# Patient Record
Sex: Male | Born: 1935 | Race: White | Hispanic: No | Marital: Married | Smoking: Former smoker
Health system: Southern US, Community
[De-identification: ages and names within clinical notes are randomized; demographics above are authoritative.]

## PROBLEM LIST (undated history)

## (undated) DIAGNOSIS — I10 Essential (primary) hypertension: Secondary | ICD-10-CM

## (undated) DIAGNOSIS — J449 Chronic obstructive pulmonary disease, unspecified: Secondary | ICD-10-CM

## (undated) DIAGNOSIS — F0390 Unspecified dementia without behavioral disturbance: Secondary | ICD-10-CM

## (undated) DIAGNOSIS — C801 Malignant (primary) neoplasm, unspecified: Secondary | ICD-10-CM

## (undated) DIAGNOSIS — C3490 Malignant neoplasm of unspecified part of unspecified bronchus or lung: Secondary | ICD-10-CM

## (undated) DIAGNOSIS — E119 Type 2 diabetes mellitus without complications: Secondary | ICD-10-CM

## (undated) DIAGNOSIS — R1310 Dysphagia, unspecified: Secondary | ICD-10-CM

## (undated) DIAGNOSIS — I4892 Unspecified atrial flutter: Secondary | ICD-10-CM

## (undated) DIAGNOSIS — R4182 Altered mental status, unspecified: Secondary | ICD-10-CM

---

## 2004-01-15 ENCOUNTER — Ambulatory Visit: Payer: Self-pay | Admitting: Oncology

## 2004-03-03 ENCOUNTER — Ambulatory Visit: Payer: Self-pay | Admitting: Oncology

## 2004-09-01 ENCOUNTER — Ambulatory Visit: Payer: Self-pay | Admitting: Oncology

## 2004-09-13 ENCOUNTER — Ambulatory Visit: Payer: Self-pay | Admitting: Oncology

## 2005-03-06 ENCOUNTER — Ambulatory Visit: Payer: Self-pay | Admitting: Oncology

## 2005-03-10 ENCOUNTER — Ambulatory Visit: Payer: Self-pay | Admitting: Oncology

## 2005-09-27 ENCOUNTER — Ambulatory Visit: Payer: Self-pay | Admitting: Oncology

## 2005-12-28 ENCOUNTER — Ambulatory Visit: Payer: Self-pay | Admitting: Oncology

## 2006-01-01 ENCOUNTER — Ambulatory Visit: Payer: Self-pay | Admitting: Oncology

## 2006-05-25 ENCOUNTER — Ambulatory Visit: Payer: Self-pay | Admitting: Gastroenterology

## 2006-06-14 ENCOUNTER — Ambulatory Visit: Payer: Self-pay | Admitting: Oncology

## 2006-07-05 ENCOUNTER — Ambulatory Visit: Payer: Self-pay | Admitting: Oncology

## 2006-07-15 ENCOUNTER — Ambulatory Visit: Payer: Self-pay | Admitting: Oncology

## 2011-12-15 ENCOUNTER — Ambulatory Visit: Payer: Self-pay | Admitting: Hematology and Oncology

## 2011-12-15 LAB — CBC
HCT: 41.6 % (ref 40.0–52.0)
HGB: 13.7 g/dL (ref 13.0–18.0)
MCH: 29.9 pg (ref 26.0–34.0)
MCV: 91 fL (ref 80–100)
Platelet: 243 10*3/uL (ref 150–440)
RBC: 4.57 10*6/uL (ref 4.40–5.90)
RDW: 14.3 % (ref 11.5–14.5)
WBC: 8.7 10*3/uL (ref 3.8–10.6)

## 2011-12-15 LAB — ETHANOL
Ethanol %: <0.003 % (ref 0.000–0.080)
Ethanol: <3 mg/dL

## 2011-12-15 LAB — URINALYSIS, COMPLETE
Blood: NEGATIVE
Ketone: NEGATIVE
Ph: 6 (ref 4.5–8.0)
Protein: NEGATIVE
RBC,UR: 2 /HPF (ref 0–5)
Specific Gravity: 1.014 (ref 1.003–1.030)
Squamous Epithelial: NONE SEEN

## 2011-12-15 LAB — TSH: Thyroid Stimulating Horm: 1.93 u[IU]/mL

## 2011-12-15 LAB — COMPREHENSIVE METABOLIC PANEL
Albumin: 4 g/dL (ref 3.4–5.0)
Anion Gap: 9 (ref 7–16)
BUN: 14 mg/dL (ref 7–18)
Calcium, Total: 9.1 mg/dL (ref 8.5–10.1)
Chloride: 106 mmol/L (ref 98–107)
EGFR (African American): >60
Osmolality: 283 (ref 275–301)
Potassium: 3.7 mmol/L (ref 3.5–5.1)
SGOT(AST): 24 U/L (ref 15–37)

## 2011-12-15 LAB — DRUG SCREEN, URINE
Barbiturates, Ur Screen: NEGATIVE (ref ?–200)
Benzodiazepine, Ur Scrn: NEGATIVE (ref ?–200)
Cocaine Metabolite,Ur ~~LOC~~: NEGATIVE (ref ?–300)
Methadone, Ur Screen: NEGATIVE (ref ?–300)
Tricyclic, Ur Screen: NEGATIVE (ref ?–1000)

## 2011-12-16 ENCOUNTER — Inpatient Hospital Stay: Payer: Self-pay | Admitting: Internal Medicine

## 2012-01-14 ENCOUNTER — Ambulatory Visit: Payer: Self-pay | Admitting: Hematology and Oncology

## 2012-09-20 ENCOUNTER — Inpatient Hospital Stay (HOSPITAL_COMMUNITY)
Admission: EM | Admit: 2012-09-20 | Discharge: 2012-09-27 | DRG: 177 | Disposition: A | Discharge status: Skilled Nursing Facility | Payer: Medicare Other | Attending: Internal Medicine | Admitting: Internal Medicine

## 2012-09-20 ENCOUNTER — Encounter (HOSPITAL_COMMUNITY): Payer: Self-pay | Admitting: Emergency Medicine

## 2012-09-20 ENCOUNTER — Emergency Department (HOSPITAL_COMMUNITY)
Admission: EM | Admit: 2012-09-20 | Discharge: 2012-09-20 | Disposition: A | Discharge status: Home / Self Care | Payer: Medicare Other | Attending: Emergency Medicine | Admitting: Emergency Medicine

## 2012-09-20 ENCOUNTER — Encounter (HOSPITAL_COMMUNITY): Payer: Self-pay

## 2012-09-20 ENCOUNTER — Emergency Department (HOSPITAL_COMMUNITY): Payer: Medicare Other

## 2012-09-20 DIAGNOSIS — Y9210 Unspecified residential institution as the place of occurrence of the external cause: Secondary | ICD-10-CM | POA: Insufficient documentation

## 2012-09-20 DIAGNOSIS — S1093XA Contusion of unspecified part of neck, initial encounter: Secondary | ICD-10-CM | POA: Insufficient documentation

## 2012-09-20 DIAGNOSIS — R627 Adult failure to thrive: Secondary | ICD-10-CM | POA: Diagnosis present

## 2012-09-20 DIAGNOSIS — J690 Pneumonitis due to inhalation of food and vomit: Secondary | ICD-10-CM

## 2012-09-20 DIAGNOSIS — R918 Other nonspecific abnormal finding of lung field: Secondary | ICD-10-CM | POA: Diagnosis present

## 2012-09-20 DIAGNOSIS — W010XXA Fall on same level from slipping, tripping and stumbling without subsequent striking against object, initial encounter: Secondary | ICD-10-CM | POA: Diagnosis present

## 2012-09-20 DIAGNOSIS — Y92129 Unspecified place in nursing home as the place of occurrence of the external cause: Secondary | ICD-10-CM

## 2012-09-20 DIAGNOSIS — J189 Pneumonia, unspecified organism: Secondary | ICD-10-CM | POA: Diagnosis present

## 2012-09-20 DIAGNOSIS — I10 Essential (primary) hypertension: Secondary | ICD-10-CM | POA: Insufficient documentation

## 2012-09-20 DIAGNOSIS — D649 Anemia, unspecified: Secondary | ICD-10-CM | POA: Diagnosis present

## 2012-09-20 DIAGNOSIS — F03918 Unspecified dementia, unspecified severity, with other behavioral disturbance: Secondary | ICD-10-CM | POA: Diagnosis present

## 2012-09-20 DIAGNOSIS — E119 Type 2 diabetes mellitus without complications: Secondary | ICD-10-CM | POA: Diagnosis present

## 2012-09-20 DIAGNOSIS — S0003XA Contusion of scalp, initial encounter: Secondary | ICD-10-CM | POA: Insufficient documentation

## 2012-09-20 DIAGNOSIS — Z8659 Personal history of other mental and behavioral disorders: Secondary | ICD-10-CM | POA: Insufficient documentation

## 2012-09-20 DIAGNOSIS — R0603 Acute respiratory distress: Secondary | ICD-10-CM | POA: Diagnosis present

## 2012-09-20 DIAGNOSIS — J9600 Acute respiratory failure, unspecified whether with hypoxia or hypercapnia: Secondary | ICD-10-CM | POA: Diagnosis present

## 2012-09-20 DIAGNOSIS — E1140 Type 2 diabetes mellitus with diabetic neuropathy, unspecified: Secondary | ICD-10-CM | POA: Diagnosis present

## 2012-09-20 DIAGNOSIS — J4489 Other specified chronic obstructive pulmonary disease: Secondary | ICD-10-CM | POA: Insufficient documentation

## 2012-09-20 DIAGNOSIS — Z87891 Personal history of nicotine dependence: Secondary | ICD-10-CM

## 2012-09-20 DIAGNOSIS — J9601 Acute respiratory failure with hypoxia: Secondary | ICD-10-CM | POA: Diagnosis present

## 2012-09-20 DIAGNOSIS — T380X5A Adverse effect of glucocorticoids and synthetic analogues, initial encounter: Secondary | ICD-10-CM | POA: Diagnosis present

## 2012-09-20 DIAGNOSIS — Y9389 Activity, other specified: Secondary | ICD-10-CM | POA: Insufficient documentation

## 2012-09-20 DIAGNOSIS — S0083XA Contusion of other part of head, initial encounter: Secondary | ICD-10-CM

## 2012-09-20 DIAGNOSIS — D638 Anemia in other chronic diseases classified elsewhere: Secondary | ICD-10-CM | POA: Diagnosis present

## 2012-09-20 DIAGNOSIS — E1149 Type 2 diabetes mellitus with other diabetic neurological complication: Secondary | ICD-10-CM | POA: Diagnosis present

## 2012-09-20 DIAGNOSIS — E876 Hypokalemia: Secondary | ICD-10-CM | POA: Diagnosis present

## 2012-09-20 DIAGNOSIS — R0902 Hypoxemia: Secondary | ICD-10-CM | POA: Insufficient documentation

## 2012-09-20 DIAGNOSIS — J449 Chronic obstructive pulmonary disease, unspecified: Secondary | ICD-10-CM | POA: Insufficient documentation

## 2012-09-20 DIAGNOSIS — Z85118 Personal history of other malignant neoplasm of bronchus and lung: Secondary | ICD-10-CM | POA: Insufficient documentation

## 2012-09-20 DIAGNOSIS — D72829 Elevated white blood cell count, unspecified: Secondary | ICD-10-CM | POA: Diagnosis present

## 2012-09-20 DIAGNOSIS — F0391 Unspecified dementia with behavioral disturbance: Secondary | ICD-10-CM | POA: Diagnosis present

## 2012-09-20 DIAGNOSIS — F0390 Unspecified dementia without behavioral disturbance: Secondary | ICD-10-CM | POA: Insufficient documentation

## 2012-09-20 DIAGNOSIS — W07XXXA Fall from chair, initial encounter: Secondary | ICD-10-CM | POA: Insufficient documentation

## 2012-09-20 DIAGNOSIS — E43 Unspecified severe protein-calorie malnutrition: Secondary | ICD-10-CM | POA: Diagnosis present

## 2012-09-20 DIAGNOSIS — C3410 Malignant neoplasm of upper lobe, unspecified bronchus or lung: Secondary | ICD-10-CM | POA: Diagnosis present

## 2012-09-20 DIAGNOSIS — E1142 Type 2 diabetes mellitus with diabetic polyneuropathy: Secondary | ICD-10-CM | POA: Diagnosis present

## 2012-09-20 DIAGNOSIS — Z66 Do not resuscitate: Secondary | ICD-10-CM | POA: Diagnosis present

## 2012-09-20 HISTORY — DX: Essential (primary) hypertension: I10

## 2012-09-20 HISTORY — DX: Malignant neoplasm of unspecified part of unspecified bronchus or lung: C34.90

## 2012-09-20 HISTORY — DX: Type 2 diabetes mellitus without complications: E11.9

## 2012-09-20 HISTORY — DX: Malignant (primary) neoplasm, unspecified: C80.1

## 2012-09-20 HISTORY — DX: Chronic obstructive pulmonary disease, unspecified: J44.9

## 2012-09-20 HISTORY — DX: Altered mental status, unspecified: R41.82

## 2012-09-20 LAB — CBC WITH DIFFERENTIAL/PLATELET
Basophils Absolute: 0 10*3/uL (ref 0.0–0.1)
Basophils Absolute: 0 10*3/uL (ref 0.0–0.1)
Basophils Relative: 0 % (ref 0–1)
Basophils Relative: 0 % (ref 0–1)
Basophils Relative: 0 % (ref 0–1)
Eosinophils Absolute: 0 10*3/uL (ref 0.0–0.7)
HCT: 33.7 % — ABNORMAL LOW (ref 39.0–52.0)
Hemoglobin: 11.7 g/dL — ABNORMAL LOW (ref 13.0–17.0)
Hemoglobin: 11.4 g/dL — ABNORMAL LOW (ref 13.0–17.0)
Hemoglobin: 11.5 g/dL — ABNORMAL LOW (ref 13.0–17.0)
Lymphocytes Relative: 4 % — ABNORMAL LOW (ref 12–46)
Lymphs Abs: 1.3 10*3/uL (ref 0.7–4.0)
MCH: 30.7 pg (ref 26.0–34.0)
MCHC: 34.7 g/dL (ref 30.0–36.0)
MCHC: 34.3 g/dL (ref 30.0–36.0)
MCHC: 34.1 g/dL (ref 30.0–36.0)
Monocytes Absolute: 2.3 10*3/uL — ABNORMAL HIGH (ref 0.1–1.0)
Monocytes Relative: 12 % (ref 3–12)
Monocytes Relative: 11 % (ref 3–12)
Neutro Abs: 12.2 10*3/uL — ABNORMAL HIGH (ref 1.7–7.7)
Neutro Abs: 15.8 10*3/uL — ABNORMAL HIGH (ref 1.7–7.7)
Neutrophils Relative %: 84 % — ABNORMAL HIGH (ref 43–77)
Neutrophils Relative %: 84 % — ABNORMAL HIGH (ref 43–77)
RBC: 3.74 MIL/uL — ABNORMAL LOW (ref 4.22–5.81)
RDW: 13.8 % (ref 11.5–15.5)
WBC: 18.8 10*3/uL — ABNORMAL HIGH (ref 4.0–10.5)

## 2012-09-20 LAB — URINE MICROSCOPIC-ADD ON

## 2012-09-20 LAB — POCT I-STAT TROPONIN I: Troponin i, poc: 0.08 ng/mL (ref 0.00–0.08)

## 2012-09-20 LAB — COMPREHENSIVE METABOLIC PANEL
ALT: 19 U/L (ref 0–53)
ALT: 18 U/L (ref 0–53)
AST: 19 U/L (ref 0–37)
AST: 18 U/L (ref 0–37)
Albumin: 2.4 g/dL — ABNORMAL LOW (ref 3.5–5.2)
Albumin: 2.4 g/dL — ABNORMAL LOW (ref 3.5–5.2)
Alkaline Phosphatase: 91 U/L (ref 39–117)
Alkaline Phosphatase: 92 U/L (ref 39–117)
BUN: 15 mg/dL (ref 6–23)
CO2: 33 mEq/L — ABNORMAL HIGH (ref 19–32)
Calcium: 8.8 mg/dL (ref 8.4–10.5)
Calcium: 8.9 mg/dL (ref 8.4–10.5)
Chloride: 96 mEq/L (ref 96–112)
Creatinine, Ser: 0.72 mg/dL (ref 0.50–1.35)
GFR calc Af Amer: >90 mL/min (ref >90)
GFR calc non Af Amer: 86 mL/min — ABNORMAL LOW (ref >90)
Glucose, Bld: 207 mg/dL — ABNORMAL HIGH (ref 70–99)
Glucose, Bld: 158 mg/dL — ABNORMAL HIGH (ref 70–99)
Potassium: 3.9 mEq/L (ref 3.5–5.1)
Potassium: 3.6 mEq/L (ref 3.5–5.1)
Sodium: 137 mEq/L (ref 135–145)
Sodium: 138 mEq/L (ref 135–145)
Total Bilirubin: 0.3 mg/dL (ref 0.3–1.2)
Total Protein: 6.5 g/dL (ref 6.0–8.3)
Total Protein: 6.6 g/dL (ref 6.0–8.3)

## 2012-09-20 LAB — PHOSPHORUS: Phosphorus: 2.9 mg/dL (ref 2.3–4.6)

## 2012-09-20 LAB — URINALYSIS, ROUTINE W REFLEX MICROSCOPIC
Glucose, UA: NEGATIVE mg/dL
Ketones, ur: NEGATIVE mg/dL
Leukocytes, UA: NEGATIVE
Nitrite: NEGATIVE
Protein, ur: NEGATIVE mg/dL
Specific Gravity, Urine: 1.024 (ref 1.005–1.030)
pH: 5 (ref 5.0–8.0)

## 2012-09-20 LAB — PROTIME-INR
INR: 1.28 (ref 0.00–1.49)
Prothrombin Time: 15.7 seconds — ABNORMAL HIGH (ref 11.6–15.2)

## 2012-09-20 LAB — BASIC METABOLIC PANEL
Chloride: 95 mEq/L — ABNORMAL LOW (ref 96–112)
GFR calc Af Amer: >90 mL/min (ref >90)
Potassium: 3.3 mEq/L — ABNORMAL LOW (ref 3.5–5.1)

## 2012-09-20 MED ORDER — DEXTROSE 5 % IV SOLN
1.0000 g | Freq: Three times a day (TID) | INTRAVENOUS | Status: DC
  Administered 2012-09-20: 1 g via INTRAVENOUS
  Filled 2012-09-20: qty 1

## 2012-09-20 MED ORDER — FELODIPINE ER 5 MG PO TB24
5.0000 mg | ORAL_TABLET | Freq: Every day | ORAL | Status: DC
  Administered 2012-09-22 – 2012-09-27 (×6): 5 mg via ORAL
  Filled 2012-09-20 (×7): qty 1

## 2012-09-20 MED ORDER — RISAQUAD PO CAPS
1.0000 | ORAL_CAPSULE | Freq: Every day | ORAL | Status: DC
  Administered 2012-09-21 – 2012-09-27 (×6): 1 via ORAL
  Filled 2012-09-20 (×8): qty 1

## 2012-09-20 MED ORDER — MAGNESIUM HYDROXIDE 400 MG/5ML PO SUSP: 30.0000 mL | Freq: Every day | ORAL | Status: DC | PRN

## 2012-09-20 MED ORDER — DEXTROSE 5 % IV SOLN
1.0000 g | Freq: Three times a day (TID) | INTRAVENOUS | Status: DC
  Administered 2012-09-21 – 2012-09-27 (×20): 1 g via INTRAVENOUS
  Filled 2012-09-20 (×23): qty 1

## 2012-09-20 MED ORDER — DONEPEZIL HCL 10 MG PO TABS
10.0000 mg | ORAL_TABLET | Freq: Every day | ORAL | Status: DC
  Administered 2012-09-20 – 2012-09-26 (×7): 10 mg via ORAL
  Filled 2012-09-20 (×8): qty 1

## 2012-09-20 MED ORDER — GUAIFENESIN 100 MG/5ML PO SOLN: 200.0000 mg | Freq: Three times a day (TID) | ORAL | Status: DC | PRN

## 2012-09-20 MED ORDER — HYDROCHLOROTHIAZIDE 25 MG PO TABS
25.0000 mg | ORAL_TABLET | Freq: Every day | ORAL | Status: DC
  Administered 2012-09-21 – 2012-09-27 (×7): 25 mg via ORAL
  Filled 2012-09-20 (×7): qty 1

## 2012-09-20 MED ORDER — SODIUM CHLORIDE 0.9 % IV SOLN
INTRAVENOUS | Status: DC
  Administered 2012-09-20 (×2): via INTRAVENOUS

## 2012-09-20 MED ORDER — ACETAMINOPHEN 650 MG RE SUPP: 650.0000 mg | Freq: Four times a day (QID) | RECTAL | Status: DC | PRN

## 2012-09-20 MED ORDER — ASPIRIN 81 MG PO CHEW
81.0000 mg | CHEWABLE_TABLET | Freq: Every day | ORAL | Status: DC
  Administered 2012-09-21 – 2012-09-27 (×7): 81 mg via ORAL
  Filled 2012-09-20 (×7): qty 1

## 2012-09-20 MED ORDER — VANCOMYCIN HCL IN DEXTROSE 1-5 GM/200ML-% IV SOLN
1000.0000 mg | Freq: Once | INTRAVENOUS | Status: AC
  Administered 2012-09-20: 1000 mg via INTRAVENOUS
  Filled 2012-09-20: qty 200

## 2012-09-20 MED ORDER — ALBUTEROL SULFATE (5 MG/ML) 0.5% IN NEBU: 2.5000 mg | INHALATION_SOLUTION | RESPIRATORY_TRACT | Status: DC | PRN

## 2012-09-20 MED ORDER — MEMANTINE HCL 5 MG PO TABS
5.0000 mg | ORAL_TABLET | Freq: Two times a day (BID) | ORAL | Status: DC
  Administered 2012-09-20 – 2012-09-27 (×13): 5 mg via ORAL
  Filled 2012-09-20 (×15): qty 1

## 2012-09-20 MED ORDER — DEXTROSE 5 % IV SOLN
500.0000 mg | INTRAVENOUS | Status: DC
  Administered 2012-09-20: 500 mg via INTRAVENOUS
  Filled 2012-09-20: qty 500

## 2012-09-20 MED ORDER — ALBUTEROL SULFATE HFA 108 (90 BASE) MCG/ACT IN AERS: 1.0000 | INHALATION_SPRAY | RESPIRATORY_TRACT | Status: DC | PRN

## 2012-09-20 MED ORDER — ONDANSETRON HCL 4 MG PO TABS: 4.0000 mg | ORAL_TABLET | Freq: Four times a day (QID) | ORAL | Status: DC | PRN

## 2012-09-20 MED ORDER — HALOPERIDOL 0.5 MG PO TABS
0.5000 mg | ORAL_TABLET | Freq: Two times a day (BID) | ORAL | Status: DC
  Administered 2012-09-20 – 2012-09-27 (×13): 0.5 mg via ORAL
  Filled 2012-09-20 (×17): qty 1

## 2012-09-20 MED ORDER — LOPERAMIDE HCL 2 MG PO CAPS: 2.0000 mg | ORAL_CAPSULE | Freq: Four times a day (QID) | ORAL | Status: DC | PRN

## 2012-09-20 MED ORDER — CITALOPRAM HYDROBROMIDE 20 MG PO TABS
20.0000 mg | ORAL_TABLET | Freq: Every day | ORAL | Status: DC
  Administered 2012-09-21 – 2012-09-27 (×7): 20 mg via ORAL
  Filled 2012-09-20 (×7): qty 1

## 2012-09-20 MED ORDER — ONDANSETRON HCL 4 MG/2ML IJ SOLN: 4.0000 mg | Freq: Four times a day (QID) | INTRAMUSCULAR | Status: DC | PRN

## 2012-09-20 MED ORDER — IPRATROPIUM BROMIDE 0.02 % IN SOLN: 0.5000 mg | RESPIRATORY_TRACT | Status: DC | PRN

## 2012-09-20 MED ORDER — ALBUTEROL SULFATE HFA 108 (90 BASE) MCG/ACT IN AERS
1.0000 | INHALATION_SPRAY | RESPIRATORY_TRACT | Status: DC | PRN
  Administered 2012-09-20: 2 via RESPIRATORY_TRACT
  Filled 2012-09-20: qty 6.7

## 2012-09-20 MED ORDER — ACETAMINOPHEN 325 MG PO TABS: 650.0000 mg | ORAL_TABLET | Freq: Four times a day (QID) | ORAL | Status: DC | PRN

## 2012-09-20 MED ORDER — METFORMIN HCL 500 MG PO TABS
500.0000 mg | ORAL_TABLET | Freq: Every day | ORAL | Status: DC
  Administered 2012-09-21 – 2012-09-27 (×2): 500 mg via ORAL
  Filled 2012-09-20 (×8): qty 1

## 2012-09-20 MED ORDER — HYDROCODONE-ACETAMINOPHEN 5-325 MG PO TABS
1.0000 | ORAL_TABLET | ORAL | Status: DC | PRN
  Administered 2012-09-20: 1 via ORAL
  Administered 2012-09-21: 2 via ORAL
  Filled 2012-09-20: qty 2
  Filled 2012-09-20: qty 1

## 2012-09-20 MED ORDER — METHYLPREDNISOLONE SODIUM SUCC 125 MG IJ SOLR
60.0000 mg | Freq: Every day | INTRAMUSCULAR | Status: DC
  Administered 2012-09-20 – 2012-09-21 (×2): 60 mg via INTRAVENOUS
  Filled 2012-09-20 (×2): qty 0.96

## 2012-09-20 MED ORDER — MEMANTINE HCL ER 14 MG PO CP24: 14.0000 mg | ORAL_CAPSULE | Freq: Every day | ORAL | Status: DC

## 2012-09-20 MED ORDER — DEXTROSE 5 % IV SOLN
1.0000 g | INTRAVENOUS | Status: DC
  Administered 2012-09-20: 1 g via INTRAVENOUS
  Filled 2012-09-20: qty 10

## 2012-09-20 MED ORDER — DIVALPROEX SODIUM ER 500 MG PO TB24
500.0000 mg | ORAL_TABLET | Freq: Two times a day (BID) | ORAL | Status: DC
  Administered 2012-09-20 – 2012-09-25 (×10): 500 mg via ORAL
  Filled 2012-09-20 (×13): qty 1

## 2012-09-20 MED ORDER — SODIUM CHLORIDE 0.9 % IV SOLN
INTRAVENOUS | Status: DC
  Administered 2012-09-20 – 2012-09-21 (×2): 75 mL/h via INTRAVENOUS
  Administered 2012-09-21: 14:00:00 via INTRAVENOUS

## 2012-09-20 MED ORDER — VANCOMYCIN HCL IN DEXTROSE 1-5 GM/200ML-% IV SOLN
1000.0000 mg | Freq: Two times a day (BID) | INTRAVENOUS | Status: DC
  Administered 2012-09-21 – 2012-09-27 (×13): 1000 mg via INTRAVENOUS
  Filled 2012-09-20 (×15): qty 200

## 2012-09-20 MED ORDER — GABAPENTIN 100 MG PO CAPS
100.0000 mg | ORAL_CAPSULE | Freq: Three times a day (TID) | ORAL | Status: DC
  Administered 2012-09-20 – 2012-09-27 (×19): 100 mg via ORAL
  Filled 2012-09-20 (×23): qty 1

## 2012-09-20 MED ORDER — ACETAMINOPHEN 325 MG PO TABS
650.0000 mg | ORAL_TABLET | Freq: Once | ORAL | Status: AC
  Administered 2012-09-20: 650 mg via ORAL
  Filled 2012-09-20: qty 2

## 2012-09-20 MED ORDER — TETANUS-DIPHTH-ACELL PERTUSSIS 5-2.5-18.5 LF-MCG/0.5 IM SUSP: 0.5000 mL | Freq: Once | INTRAMUSCULAR | Status: DC

## 2012-09-20 NOTE — Progress Notes (Signed)
ANTIBIOTIC CONSULT NOTE - INITIAL  Pharmacy Consult for:  Vancomycin Indication:  Suspected pneumonia (HCAP)  Allergies  Allergen Reactions  . Ativan (Lorazepam)     Patient Measurements: Height: 5\' 9"  (175.3 cm) Weight: 162 lb 14.7 oz (73.9 kg) IBW/kg (Calculated) : 70.7  Vital Signs: Temp: 99 F (37.2 C) (08/08 2044) Temp src: Oral (08/08 2044) BP: 137/59 mmHg (08/08 2044) Pulse Rate: 98 (08/08 2044)   Labs:  Recent Labs  09/20/12 0410 09/20/12 1510 09/20/12 2010  WBC 14.5* 18.8* 20.9*  HGB 11.7* 11.4* 11.5*  PLT 410* 453* 433*  CREATININE 0.82 0.83 0.72   Estimated Creatinine Clearance: 73.6 ml/min (by C-G formula based on Cr of 0.72).    Medical History: Past Medical History  Diagnosis Date  . Altered mental status   . COPD (chronic obstructive pulmonary disease)   . Diabetes mellitus without complication   . Hypertension   . Cancer   . Lung cancer     Medications:  Scheduled:  . [START ON 09/21/2012] acidophilus  1 capsule Oral Daily  . [START ON 09/21/2012] aspirin  81 mg Oral Daily  . [START ON 09/21/2012] citalopram  20 mg Oral Daily  . divalproex  500 mg Oral BID  . donepezil  10 mg Oral QHS  . [START ON 09/21/2012] felodipine  5 mg Oral Daily  . gabapentin  100 mg Oral TID  . haloperidol  0.5 mg Oral BID  . [START ON 09/21/2012] hydrochlorothiazide  25 mg Oral Daily  . memantine  5 mg Oral BID  . [START ON 09/21/2012] metFORMIN  500 mg Oral Q breakfast  . methylPREDNISolone (SOLU-MEDROL) injection  60 mg Intravenous Daily   Assessment: Asked to assist with Vancomycin therapy for this 79 year-old male with suspected healthcare-associated pneumonia.  Goal of Therapy:  Vancomycin trough levels 15-20 mcg/ml Eradication of infection  Plan:   Vancomycin 1000 mg IV every 12 hours  Levels as needed to guide dosing decisions   [employer] R.Ph. 09/20/2012 9:16 PM

## 2012-09-20 NOTE — ED Notes (Addendum)
Report called to facility

## 2012-09-20 NOTE — H&P (Signed)
[facility] History and Physical  Joel Summers ZOX:[id number] DOB: January 10, 1931 DOA: 09/20/2012  Referring physician: ER physician PCP: Ron Parker, MD   Chief Complaint: shortness of breath  HPI:  79 year old male with past medical history of hypertension, dementia, COPD, diabetes who presented to [facility] ED from SNF with worsening shortness of breath with productive cough. Patient is unable to provide the details of his medical history due to dementia. No reports of fevers. No reports of vomiting, blood in stool or urine. No reports of hematemesis or weight loss. In ED, vital signs are stable with BP 135/94, O2 saturation 97% on room air and T 99.6 F. CXR revealed right upper lung lobe mass consistent with history of lung cancer. No preevios CXR available in EPIC for comparison. No previous records of history of lung cancer or treatments. CBC revealed WBC count of 14.5 and 18.8, hemoglobin of 11.7. BMP revealed potassium of 3.3 which normalized spontaneously to 3.9. CT head and cervical spine showed no acute intracranial findings.   Assessment and Plan:  Principal Problem:   *Acute respiratory distress - not hypoxic, saturation is 97% on room air but he does appear to have mild shortness of breath - likely due to HCAP or aspiration pneumonia - start vanco and cefepime - start albuterol and Atrovent nebulizers as needed every 2 hours - solumedrol 60 mg daily IV - oxygen support via nasal canula to keep O2 saturation above 90%  COPD (chronic obstructive pulmonary disease) - COPD order set in place - management as above with steroids, nebulizer treatments and antibiotics   HCAP (healthcare-associated pneumonia) - pneumonia order set in place - follow up blood and resp culture results, legionella and strep pneumonia - continue vanco and cefepime Active Problems:   Anemia - stable hemoglobin at 11.7 - no indications for transfusion   Hypokalemia - resolved to 3.9 without  supplementation    Leukocytosis - likely due to steroids and pneumonia - continue to monitor   Dementia - continue aricept and namenda   Lung mass - right upper lung lobe - will consult oncology in am   DM (diabetes mellitus) - continue metformin 500 mg daily   HTN (hypertension) - continue Hctz   Manson Passey [facility] [phone]  Review of Systems:  Unable to obtain due to dementia.   Past Medical History  Diagnosis Date  . Altered mental status   . COPD (chronic obstructive pulmonary disease)   . Diabetes mellitus without complication   . Hypertension   . Cancer   . Lung cancer    History reviewed. No pertinent past surgical history. Social History:  reports that he does not drink alcohol. His tobacco and drug histories are not on file.  Allergies  Allergen Reactions  . Ativan (Lorazepam)     Family History: unable to obtain due to patient's dementia  Prior to Admission medications   Medication Sig Start Date End Date Taking? Authorizing Provider  acetaminophen (TYLENOL) 500 MG tablet Take 500 mg by mouth every 6 (six) hours as needed for pain.   Yes Historical Provider, MD  acidophilus (RISAQUAD) CAPS capsule Take 1 capsule by mouth daily. For 20 days   Yes Historical Provider, MD  albuterol (PROVENTIL HFA;VENTOLIN HFA) 108 (90 BASE) MCG/ACT inhaler Inhale 1-2 puffs into the lungs every 4 (four) hours as needed for wheezing or shortness of breath. 09/20/12  Yes Olivia Mackie, MD  albuterol (PROVENTIL) 2 MG tablet Take 2 mg by mouth 3 (three) times daily.  For 5 days   Yes Historical Provider, MD  amoxicillin-clavulanate (AUGMENTIN) 500-125 MG per tablet Take 1 tablet by mouth 3 (three) times daily. For 10 days only   Yes Historical Provider, MD  aspirin 81 MG chewable tablet Chew 81 mg by mouth daily.   Yes Historical Provider, MD  citalopram (CELEXA) 20 MG tablet Take 20 mg by mouth daily.   Yes Historical Provider, MD  divalproex (DEPAKOTE ER) 500 MG 24 hr tablet Take 500  mg by mouth 2 (two) times daily.   Yes Historical Provider, MD  donepezil (ARICEPT) 10 MG tablet Take 10 mg by mouth at bedtime.   Yes Historical Provider, MD  felodipine (PLENDIL) 5 MG 24 hr tablet Take 5 mg by mouth daily.   Yes Historical Provider, MD  gabapentin (NEURONTIN) 100 MG capsule Take 100 mg by mouth 3 (three) times daily.   Yes Historical Provider, MD  guaiFENesin (ROBITUSSIN) 100 MG/5ML liquid Take 200 mg by mouth 3 (three) times daily as needed for cough.   Yes Historical Provider, MD  haloperidol (HALDOL) 0.5 MG tablet Take 0.5 mg by mouth 2 (two) times daily.   Yes Historical Provider, MD  hydrochlorothiazide (HYDRODIURIL) 25 MG tablet Take 25 mg by mouth daily.   Yes Historical Provider, MD  levofloxacin (LEVAQUIN) 500 MG tablet Take 500 mg by mouth daily. For 10 days only   Yes Historical Provider, MD  loperamide (IMODIUM A-D) 2 MG tablet Take 2 mg by mouth 4 (four) times daily as needed for diarrhea or loose stools.   Yes Historical Provider, MD  magnesium hydroxide (MILK OF MAGNESIA) 400 MG/5ML suspension Take 30 mLs by mouth daily as needed for constipation.   Yes Historical Provider, MD  Memantine HCl ER (NAMENDA XR) 14 MG CP24 Take by mouth.   Yes Historical Provider, MD  metFORMIN (GLUCOPHAGE) 500 MG tablet Take 500 mg by mouth daily with breakfast.   Yes Historical Provider, MD  predniSONE (DELTASONE) 20 MG tablet Take 20 mg by mouth daily. For 5 days   Yes Historical Provider, MD   Physical Exam: Filed Vitals:   09/20/12 1402 09/20/12 1706  BP: 120/42 117/69  Pulse: 88 101  Temp: 99.6 F (37.6 C)   TempSrc: Oral   Resp: 21 26  SpO2: 92% 93%    Physical Exam  Constitutional: Appears ill. No distress.  HENT: Normocephalic. Dry mucus membranes Eyes: Conjunctivae and EOM are normal. PERRLA, no scleral icterus.  Neck: Normal ROM. Neck supple. No JVD. No tracheal deviation. No thyromegaly.  CVS: RRR, S1/S2 appreciated Pulmonary: diminished breath sounds  bilateral, rhonchi in right upper lung lobe Abdominal: Soft. BS +,  no distension, tenderness, rebound or guarding.  Musculoskeletal: Normal range of motion. No edema and no tenderness.  Lymphadenopathy: No lymphadenopathy noted, cervical, inguinal. Neuro: Alert. Not oriented, no focal neurologic deficits Skin: Skin is warm and dry.     Labs on Admission:  Basic Metabolic Panel:  Recent Labs Lab 09/20/12 0410 09/20/12 1510  NA 136 137  K 3.3* 3.9  CL 95* 95*  CO2 30 31  GLUCOSE 188* 207*  BUN 18 18  CREATININE 0.82 0.83  CALCIUM 8.8 8.8   Liver Function Tests:  Recent Labs Lab 09/20/12 1510  AST 19  ALT 19  ALKPHOS 91  BILITOT 0.5  PROT 6.5  ALBUMIN 2.4*   No results found for this basename: LIPASE, AMYLASE,  in the last 168 hours No results found for this basename: AMMONIA,  in the  last 168 hours CBC:  Recent Labs Lab 09/20/12 0410 09/20/12 1510  WBC 14.5* 18.8*  NEUTROABS 12.2* 15.8*  HGB 11.7* 11.4*  HCT 33.7* 33.2*  MCV 89.2 89.5  PLT 410* 453*   Cardiac Enzymes: No results found for this basename: CKTOTAL, CKMB, CKMBINDEX, TROPONINI,  in the last 168 hours BNP: No components found with this basename: POCBNP,  CBG: No results found for this basename: GLUCAP,  in the last 168 hours  Radiological Exams on Admission: Dg Chest 2 View  09/20/2012   *RADIOLOGY REPORT*  Clinical Data: Fall, weakness, unable to walk well, the kidney, cough, history lung cancer, hypertension, COPD  CHEST - 2 VIEW  Comparison: Exam at 1603 hours compared to earlier study of 0425 hours  Findings: Mild volume loss in right hemithorax again noted. Enlargement of cardiac silhouette with tortuous aorta. Pulmonary vascular congestion. Right perihilar opacity compatible with tumor and post-treatment changes. Bibasilar atelectasis versus infiltrates with small right pleural effusion. No pneumothorax. Bones demineralized.  IMPRESSION: Right perihilar opacity consistent with lung cancer and  post- therapy changes. COPD changes with bibasilar atelectasis versus infiltrates and small right pleural effusion.   Original Report Authenticated By: Ulyses Southward, M.D.   Dg Chest 2 View  09/20/2012   *RADIOLOGY REPORT*  Clinical Data: Status post fall.  Chest pain.  History of lung cancer.  CHEST - 2 VIEW  Comparison: None.  Findings: There is volume loss in the right chest with a mass-like opacity in the right upper lobe.  Blunting of the right costophrenic angle could be due to a small effusion or scar. Surgical clips right hilum noted.  The left lung appears clear. Heart size is normal.  IMPRESSION: Post-treatment change right chest with a mass-like opacity right upper lobe compatible with history of lung cancer.  Small right pleural effusion.   Original Report Authenticated By: Holley Dexter, M.D.   Ct Head Wo Contrast  09/20/2012   *RADIOLOGY REPORT*  Clinical Data:  Status post fall with a blow to the head.  CT HEAD WITHOUT CONTRAST CT CERVICAL SPINE WITHOUT CONTRAST  Technique:  Multidetector CT imaging of the head and cervical spine was performed following the standard protocol without intravenous contrast.  Multiplanar CT image reconstructions of the cervical spine were also generated.  Comparison:  None.  CT HEAD  Findings: The brain is atrophic with chronic microvascular ischemic change.  No acute intracranial abnormality including infarction, hemorrhage, mass lesion, mass effect, midline shift or abnormal extra-axial fluid collection is identified.  Hematoma over the frontal bone is noted.  No fracture is seen.  IMPRESSION:  1.  Hematoma over the frontal bone without underlying fracture or for acute intracranial abnormality. 2.  Atrophy and chronic microvascular ischemic change.  CT CERVICAL SPINE  Findings: No fracture or subluxation of the cervical spine is identified. Loss of disc space height and endplate spurring are most notable at C5-6 and C6-7.  Multilevel facet degenerative change is  worse on the left.  Paraspinous soft tissue structures are unremarkable.  Apical scarring on the right is noted.  IMPRESSION: No acute finding.  Degenerative disc disease C5-6 and C6-7.   Original Report Authenticated By: Holley Dexter, M.D.   Ct Cervical Spine Wo Contrast  09/20/2012   *RADIOLOGY REPORT*  Clinical Data:  Status post fall with a blow to the head.  CT HEAD WITHOUT CONTRAST CT CERVICAL SPINE WITHOUT CONTRAST  Technique:  Multidetector CT imaging of the head and cervical spine was performed following the standard  protocol without intravenous contrast.  Multiplanar CT image reconstructions of the cervical spine were also generated.  Comparison:  None.  CT HEAD  Findings: The brain is atrophic with chronic microvascular ischemic change.  No acute intracranial abnormality including infarction, hemorrhage, mass lesion, mass effect, midline shift or abnormal extra-axial fluid collection is identified.  Hematoma over the frontal bone is noted.  No fracture is seen.  IMPRESSION:  1.  Hematoma over the frontal bone without underlying fracture or for acute intracranial abnormality. 2.  Atrophy and chronic microvascular ischemic change.  CT CERVICAL SPINE  Findings: No fracture or subluxation of the cervical spine is identified. Loss of disc space height and endplate spurring are most notable at C5-6 and C6-7.  Multilevel facet degenerative change is worse on the left.  Paraspinous soft tissue structures are unremarkable.  Apical scarring on the right is noted.  IMPRESSION: No acute finding.  Degenerative disc disease C5-6 and C6-7.   Original Report Authenticated By: Holley Dexter, M.D.    Code Status: Full Family Communication: Pt at bedside Disposition Plan: Admit for further evaluation  Manson Passey, MD  [facility] Pager [phone]  If 7PM-7AM, please contact night-coverage [email or web address] Password [email or web address] 09/20/2012, 5:32 PM

## 2012-09-20 NOTE — ED Notes (Signed)
ZOX:[id number]> Expected date:<BR> Expected time:<BR> Means of arrival:<BR> Comments:<BR> EMS 79yo M; fall out of bed, struck head; hematoma to head; Shob, fever

## 2012-09-20 NOTE — ED Notes (Signed)
[id number]<JY> Expected date:<BR> Expected time:<BR> Means of arrival:<BR> Comments:<BR> Ems/

## 2012-09-20 NOTE — Progress Notes (Signed)
Received patient from ED at around 1830.  Upon arrival, the transporting NT stated that the patient pulled out his IV during the transport.  Patient with a large hematoma to the entire top of his left hand.  No active bleeding noted.  Dr. Izola Price on the unit and notified and she stated she would make Dr. Elisabeth Pigeon aware and she stated to apply ice.  Patient's IV restarted in right arm.  Patient extremely confused and attempting to get OOB.  Bed alarm and other high fall risk prevention measures in place.  Allayne Butcher [facility]  09/20/2012  7:35 PM

## 2012-09-20 NOTE — ED Notes (Signed)
Patient fell 0230 this morning and came to [facility] then got discharge back to [facility] nursing facility with antibiotic. Pt fell again for the second time at the nursing home. Feels weak, unable to walk well, urinated on himself, incontinence is new today. Temp was 99.6. Sat 85% RA, pt placed on oxygen. Pt has no complaint. Hematoma on left forehead with abrasion. Tachypnea, and coughing noted. Lungs are clear

## 2012-09-20 NOTE — ED Notes (Signed)
Pt slid out of chair this morning and fell face forward denies loc. Pt has hematoma to center of forehead. Pt tachycardiac with difficulty breathing.

## 2012-09-20 NOTE — ED Provider Notes (Signed)
CSN: [id number]     Arrival date & time 09/20/12  1339 History     First MD Initiated Contact with Patient 09/20/12 1502     Chief Complaint  Patient presents with  . Fall   (Consider location/radiation/quality/duration/timing/severity/associated sxs/prior Treatment) Patient is a 79 y.o. male presenting with fall. The history is provided by the patient. The history is limited by the condition of the patient.  Fall   patient here after being found on the ground nursing home in his room. Seen here earlier this morning diagnosis COPD exacerbation and placed on antibiotics. Patient also had a head CT neck CT which are negative. Patient continues to be short of breath with productive cough. No new evidence of head or neck trauma. Patient has a history of dementia therefore the history is limited. Patient presents via EMS. Was given supplemental oxygen prior to arrival  Past Medical History  Diagnosis Date  . Altered mental status   . COPD (chronic obstructive pulmonary disease)   . Diabetes mellitus without complication   . Hypertension   . Cancer   . Lung cancer    History reviewed. No pertinent past surgical history. No family history on file. History  Substance Use Topics  . Smoking status: Not on file  . Smokeless tobacco: Not on file  . Alcohol Use: No    Review of Systems  Unable to perform ROS   Allergies  Ativan  Home Medications   Current Outpatient Rx  Name  Route  Sig  Dispense  Refill  . acetaminophen (TYLENOL) 500 MG tablet   Oral   Take 500 mg by mouth every 6 (six) hours as needed for pain.         Marland Kitchen acidophilus (RISAQUAD) CAPS capsule   Oral   Take 1 capsule by mouth daily. For 20 days         . albuterol (PROVENTIL HFA;VENTOLIN HFA) 108 (90 BASE) MCG/ACT inhaler   Inhalation   Inhale 1-2 puffs into the lungs every 4 (four) hours as needed for wheezing or shortness of breath.   1 Inhaler   1   . albuterol (PROVENTIL) 2 MG tablet   Oral   Take  2 mg by mouth 3 (three) times daily. For 5 days         . amoxicillin-clavulanate (AUGMENTIN) 500-125 MG per tablet   Oral   Take 1 tablet by mouth 3 (three) times daily. For 10 days only         . aspirin 81 MG chewable tablet   Oral   Chew 81 mg by mouth daily.         . citalopram (CELEXA) 20 MG tablet   Oral   Take 20 mg by mouth daily.         . divalproex (DEPAKOTE ER) 500 MG 24 hr tablet   Oral   Take 500 mg by mouth 2 (two) times daily.         Marland Kitchen donepezil (ARICEPT) 10 MG tablet   Oral   Take 10 mg by mouth at bedtime.         . felodipine (PLENDIL) 5 MG 24 hr tablet   Oral   Take 5 mg by mouth daily.         Marland Kitchen gabapentin (NEURONTIN) 100 MG capsule   Oral   Take 100 mg by mouth 3 (three) times daily.         Marland Kitchen guaiFENesin (ROBITUSSIN) 100 MG/5ML liquid  Oral   Take 200 mg by mouth 3 (three) times daily as needed for cough.         . haloperidol (HALDOL) 0.5 MG tablet   Oral   Take 0.5 mg by mouth 2 (two) times daily.         . hydrochlorothiazide (HYDRODIURIL) 25 MG tablet   Oral   Take 25 mg by mouth daily.         Marland Kitchen levofloxacin (LEVAQUIN) 500 MG tablet   Oral   Take 500 mg by mouth daily. For 10 days only         . loperamide (IMODIUM A-D) 2 MG tablet   Oral   Take 2 mg by mouth 4 (four) times daily as needed for diarrhea or loose stools.         . magnesium hydroxide (MILK OF MAGNESIA) 400 MG/5ML suspension   Oral   Take 30 mLs by mouth daily as needed for constipation.         . Memantine HCl ER (NAMENDA XR) 14 MG CP24   Oral   Take by mouth.         . metFORMIN (GLUCOPHAGE) 500 MG tablet   Oral   Take 500 mg by mouth daily with breakfast.         . predniSONE (DELTASONE) 20 MG tablet   Oral   Take 20 mg by mouth daily. For 5 days          BP 120/42  Pulse 88  Temp(Src) 99.6 F (37.6 C) (Oral)  Resp 21  SpO2 92% Physical Exam  Nursing note and vitals reviewed. Constitutional: He appears  well-developed and well-nourished.  Non-toxic appearance. No distress.  HENT:  Head: Normocephalic and atraumatic.  Eyes: Conjunctivae, EOM and lids are normal. Pupils are equal, round, and reactive to light.  Neck: Normal range of motion. Neck supple. No tracheal deviation present. No mass present.  Cardiovascular: Regular rhythm and normal heart sounds.  Tachycardia present.  Exam reveals no gallop.   No murmur heard. Pulmonary/Chest: Effort normal. No stridor. No respiratory distress. He has decreased breath sounds. He has wheezes. He has no rhonchi. He has no rales.  Abdominal: Soft. Normal appearance and bowel sounds are normal. He exhibits no distension. There is no tenderness. There is no rebound and no CVA tenderness.  Musculoskeletal: Normal range of motion. He exhibits no edema and no tenderness.  Neurological: He is alert. He has normal strength. No cranial nerve deficit or sensory deficit. GCS eye subscore is 4. GCS verbal subscore is 5. GCS motor subscore is 6.  Skin: Skin is warm and dry. No abrasion and no rash noted.  Psychiatric: His affect is blunt. His speech is delayed. He is slowed.    ED Course   Procedures (including critical care time)  Labs Reviewed  URINE CULTURE  CBC WITH DIFFERENTIAL  URINALYSIS, ROUTINE W REFLEX MICROSCOPIC  COMPREHENSIVE METABOLIC PANEL   Dg Chest 2 View  09/20/2012   *RADIOLOGY REPORT*  Clinical Data: Status post fall.  Chest pain.  History of lung cancer.  CHEST - 2 VIEW  Comparison: None.  Findings: There is volume loss in the right chest with a mass-like opacity in the right upper lobe.  Blunting of the right costophrenic angle could be due to a small effusion or scar. Surgical clips right hilum noted.  The left lung appears clear. Heart size is normal.  IMPRESSION: Post-treatment change right chest with a mass-like opacity right upper  lobe compatible with history of lung cancer.  Small right pleural effusion.   Original Report Authenticated  By: Holley Dexter, M.D.   Ct Head Wo Contrast  09/20/2012   *RADIOLOGY REPORT*  Clinical Data:  Status post fall with a blow to the head.  CT HEAD WITHOUT CONTRAST CT CERVICAL SPINE WITHOUT CONTRAST  Technique:  Multidetector CT imaging of the head and cervical spine was performed following the standard protocol without intravenous contrast.  Multiplanar CT image reconstructions of the cervical spine were also generated.  Comparison:  None.  CT HEAD  Findings: The brain is atrophic with chronic microvascular ischemic change.  No acute intracranial abnormality including infarction, hemorrhage, mass lesion, mass effect, midline shift or abnormal extra-axial fluid collection is identified.  Hematoma over the frontal bone is noted.  No fracture is seen.  IMPRESSION:  1.  Hematoma over the frontal bone without underlying fracture or for acute intracranial abnormality. 2.  Atrophy and chronic microvascular ischemic change.  CT CERVICAL SPINE  Findings: No fracture or subluxation of the cervical spine is identified. Loss of disc space height and endplate spurring are most notable at C5-6 and C6-7.  Multilevel facet degenerative change is worse on the left.  Paraspinous soft tissue structures are unremarkable.  Apical scarring on the right is noted.  IMPRESSION: No acute finding.  Degenerative disc disease C5-6 and C6-7.   Original Report Authenticated By: Holley Dexter, M.D.   Ct Cervical Spine Wo Contrast  09/20/2012   *RADIOLOGY REPORT*  Clinical Data:  Status post fall with a blow to the head.  CT HEAD WITHOUT CONTRAST CT CERVICAL SPINE WITHOUT CONTRAST  Technique:  Multidetector CT imaging of the head and cervical spine was performed following the standard protocol without intravenous contrast.  Multiplanar CT image reconstructions of the cervical spine were also generated.  Comparison:  None.  CT HEAD  Findings: The brain is atrophic with chronic microvascular ischemic change.  No acute intracranial  abnormality including infarction, hemorrhage, mass lesion, mass effect, midline shift or abnormal extra-axial fluid collection is identified.  Hematoma over the frontal bone is noted.  No fracture is seen.  IMPRESSION:  1.  Hematoma over the frontal bone without underlying fracture or for acute intracranial abnormality. 2.  Atrophy and chronic microvascular ischemic change.  CT CERVICAL SPINE  Findings: No fracture or subluxation of the cervical spine is identified. Loss of disc space height and endplate spurring are most notable at C5-6 and C6-7.  Multilevel facet degenerative change is worse on the left.  Paraspinous soft tissue structures are unremarkable.  Apical scarring on the right is noted.  IMPRESSION: No acute finding.  Degenerative disc disease C5-6 and C6-7.   Original Report Authenticated By: Holley Dexter, M.D.   No diagnosis found.  MDM  Patient started on a Zithromax and Rocephin for community acquired pneumonia. He will be admitted by the hospitalist   Date: 09/20/2012  Rate: 96  Rhythm: normal sinus rhythm  QRS Axis: normal  Intervals: normal  ST/T Wave abnormalities: nonspecific ST changes  Conduction Disutrbances:right bundle branch block  Narrative Interpretation:   Old EKG Reviewed: none available    Toy Baker, MD 09/20/12 1704

## 2012-09-20 NOTE — ED Provider Notes (Signed)
CSN: [id number]     Arrival date & time 09/20/12  0341 History     First MD Initiated Contact with Patient 09/20/12 [phone]     Chief Complaint  Patient presents with  . Fall   (Consider location/radiation/quality/duration/timing/severity/associated sxs/prior Treatment) HPI 79 yo male presents to the ER from nursing facility with reported fall.  Pt has dementia, unable to give any history.  It is reported he slid from his chair and struck his head.  Hematoma noted to forehead.  EMS reports low 02 sat upon their arrival, pt was placed on 02 by nasal cannula.  Pt without prior evaluations in our medical records.  Paperwork from nursing facility shows COPD, lung cancer, encephalopathy, DM, HTN.  It appears he was recently placed on prednisone.   Past Medical History  Diagnosis Date  . Altered mental status   . COPD (chronic obstructive pulmonary disease)   . Diabetes mellitus without complication   . Hypertension   . Cancer   . Lung cancer    History reviewed. No pertinent past surgical history. History reviewed. No pertinent family history. History  Substance Use Topics  . Smoking status: Not on file  . Smokeless tobacco: Not on file  . Alcohol Use: No    Review of Systems  Unable to perform ROS: Dementia    Allergies  Ativan  Home Medications  No current outpatient prescriptions on file. BP 135/94  Pulse 96  Temp(Src) 100 F (37.8 C) (Oral)  Resp 20  SpO2 97% Physical Exam  Nursing note and vitals reviewed. Constitutional: He appears well-developed and well-nourished.  HENT:  Head: Normocephalic.  Right Ear: External ear normal.  Left Ear: External ear normal.  Nose: Nose normal.  Mouth/Throat: Oropharynx is clear and moist.  Large contusion to left forhead  Eyes: Conjunctivae and EOM are normal. Pupils are equal, round, and reactive to light.  Neck: Normal range of motion. Neck supple. No JVD present. No tracheal deviation present. No thyromegaly present.   Cardiovascular: Normal rate, regular rhythm, normal heart sounds and intact distal pulses.  Exam reveals no gallop and no friction rub.   No murmur heard. Pulmonary/Chest: Breath sounds normal. No stridor. No respiratory distress. He has no wheezes. He has no rales. He exhibits no tenderness.  Tachypnea, cough noted  Abdominal: Soft. Bowel sounds are normal. He exhibits no distension and no mass. There is no tenderness. There is no rebound and no guarding.  Musculoskeletal: Normal range of motion. He exhibits no edema and no tenderness.  Toenails extremely long  Lymphadenopathy:    He has no cervical adenopathy.  Neurological: He is alert. He exhibits normal muscle tone. Coordination normal.  Oriented to self, hospital  Skin: Skin is warm and dry. No rash noted. No erythema. No pallor.  Psychiatric: He has a normal mood and affect. His behavior is normal. Judgment and thought content normal.    ED Course   Procedures (including critical care time)  Labs Reviewed  CBC WITH DIFFERENTIAL - Abnormal; Notable for the following:    WBC 14.5 (*)    RBC 3.78 (*)    Hemoglobin 11.7 (*)    HCT 33.7 (*)    Platelets 410 (*)    Neutrophils Relative % 84 (*)    Neutro Abs 12.2 (*)    Lymphocytes Relative 4 (*)    Lymphs Abs 0.6 (*)    Monocytes Absolute 1.8 (*)    All other components within normal limits  BASIC METABOLIC PANEL -  Abnormal; Notable for the following:    Potassium 3.3 (*)    Chloride 95 (*)    Glucose, Bld 188 (*)    GFR calc non Af Amer 81 (*)    All other components within normal limits  URINALYSIS, ROUTINE W REFLEX MICROSCOPIC - Abnormal; Notable for the following:    Hgb urine dipstick TRACE (*)    Bilirubin Urine SMALL (*)    Leukocytes, UA SMALL (*)    All other components within normal limits  URINE MICROSCOPIC-ADD ON  CG4 I-STAT (LACTIC ACID)   Dg Chest 2 View  09/20/2012   *RADIOLOGY REPORT*  Clinical Data: Status post fall.  Chest pain.  History of lung  cancer.  CHEST - 2 VIEW  Comparison: None.  Findings: There is volume loss in the right chest with a mass-like opacity in the right upper lobe.  Blunting of the right costophrenic angle could be due to a small effusion or scar. Surgical clips right hilum noted.  The left lung appears clear. Heart size is normal.  IMPRESSION: Post-treatment change right chest with a mass-like opacity right upper lobe compatible with history of lung cancer.  Small right pleural effusion.   Original Report Authenticated By: Holley Dexter, M.D.   Ct Head Wo Contrast  09/20/2012   *RADIOLOGY REPORT*  Clinical Data:  Status post fall with a blow to the head.  CT HEAD WITHOUT CONTRAST CT CERVICAL SPINE WITHOUT CONTRAST  Technique:  Multidetector CT imaging of the head and cervical spine was performed following the standard protocol without intravenous contrast.  Multiplanar CT image reconstructions of the cervical spine were also generated.  Comparison:  None.  CT HEAD  Findings: The brain is atrophic with chronic microvascular ischemic change.  No acute intracranial abnormality including infarction, hemorrhage, mass lesion, mass effect, midline shift or abnormal extra-axial fluid collection is identified.  Hematoma over the frontal bone is noted.  No fracture is seen.  IMPRESSION:  1.  Hematoma over the frontal bone without underlying fracture or for acute intracranial abnormality. 2.  Atrophy and chronic microvascular ischemic change.  CT CERVICAL SPINE  Findings: No fracture or subluxation of the cervical spine is identified. Loss of disc space height and endplate spurring are most notable at C5-6 and C6-7.  Multilevel facet degenerative change is worse on the left.  Paraspinous soft tissue structures are unremarkable.  Apical scarring on the right is noted.  IMPRESSION: No acute finding.  Degenerative disc disease C5-6 and C6-7.   Original Report Authenticated By: Holley Dexter, M.D.   Ct Cervical Spine Wo  Contrast  09/20/2012   *RADIOLOGY REPORT*  Clinical Data:  Status post fall with a blow to the head.  CT HEAD WITHOUT CONTRAST CT CERVICAL SPINE WITHOUT CONTRAST  Technique:  Multidetector CT imaging of the head and cervical spine was performed following the standard protocol without intravenous contrast.  Multiplanar CT image reconstructions of the cervical spine were also generated.  Comparison:  None.  CT HEAD  Findings: The brain is atrophic with chronic microvascular ischemic change.  No acute intracranial abnormality including infarction, hemorrhage, mass lesion, mass effect, midline shift or abnormal extra-axial fluid collection is identified.  Hematoma over the frontal bone is noted.  No fracture is seen.  IMPRESSION:  1.  Hematoma over the frontal bone without underlying fracture or for acute intracranial abnormality. 2.  Atrophy and chronic microvascular ischemic change.  CT CERVICAL SPINE  Findings: No fracture or subluxation of the cervical spine is identified.  Loss of disc space height and endplate spurring are most notable at C5-6 and C6-7.  Multilevel facet degenerative change is worse on the left.  Paraspinous soft tissue structures are unremarkable.  Apical scarring on the right is noted.  IMPRESSION: No acute finding.  Degenerative disc disease C5-6 and C6-7.   Original Report Authenticated By: Holley Dexter, M.D.    Date: 09/20/2012  Rate: 100  Rhythm: sinus tachycardia and premature ventricular contractions (PVC)  QRS Axis: normal  Intervals: normal  ST/T Wave abnormalities: normal  Conduction Disutrbances:right bundle branch block  Narrative Interpretation:   Old EKG Reviewed: none available   1. Fall at nursing home, initial encounter   2. Contusion of forehead, initial encounter   3. COPD (chronic obstructive pulmonary disease)   4. Hypoxia     MDM  79 yo male with fall,here has low grade fever and tachypnea.  Will get ct head, cspine, chest xray, labs.  Possible  pna?  Workup shows no pna, no head trauma.  Review of meds shows recent addition of augmentin and prednisone, so may be having COPD exacerbation.  O2 has been stable after addition of 2L.  Will d/c back to nursing home with albuterol mdi, 02 rx.  Also suggested patient have his toenails trimmed.  Olivia Mackie, MD 09/20/12 [phone]

## 2012-09-21 ENCOUNTER — Encounter (HOSPITAL_COMMUNITY): Payer: Self-pay | Admitting: Radiology

## 2012-09-21 ENCOUNTER — Inpatient Hospital Stay (HOSPITAL_COMMUNITY): Payer: Medicare Other

## 2012-09-21 LAB — URINE CULTURE: Culture: NO GROWTH

## 2012-09-21 LAB — CBC
HCT: 31.9 % — ABNORMAL LOW (ref 39.0–52.0)
MCHC: 34.5 g/dL (ref 30.0–36.0)
MCV: 89.9 fL (ref 78.0–100.0)
Platelets: 406 10*3/uL — ABNORMAL HIGH (ref 150–400)
RDW: 14 % (ref 11.5–15.5)
WBC: 18.4 10*3/uL — ABNORMAL HIGH (ref 4.0–10.5)

## 2012-09-21 LAB — COMPREHENSIVE METABOLIC PANEL
ALT: 16 U/L (ref 0–53)
Albumin: 2.2 g/dL — ABNORMAL LOW (ref 3.5–5.2)
Alkaline Phosphatase: 93 U/L (ref 39–117)
BUN: 13 mg/dL (ref 6–23)
Chloride: 97 mEq/L (ref 96–112)
Glucose, Bld: 183 mg/dL — ABNORMAL HIGH (ref 70–99)
Potassium: 3.9 mEq/L (ref 3.5–5.1)
Sodium: 136 mEq/L (ref 135–145)
Total Bilirubin: 0.3 mg/dL (ref 0.3–1.2)
Total Protein: 6.1 g/dL (ref 6.0–8.3)

## 2012-09-21 LAB — GLUCOSE, CAPILLARY: Glucose-Capillary: 149 mg/dL — ABNORMAL HIGH (ref 70–99)

## 2012-09-21 MED ORDER — IOHEXOL 300 MG/ML  SOLN
80.0000 mL | Freq: Once | INTRAMUSCULAR | Status: AC | PRN
  Administered 2012-09-21: 80 mL via INTRAVENOUS

## 2012-09-21 MED ORDER — PREDNISONE 50 MG PO TABS
50.0000 mg | ORAL_TABLET | Freq: Every day | ORAL | Status: DC
  Administered 2012-09-22: 50 mg via ORAL
  Filled 2012-09-21 (×3): qty 1

## 2012-09-21 NOTE — Care Management (Signed)
   CARE MANAGEMENT NOTE 09/21/2012  Patient:  DELTA, DESHMUKH   Account Number:  [id number]  Date Initiated:  09/21/2012  Documentation initiated by:  Mahdi Frye  Subjective/Objective Assessment:   Mr. Cowgill is a 79 year old male, admitted inpatient, chief c/o short of breathe, acute resp distress, dementia.     Action/Plan:   Discharge to  [facility]   Anticipated DC Date:     Anticipated DC Plan:    In-house referral  Clinical Social Worker         Choice offered to / List presented to:             Status of service:  In process, will continue to follow Medicare Important Message given?   (If response is "NO", the following Medicare IM given date fields will be blank) Date Medicare IM given:   Date Additional Medicare IM given:    Discharge Disposition:    Per UR Regulation:    If discussed at Long Length of Stay Meetings, dates discussed:    Comments:  09/21/2012 Karoline Caldwell, RN, BSN, BS CM Consult for COPD Gold protocol. This CM spoke with [employer] RN regarding receiving calls from patient's daughter questioning what happened with father. The contact person is on file is [place] with [employer]. Mr. Barstow is confused and not a good historian. Primary RN has concerns about safety for Mr. Fitzhenry. This CM will forward information to SW.    Karoline Caldwell, RN, BSN, [place]    [phone]

## 2012-09-21 NOTE — Progress Notes (Signed)
Consult received for COPD Gold Protocol.  Pt has dementia, thus COPD Gold Assessment cannot be completed.  Providence Crosby, LCSWA Clinical Social Work [phone]

## 2012-09-21 NOTE — Progress Notes (Signed)
[facility] PROGRESS NOTE  NOELL SHULAR MVH:[id number] DOB: 06/22/1930 DOA: 09/20/2012 PCP: Ron Parker, MD  Brief narrative: 79 year old male with past medical history of hypertension, dementia, COPD, diabetes who presented to [facility] ED from SNF with worsening shortness of breath with productive cough. Patient is unable to provide the details of his medical history due to dementia.  In ED, vital signs are stable with BP 135/94, O2 saturation 97% on room air and T 99.6 F. CXR revealed right upper lung lobe mass consistent with history of lung cancer. No preevios CXR available in EPIC for comparison. No previous records of history of lung cancer or treatments. CBC revealed WBC count of 14.5 and 18.8, hemoglobin of 11.7. BMP revealed potassium of 3.3 which normalized spontaneously to 3.9. CT head and cervical spine showed no acute intracranial findings.   Assessment and Plan:   Principal Problem:  *Acute respiratory distress  - not hypoxic, saturation is 94% on room air - likely due to HCAP or aspiration pneumonia/post obstructive penumonia related to lung mass/lung ca - continue  vanco and cefepime  - continue albuterol and Atrovent nebulizers as needed every 2 hours  - solumedrol 60 mg daily IV --> switch to  PO prednisone today 50 mg daily - oxygen support via nasal canula to keep O2 saturation above 90%  COPD (chronic obstructive pulmonary disease)  - COPD order set in place  - management as above with steroids (PO), nebulizer treatments PRN  and antibiotics  HCAP (healthcare-associated pneumonia)  - pneumonia order set in place  - follow up blood and resp culture results, legionella and strep pneumonia - all are pending at this time - continue vanco and cefepime  Active Problems:  Anemia  - stable hemoglobin at 11.7 --> 11.0 - no indications for transfusion  Hypokalemia  - resolved to 3.9 without supplementation  Leukocytosis  - likely due to steroids and pneumonia  - continue  to monitor; WBC count trending down Dementia  - continue aricept and namenda  Lung mass  - right upper lung lobe  - will obtain CT chest for further evaluation and CT guided biopsy of the mass for diagnostic evaluation DM (diabetes mellitus)  - continue metformin 500 mg daily  HTN (hypertension)  - continue Hctz - BP 126/55   Code Status: full code Family Communication: no family at the bedside Disposition Plan: to SNF when stable  Manson Passey, MD  [facility] Pager [phone]  If 7PM-7AM, please contact night-coverage [email or web address] Password [email or web address] 09/21/2012, 5:22 AM   LOS: 1 day   Consultants:  None   Procedures:  None   Antibiotics:  Vanco 09/20/2012 -->  Cefepime 09/20/2012 -->  HPI/Subjective: No overnight events.  Objective: Filed Vitals:   09/20/12 1706 09/20/12 1846 09/20/12 2044 09/21/12 0446  BP: 117/69 159/76 137/59 126/55  Pulse: 101 100 98 84  Temp:  98 F (36.7 C) 99 F (37.2 C) 98 F (36.7 C)  TempSrc:   Oral Oral  Resp: 26 20 18 16   Height:  5\' 9"  (1.753 m)    Weight:  73.9 kg (162 lb 14.7 oz)    SpO2: 93% 94% 93% 94%    Intake/Output Summary (Last 24 hours) at 09/21/12 0522 Last data filed at 09/21/12 0200  Gross per 24 hour  Intake      0 ml  Output    500 ml  Net   -500 ml    Exam:   General:  Pt is alert, confused, not in  acute distress  Cardiovascular: Regular rate and rhythm, S1/S2 appreciated  Respiratory: rhonchi in right upper lung  Abdomen: Soft, non tender, non distended, bowel sounds present, no guarding  Extremities: No edema, pulses DP and PT palpable bilaterally  Neuro: Grossly nonfocal  Data Reviewed: Basic Metabolic Panel:  Recent Labs Lab 09/20/12 0410 09/20/12 1510 09/20/12 2010 09/21/12 0352  NA 136 137 138 136  K 3.3* 3.9 3.6 3.9  CL 95* 95* 96 97  CO2 30 31 33* 32  GLUCOSE 188* 207* 158* 183*  BUN 18 18 15 13   CREATININE 0.82 0.83 0.72 0.59  CALCIUM 8.8 8.8 8.9 8.9  MG  --   --  1.8  --   PHOS   --   --  2.9  --    Liver Function Tests:  Recent Labs Lab 09/20/12 1510 09/20/12 2010 09/21/12 0352  AST 19 18 21   ALT 19 18 16   ALKPHOS 91 92 93  BILITOT 0.5 0.3 0.3  PROT 6.5 6.6 6.1  ALBUMIN 2.4* 2.4* 2.2*   No results found for this basename: LIPASE, AMYLASE,  in the last 168 hours No results found for this basename: AMMONIA,  in the last 168 hours CBC:  Recent Labs Lab 09/20/12 0410 09/20/12 1510 09/20/12 2010 09/21/12 0352  WBC 14.5* 18.8* 20.9* 18.4*  NEUTROABS 12.2* 15.8* 17.3*  --   HGB 11.7* 11.4* 11.5* 11.0*  HCT 33.7* 33.2* 33.7* 31.9*  MCV 89.2 89.5 90.1 89.9  PLT 410* 453* 433* 406*   Cardiac Enzymes: No results found for this basename: CKTOTAL, CKMB, CKMBINDEX, TROPONINI,  in the last 168 hours BNP: No components found with this basename: POCBNP,  CBG: No results found for this basename: GLUCAP,  in the last 168 hours  Recent Results (from the past 240 hour(s))  MRSA PCR SCREENING     Status: None   Collection Time    09/20/12 11:02 PM      Result Value Range Status   MRSA by PCR NEGATIVE  NEGATIVE Final   Comment:            The GeneXpert MRSA Assay (FDA     approved for NASAL specimens     only), is one component of a     comprehensive MRSA colonization     surveillance program. It is not     intended to diagnose MRSA     infection nor to guide or     monitor treatment for     MRSA infections.     Studies: Dg Chest 2 View  09/20/2012   *RADIOLOGY REPORT*  Clinical Data: Fall, weakness, unable to walk well, the kidney, cough, history lung cancer, hypertension, COPD  CHEST - 2 VIEW  Comparison: Exam at 1603 hours compared to earlier study of 0425 hours  Findings: Mild volume loss in right hemithorax again noted. Enlargement of cardiac silhouette with tortuous aorta. Pulmonary vascular congestion. Right perihilar opacity compatible with tumor and post-treatment changes. Bibasilar atelectasis versus infiltrates with small right pleural  effusion. No pneumothorax. Bones demineralized.  IMPRESSION: Right perihilar opacity consistent with lung cancer and post- therapy changes. COPD changes with bibasilar atelectasis versus infiltrates and small right pleural effusion.   Original Report Authenticated By: Ulyses Southward, M.D.   Dg Chest 2 View  09/20/2012   *RADIOLOGY REPORT*  Clinical Data: Status post fall.  Chest pain.  History of lung cancer.  CHEST - 2 VIEW  Comparison: None.  Findings: There is volume loss in the  right chest with a mass-like opacity in the right upper lobe.  Blunting of the right costophrenic angle could be due to a small effusion or scar. Surgical clips right hilum noted.  The left lung appears clear. Heart size is normal.  IMPRESSION: Post-treatment change right chest with a mass-like opacity right upper lobe compatible with history of lung cancer.  Small right pleural effusion.   Original Report Authenticated By: Holley Dexter, M.D.   Ct Head Wo Contrast  09/20/2012   *RADIOLOGY REPORT*  Clinical Data:  Status post fall with a blow to the head.  CT HEAD WITHOUT CONTRAST CT CERVICAL SPINE WITHOUT CONTRAST  Technique:  Multidetector CT imaging of the head and cervical spine was performed following the standard protocol without intravenous contrast.  Multiplanar CT image reconstructions of the cervical spine were also generated.  Comparison:  None.  CT HEAD  Findings: The brain is atrophic with chronic microvascular ischemic change.  No acute intracranial abnormality including infarction, hemorrhage, mass lesion, mass effect, midline shift or abnormal extra-axial fluid collection is identified.  Hematoma over the frontal bone is noted.  No fracture is seen.  IMPRESSION:  1.  Hematoma over the frontal bone without underlying fracture or for acute intracranial abnormality. 2.  Atrophy and chronic microvascular ischemic change.  CT CERVICAL SPINE  Findings: No fracture or subluxation of the cervical spine is identified. Loss of  disc space height and endplate spurring are most notable at C5-6 and C6-7.  Multilevel facet degenerative change is worse on the left.  Paraspinous soft tissue structures are unremarkable.  Apical scarring on the right is noted.  IMPRESSION: No acute finding.  Degenerative disc disease C5-6 and C6-7.   Original Report Authenticated By: Holley Dexter, M.D.   Ct Cervical Spine Wo Contrast  09/20/2012   *RADIOLOGY REPORT*  Clinical Data:  Status post fall with a blow to the head.  CT HEAD WITHOUT CONTRAST CT CERVICAL SPINE WITHOUT CONTRAST  Technique:  Multidetector CT imaging of the head and cervical spine was performed following the standard protocol without intravenous contrast.  Multiplanar CT image reconstructions of the cervical spine were also generated.  Comparison:  None.  CT HEAD  Findings: The brain is atrophic with chronic microvascular ischemic change.  No acute intracranial abnormality including infarction, hemorrhage, mass lesion, mass effect, midline shift or abnormal extra-axial fluid collection is identified.  Hematoma over the frontal bone is noted.  No fracture is seen.  IMPRESSION:  1.  Hematoma over the frontal bone without underlying fracture or for acute intracranial abnormality. 2.  Atrophy and chronic microvascular ischemic change.  CT CERVICAL SPINE  Findings: No fracture or subluxation of the cervical spine is identified. Loss of disc space height and endplate spurring are most notable at C5-6 and C6-7.  Multilevel facet degenerative change is worse on the left.  Paraspinous soft tissue structures are unremarkable.  Apical scarring on the right is noted.  IMPRESSION: No acute finding.  Degenerative disc disease C5-6 and C6-7.   Original Report Authenticated By: Holley Dexter, M.D.    Scheduled Meds: . acidophilus  1 capsule Oral Daily  . aspirin  81 mg Oral Daily  . ceFEPime (MAXIPIME) IV  1 g Intravenous Q8H  . citalopram  20 mg Oral Daily  . divalproex  500 mg Oral BID  .  donepezil  10 mg Oral QHS  . felodipine  5 mg Oral Daily  . gabapentin  100 mg Oral TID  . haloperidol  0.5 mg  Oral BID  . hydrochlorothiazide  25 mg Oral Daily  . memantine  5 mg Oral BID  . metFORMIN  500 mg Oral Q breakfast  . methylPREDNISolone (SOLU-MEDROL) injection  60 mg Intravenous Daily  . vancomycin  1,000 mg Intravenous Q12H   Continuous Infusions: . sodium chloride 75 mL/hr (09/21/12 0456)

## 2012-09-21 NOTE — Progress Notes (Signed)
PT note:  Went to see pt for PT eval and note that he was very agitated and yelling at nurse when PT approached.  Will hold PT eval until tomorrow am.  Thanks, Harriet Butte, PT

## 2012-09-21 NOTE — Progress Notes (Addendum)
Contacted [facility] and discussed the situation, re: Pt's daughter asking for information, with [facility].  Informed by [facility] that Pt's daughter has visited with Pt at that facility several times and that she has made decisions for Pt, by hx.  Notified RN.  Providence Crosby, LCSWA Clinical Social Work [phone]

## 2012-09-22 DIAGNOSIS — J690 Pneumonitis due to inhalation of food and vomit: Principal | ICD-10-CM

## 2012-09-22 DIAGNOSIS — J449 Chronic obstructive pulmonary disease, unspecified: Secondary | ICD-10-CM

## 2012-09-22 DIAGNOSIS — F0390 Unspecified dementia without behavioral disturbance: Secondary | ICD-10-CM

## 2012-09-22 DIAGNOSIS — C3490 Malignant neoplasm of unspecified part of unspecified bronchus or lung: Secondary | ICD-10-CM

## 2012-09-22 DIAGNOSIS — E119 Type 2 diabetes mellitus without complications: Secondary | ICD-10-CM

## 2012-09-22 LAB — GLUCOSE, CAPILLARY: Glucose-Capillary: 209 mg/dL — ABNORMAL HIGH (ref 70–99)

## 2012-09-22 MED ORDER — DEXTROSE-NACL 5-0.9 % IV SOLN
INTRAVENOUS | Status: DC
  Administered 2012-09-22 – 2012-09-23 (×2): via INTRAVENOUS

## 2012-09-22 NOTE — Consult Note (Signed)
PULMONARY  / CRITICAL CARE MEDICINE  Name: Joel Summers MRN: [id number] DOB: January 27, 1931    ADMISSION DATE:  09/20/2012 CONSULTATION DATE:  09/22/12  REFERRING MD :  Elisabeth Pigeon PRIMARY SERVICE:  [facility]  CHIEF COMPLAINT:  Eval abn CXR  BRIEF PATIENT DESCRIPTION:  78 y/o WM from nursing home, adm for SOB, cough, and abn CXR w/ extensive right sided changes from hx lung cancer & treatment (no details avail in Epic).  ?Aspiration raised w/ debris seen in right airways on CT Chest...  SIGNIFICANT EVENTS / STUDIES:    LINES / TUBES:   CULTURES: 8/8 Blood x2>> 8/8 Urine>> Sput- could not collect  ANTIBIOTICS: Cefepime 8/8>>  Vancomycin 8/8>>  HISTORY OF PRESENT ILLNESS:   79 y/o WM resides in SNF w/ reported hx COPD, Lung cancer, and Dementia (unable to provide any meaningful hx & no family avail to provide hx)-- presented to ER 8/8 after fall in the NH, no apparent injury, but found to be SOB, cough, and abn CXR on initial assessment in ER...  Pt appears in no distress- RR20, O2sat=97% on 4L in ER and 90-1% on RA now  Exam w/ decr BS on right, no resp distress  CXR 8/8 w/ COPD, cardiomeg & tortAo, vol loss on right w/ surg clips R hilum, retraction, RUL opac, blunt angle, etc...  CTChest 8/8 w/ debris in right mainstem & RLL bronchus, R>L patchy bilat airsp dis, right suprahilar opac & bronchiectasis, adenopathy, sm effusions, etc...   PAST MEDICAL HISTORY :  Past Medical History  Diagnosis Date  . Altered mental status   . COPD (chronic obstructive pulmonary disease)   . Diabetes mellitus without complication   . Hypertension   . Cancer   . Lung cancer    History reviewed. No pertinent past surgical history. He has obviously had surg for lung cancer but no records avail at this time...  Prior to Admission medications   Medication Sig Start Date End Date Taking? Authorizing Provider  acetaminophen (TYLENOL) 500 MG tablet Take 500 mg by mouth every 6 (six) hours as needed  for pain.   Yes Historical Provider, MD  acidophilus (RISAQUAD) CAPS capsule Take 1 capsule by mouth daily. For 20 days   Yes Historical Provider, MD  albuterol (PROVENTIL HFA;VENTOLIN HFA) 108 (90 BASE) MCG/ACT inhaler Inhale 1-2 puffs into the lungs every 4 (four) hours as needed for wheezing or shortness of breath. 09/20/12  Yes Olivia Mackie, MD  albuterol (PROVENTIL) 2 MG tablet Take 2 mg by mouth 3 (three) times daily. For 5 days   Yes Historical Provider, MD  amoxicillin-clavulanate (AUGMENTIN) 500-125 MG per tablet Take 1 tablet by mouth 3 (three) times daily. For 10 days only   Yes Historical Provider, MD  aspirin 81 MG chewable tablet Chew 81 mg by mouth daily.   Yes Historical Provider, MD  citalopram (CELEXA) 20 MG tablet Take 20 mg by mouth daily.   Yes Historical Provider, MD  divalproex (DEPAKOTE ER) 500 MG 24 hr tablet Take 500 mg by mouth 2 (two) times daily.   Yes Historical Provider, MD  donepezil (ARICEPT) 10 MG tablet Take 10 mg by mouth at bedtime.   Yes Historical Provider, MD  felodipine (PLENDIL) 5 MG 24 hr tablet Take 5 mg by mouth daily.   Yes Historical Provider, MD  gabapentin (NEURONTIN) 100 MG capsule Take 100 mg by mouth 3 (three) times daily.   Yes Historical Provider, MD  guaiFENesin (ROBITUSSIN) 100 MG/5ML liquid Take 200  mg by mouth 3 (three) times daily as needed for cough.   Yes Historical Provider, MD  haloperidol (HALDOL) 0.5 MG tablet Take 0.5 mg by mouth 2 (two) times daily.   Yes Historical Provider, MD  hydrochlorothiazide (HYDRODIURIL) 25 MG tablet Take 25 mg by mouth daily.   Yes Historical Provider, MD  levofloxacin (LEVAQUIN) 500 MG tablet Take 500 mg by mouth daily. For 10 days only   Yes Historical Provider, MD  loperamide (IMODIUM A-D) 2 MG tablet Take 2 mg by mouth 4 (four) times daily as needed for diarrhea or loose stools.   Yes Historical Provider, MD  magnesium hydroxide (MILK OF MAGNESIA) 400 MG/5ML suspension Take 30 mLs by mouth daily as needed  for constipation.   Yes Historical Provider, MD  Memantine HCl ER (NAMENDA XR) 14 MG CP24 Take by mouth.   Yes Historical Provider, MD  metFORMIN (GLUCOPHAGE) 500 MG tablet Take 500 mg by mouth daily with breakfast.   Yes Historical Provider, MD  predniSONE (DELTASONE) 20 MG tablet Take 20 mg by mouth daily. For 5 days   Yes Historical Provider, MD   Allergies  Allergen Reactions  . Ativan (Lorazepam)    FAMILY HISTORY:  No family history on file.  SOCIAL HISTORY:  reports that he quit smoking about 34 years ago. He has never used smokeless tobacco. He reports that he does not drink alcohol or use illicit drugs.  REVIEW OF SYSTEMS:   Unobtainable due to dementia- no family or records avail to help...   SUBJECTIVE:   VITAL SIGNS: Temp:  [97.6 F (36.4 C)-97.9 F (36.6 C)] 97.6 F (36.4 C) (08/10 0609) Pulse Rate:  [86-110] 86 (08/10 0609) Resp:  [17-18] 17 (08/10 0609) BP: (155-156)/(72-76) 155/72 mmHg (08/10 0609) SpO2:  [91 %-92 %] 91 % (08/10 0609) Weight:  [77.52 kg (170 lb 14.4 oz)] 77.52 kg (170 lb 14.4 oz) (08/10 0609)  PHYSICAL EXAMINATION: Constitutional: Elderly, appears chr ill. No distress.  HENT: Normocephalic. Dry mucus membranes. No lesions seen Eyes: Conjunctivae and EOM are normal. PERRLA, no scleral icterus.  Neck: Decr ROM. No JVD. No tracheal deviation. No thyromegaly.  CVS: RRR, Gr1/6 SEM at lsb w/o rubs or gallops detected Lungs: decr BS on right, few scat rhonchi Abdominal: Soft & nontender w/o guarding or rebound Musculoskeletal: DJD- sl decr ROM. No edema and no tenderness.  Lymphadenopathy: No lymphadenopathy noted, cervical, inguinal. Neuro: Alert. Not oriented, no focal neurologic deficits  Skin: Skin is warm and dry.    Recent Labs Lab 09/20/12 1510 09/20/12 2010 09/21/12 0352  NA 137 138 136  K 3.9 3.6 3.9  CL 95* 96 97  CO2 31 33* 32  BUN 18 15 13   CREATININE 0.83 0.72 0.59  GLUCOSE 207* 158* 183*    Recent Labs Lab  09/20/12 1510 09/20/12 2010 09/21/12 0352  HGB 11.4* 11.5* 11.0*  HCT 33.2* 33.7* 31.9*  WBC 18.8* 20.9* 18.4*  PLT 453* 433* 406*   Dg Chest 2 View  09/20/2012   *RADIOLOGY REPORT*  Clinical Data: Fall, weakness, unable to walk well, the kidney, cough, history lung cancer, hypertension, COPD  CHEST - 2 VIEW  Comparison: Exam at 1603 hours compared to earlier study of 0425 hours  Findings: Mild volume loss in right hemithorax again noted. Enlargement of cardiac silhouette with tortuous aorta. Pulmonary vascular congestion. Right perihilar opacity compatible with tumor and post-treatment changes. Bibasilar atelectasis versus infiltrates with small right pleural effusion. No pneumothorax. Bones demineralized.  IMPRESSION: Right perihilar  opacity consistent with lung cancer and post- therapy changes. COPD changes with bibasilar atelectasis versus infiltrates and small right pleural effusion.   Original Report Authenticated By: Ulyses Southward, M.D.   Ct Chest W Contrast  09/21/2012   *RADIOLOGY REPORT*  Clinical Data: Evaluate right upper lobe lung mass. History lung cancer.  Elevated white blood cells.  Acute respiratory distress. History of dementia.  CT CHEST WITH CONTRAST  Technique:  Multidetector CT imaging of the chest was performed following the standard protocol during bolus administration of intravenous contrast.  Contrast: 80mL OMNIPAQUE IOHEXOL 300 MG/ML  SOLN  Comparison: Plain film of 09/20/2012.  No prior CT.  Findings: Lungs/pleura: Mild motion degradation.  Material in the right mainstem bronchus on image 38/series 6 and lower lobe bronchi on image 43/series 6.  Right greater than left patchy bibasilar airspace disease.  Right suprahilar confluent pulmonary opacity with bronchiectasis.  This is identified lateral and anterior to surgical sutures, including on image 39/series 6.  Trace left pleural fluid.  Small right-sided pleural effusion with minimal wall thickening or loculation anteriorly.   Surgical sutures about right lower lobe bronchi.  Heart/Mediastinum: Mild cardiomegaly.  Coronary artery atherosclerosis.  Main pulmonary artery enlargement. No central pulmonary embolism, on this non-dedicated study.  Mediastinal deviation to the right with volume loss in the right hemithorax.  Right paratracheal node 1.2 cm on image 28. Subcarinal node 1.7 cm on image 41.  Upper abdomen: Fatty atrophy of the pancreas.  Normal imaged adrenal glands.  Bones/Musculoskeletal:  Postsurgical or post-traumatic defects of right-sided ribs.  IMPRESSION:  1.  Findings which are highly suspicious for aspiration.  Debris within the right-sided endobronchial tree with bibasilar, right greater than left patchy airspace opacities. 2.  Right-sided pulmonary process which is primarily felt to be treatment/radiation related.  No convincing evidence of dominant residual or recurrent primary tumor.  CT follow-up and consideration of eventual PET may be informative.  In addition, comparison with prior exams would be useful. 3.  Thoracic adenopathy, for which which metastatic disease is suspected.  Alternatively, these nodes could be reactive. 4.  Right greater than left bilateral pleural effusions. 5. Pulmonary artery enlargement suggests pulmonary arterial hypertension.   Original Report Authenticated By: Jeronimo Greaves, M.D.         ASSESSMENT / PLAN:  1)  COPD- records indicate he was on Proventil 2mg Tid, ProventilHFA prn, Robitussin, Pred20mg  & Augmentin/ Levaquin recently in the NH.Marland KitchenMarland Kitchen 2)  Hx Lung Cancer w/ surg & ?XRT- we do not have any records or details & this would obviously be of signif value in his current assessment... 3)  Poss aspiration w/ debris in right sided airways- he is not in distress & not coughing at present; will consider bronchoscopy w/ lavage...  REC>> - agree w/ keeping him NPO for now, we will consider bronch w/ lavage, needs swallowing eval as you are planning... - agree w/ NEBS=> try to get sput  for cult - agree w/ empiric Maxipime, Vanco coverage for now - wean Pred rapidly (looks like it was started in the NH) - careful w/ diuretic> watch renal & K - background medical data, details of his prev lung cancer therapy, etc are very important 7 every effort needs to be made to obtain this info from family, NH, prev hosp etc...   Michele Mcalpine, MD Pulmonary Medicine [facility] Pager: [phone] 09/22/2012, 3:43 PM

## 2012-09-22 NOTE — Evaluation (Signed)
Physical Therapy Evaluation Patient Details Name: Joel Summers MRN: [id number] DOB: 07/31/1930 Today's Date: 09/22/2012 Time: 1212-1227 PT Time Calculation (min): 15 min  PT Assessment / Plan / Recommendation History of Present Illness  Patient admitted from SNF with pneumonia and recent fall.  Clinical Impression  Pt is currently min/guard for ambulation with RW at this time.  Feel he will benefit from skilled PT in acute venue to address mobility deficits and maintained mobility, as pts SaO2 was 90% during ambulation.  Respiratory therapist aware and checked pts lungs at time of eval.  Pt to return to SNF at d/c.     PT Assessment  Patient needs continued PT services    Follow Up Recommendations  SNF;Supervision/Assistance - 24 hour    Does the patient have the potential to tolerate intense rehabilitation      Barriers to Discharge        Equipment Recommendations  Rolling walker with 5" wheels    Recommendations for Other Services     Frequency Min 3X/week    Precautions / Restrictions Precautions Precautions: Fall Restrictions Weight Bearing Restrictions: No   Pertinent Vitals/Pain No pain      Mobility  Bed Mobility Bed Mobility: Supine to Sit Supine to Sit: 5: Supervision Details for Bed Mobility Assistance: Supervision for safety.  Transfers Transfers: Sit to Stand;Stand to Sit Sit to Stand: 5: Supervision;From bed Stand to Sit: 5: Supervision;To chair/3-in-1 Details for Transfer Assistance: Supervision for safety with min cues for technique.  Ambulation/Gait Ambulation/Gait Assistance: 4: Min guard;5: Supervision Ambulation Distance (Feet): 165 Feet Assistive device: Rolling walker Ambulation/Gait Assistance Details: Pt demonstrates short shuffled steps during ambulation, however is overall steady with use of RW.  States that he did not use one before, however unsure due to cognitive deficits.   Gait Pattern: Step-through pattern;Decreased stride  length;Shuffle;Trunk flexed Gait velocity: decreased    Exercises     PT Diagnosis: Difficulty walking;Generalized weakness  PT Problem List: Decreased strength;Decreased activity tolerance;Decreased balance;Decreased mobility;Decreased coordination;Decreased cognition;Decreased knowledge of use of DME;Decreased safety awareness;Decreased knowledge of precautions PT Treatment Interventions: DME instruction;Gait training;Functional mobility training;Therapeutic activities;Therapeutic exercise;Balance training;Cognitive remediation     PT Goals(Current goals can be found in the care plan section) Acute Rehab PT Goals Patient Stated Goal: n/a PT Goal Formulation: Patient unable to participate in goal setting Time For Goal Achievement: 09/29/12 Potential to Achieve Goals: Good  Visit Information  Last PT Received On: 09/22/12 Assistance Needed: +1 History of Present Illness: Patient admitted from SNF with pneumonia and recent fall.       Prior Functioning  Home Living Family/patient expects to be discharged to:: Skilled nursing facility Prior Function Level of Independence: Needs assistance Comments: unknown and patient unable to state Communication Communication: No difficulties Dominant Hand: Right    Cognition  Cognition Arousal/Alertness: Awake/alert Behavior During Therapy: WFL for tasks assessed/performed Overall Cognitive Status: History of cognitive impairments - at baseline Memory: Decreased short-term memory    Extremity/Trunk Assessment Upper Extremity Assessment Upper Extremity Assessment: Overall WFL for tasks assessed Lower Extremity Assessment Lower Extremity Assessment: Generalized weakness Cervical / Trunk Assessment Cervical / Trunk Assessment: Kyphotic   Balance    End of Session PT - End of Session Activity Tolerance: Patient tolerated treatment well Patient left: in chair;with call bell/phone within reach;with nursing/sitter in room Nurse  Communication: Mobility status  GP     Vista Deck 09/22/2012, 12:52 PM

## 2012-09-22 NOTE — Progress Notes (Signed)
INITIAL NUTRITION ASSESSMENT  DOCUMENTATION CODES Per approved criteria  -Not Applicable   INTERVENTION: Patient for SLP swallow eval.  Currently NPO.  If unable to advance diet and desire for enteral nutrition, recommend Jeviity 1.2  "goal" of 65 ml/hr to meet estimated needs and provide 1872 kcal, 87 gm protein, 1260 ml free water.  NUTRITION DIAGNOSIS: Inadequate oral intake related to inability to eat as evidenced by npo status.   Goal: Patient to meet >90% estimated needs with oral vs enteral nutrition.  Monitor: diet advancement, labs, weight, intake, plan of care.  Reason for Assessment: Assessment of nutritional status, poor po intake.  79 y.o. male  Admitting Dx: Respiratory distress, copd, pna, anemia  ASSESSMENT: 79 year old male with past medical history of hypertension, dementia, COPD, diabetes who presented to [facility] ED from SNF with worsening shortness of breath with productive cough. Patient is unable to provide the details of his medical history due to dementia.   Patient sleeping in chair.  NPO.  UBW and usual intake are unknown.     Height: Ht Readings from Last 1 Encounters:  09/20/12 5\' 9"  (1.753 m)    Weight: Wt Readings from Last 1 Encounters:  09/22/12 170 lb 14.4 oz (77.52 kg)    Ideal Body Weight: 160 lbs  % Ideal Body Weight: 107  Wt Readings from Last 10 Encounters:  09/22/12 170 lb 14.4 oz (77.52 kg)    Usual Body Weight: unknown  % Usual Body Weight: unknown  BMI:  Body mass index is 25.23 kg/(m^2).  Estimated Nutritional Needs: Kcal: 1900-2000 Protein: 80-90 gm Fluid: >2L daily  Skin: wnl  Diet Order: NPO  EDUCATION NEEDS: -No education needs identified at this time   Intake/Output Summary (Last 24 hours) at 09/22/12 0930 Last data filed at 09/22/12 0610  Gross per 24 hour  Intake    100 ml  Output    550 ml  Net   -450 ml    Last BM:  unknown  Labs:   Recent Labs Lab 09/20/12 1510 09/20/12 2010  09/21/12 0352  NA 137 138 136  K 3.9 3.6 3.9  CL 95* 96 97  CO2 31 33* 32  BUN 18 15 13   CREATININE 0.83 0.72 0.59  CALCIUM 8.8 8.9 8.9  MG  --  1.8  --   PHOS  --  2.9  --   GLUCOSE 207* 158* 183*    CBG (last 3)   Recent Labs  09/21/12 0739 09/22/12 0737  GLUCAP 149* 144*    Scheduled Meds: . acidophilus  1 capsule Oral Daily  . aspirin  81 mg Oral Daily  . ceFEPime (MAXIPIME) IV  1 g Intravenous Q8H  . citalopram  20 mg Oral Daily  . divalproex  500 mg Oral BID  . donepezil  10 mg Oral QHS  . felodipine  5 mg Oral Daily  . gabapentin  100 mg Oral TID  . haloperidol  0.5 mg Oral BID  . hydrochlorothiazide  25 mg Oral Daily  . memantine  5 mg Oral BID  . metFORMIN  500 mg Oral Q breakfast  . predniSONE  50 mg Oral Q breakfast  . vancomycin  1,000 mg Intravenous Q12H    Continuous Infusions: . sodium chloride 75 mL/hr at 09/21/12 1408    Past Medical History  Diagnosis Date  . Altered mental status   . COPD (chronic obstructive pulmonary disease)   . Diabetes mellitus without complication   . Hypertension   .  Cancer   . Lung cancer     History reviewed. No pertinent past surgical history.  Oran Rein, RD, LDN Clinical Inpatient Dietitian Pager:  [phone] Weekend and after hours pager:  [phone]

## 2012-09-22 NOTE — Progress Notes (Signed)
[facility] PROGRESS NOTE  Joel Summers ZOX:[id number] DOB: 11-Jul-1930 DOA: 09/20/2012 PCP: Ron Parker, MD  Brief narrative: 79 year old male with past medical history of hypertension, dementia, COPD, diabetes who presented to [facility] ED from SNF with worsening shortness of breath with productive cough. Patient is unable to provide the details of his medical history due to dementia.  In ED, vital signs are stable with BP 135/94, O2 saturation 97% on room air and T 99.6 F. CXR revealed right upper lung lobe mass consistent with history of lung cancer. No preevios CXR available in EPIC for comparison. No previous records of history of lung cancer or treatments. CBC revealed WBC count of 14.5 and 18.8, hemoglobin of 11.7. BMP revealed potassium of 3.3 which normalized spontaneously to 3.9. CT head and cervical spine showed no acute intracranial findings. We further obtained CT chest for evaluation of right upper lung mass which was significant for possible aspiration in addition to debris within the right-sided endobronchial tree with bibasilar opacities, right-sided pulmonary process which is primarily felt to be treatment/radiation related.  No convincing evidence of dominant residual or recurrent primary tumor.   Assessment and Plan:   Principal Problem:  *Acute respiratory distress  - not hypoxic, saturation is 94% on room air  - likely due to HCAP or aspiration pneumonia/post obstructive penumonia related to lung mass/lung ca  - continue vanco and cefepime  - continue albuterol and Atrovent nebulizers as needed every 2 hours  - continue prednisone 50 mg daily  - oxygen support via nasal canula to keep O2 saturation above 90%  COPD (chronic obstructive pulmonary disease)  - COPD order set in place  - management as above with steroids (PO), nebulizer treatments PRN and antibiotics  HCAP (healthcare-associated pneumonia)  - pneumonia order set in place  - follow up blood and resp culture  results - pending - legionella and strep pneumonia - negative - continue vanco and cefepime  Active Problems:  Anemia  - stable hemoglobin at 11.7 --> 11.0  - no indications for transfusion  Hypokalemia  - resolved to 3.9 without supplementation  Leukocytosis  - likely due to steroids and pneumonia  - continue to monitor; WBC count trending down  Dementia  - continue aricept and namenda  Lung mass  - right upper lung lobe  - CT finding as above significant for possible aspiration in addition to debris within the right-sided endobronchial tree with bibasilar opacities, right-sided pulmonary process which is primarily felt to be treatment/radiation related.  No convincing evidence of dominant residual or recurrent primary tumor.  - will consult oncology for an input DM (diabetes mellitus)  - continue metformin 500 mg daily  HTN (hypertension)  - continue Hctz  - BP 152/72  Code Status: full code  Family Communication: no family at the bedside  Disposition Plan: to SNF when stable    Manson Passey, MD  [facility]  Pager [phone]    Consultants:  None  Procedures:  None  Antibiotics:  Vanco 09/20/2012 -->  Cefepime 09/20/2012 -->  If 7PM-7AM, please contact night-coverage [email or web address] Password [email or web address] 09/22/2012, 7:00 AM   LOS: 2 days    HPI/Subjective: No acute overnight events.  Objective: Filed Vitals:   09/21/12 0446 09/21/12 1410 09/21/12 2023 09/22/12 0609  BP: 126/55 144/69 156/76 155/72  Pulse: 84 90 110 86  Temp: 98 F (36.7 C) 97.8 F (36.6 C) 97.9 F (36.6 C) 97.6 F (36.4 C)  TempSrc: Oral Oral Oral Oral  Resp: 16 18  18 17  Height:      Weight:    77.52 kg (170 lb 14.4 oz)  SpO2: 94% 93% 92% 91%    Intake/Output Summary (Last 24 hours) at 09/22/12 0700 Last data filed at 09/22/12 0610  Gross per 24 hour  Intake    340 ml  Output    750 ml  Net   -410 ml    Exam:   General:  Pt is alert, not in acute distress  Cardiovascular: irregular  rhythm, rate controlled, S1/S2  Respiratory: Clear to auscultation bilaterally, no wheezing, no crackles, no rhonchi  Abdomen: Soft, non tender, non distended, bowel sounds present, no guarding  Extremities: No edema, pulses DP and PT palpable bilaterally  Neuro: Grossly nonfocal  Data Reviewed: Basic Metabolic Panel:  Recent Labs Lab 09/20/12 0410 09/20/12 1510 09/20/12 2010 09/21/12 0352  NA 136 137 138 136  K 3.3* 3.9 3.6 3.9  CL 95* 95* 96 97  CO2 30 31 33* 32  GLUCOSE 188* 207* 158* 183*  BUN 18 18 15 13   CREATININE 0.82 0.83 0.72 0.59  CALCIUM 8.8 8.8 8.9 8.9  MG  --   --  1.8  --   PHOS  --   --  2.9  --    Liver Function Tests:  Recent Labs Lab 09/20/12 1510 09/20/12 2010 09/21/12 0352  AST 19 18 21   ALT 19 18 16   ALKPHOS 91 92 93  BILITOT 0.5 0.3 0.3  PROT 6.5 6.6 6.1  ALBUMIN 2.4* 2.4* 2.2*   No results found for this basename: LIPASE, AMYLASE,  in the last 168 hours No results found for this basename: AMMONIA,  in the last 168 hours CBC:  Recent Labs Lab 09/20/12 0410 09/20/12 1510 09/20/12 2010 09/21/12 0352  WBC 14.5* 18.8* 20.9* 18.4*  NEUTROABS 12.2* 15.8* 17.3*  --   HGB 11.7* 11.4* 11.5* 11.0*  HCT 33.7* 33.2* 33.7* 31.9*  MCV 89.2 89.5 90.1 89.9  PLT 410* 453* 433* 406*   Cardiac Enzymes: No results found for this basename: CKTOTAL, CKMB, CKMBINDEX, TROPONINI,  in the last 168 hours BNP: No components found with this basename: POCBNP,  CBG:  Recent Labs Lab 09/21/12 0739  GLUCAP 149*    URINE CULTURE     Status: None   Collection Time    09/20/12  4:52 PM      Result Value Range Status   Specimen Description URINE, RANDOM   Final   Special Requests NONE   Final   Culture  Setup Time     Final   Value: 09/20/2012 22:14     Performed at [facility] Count     Final   Value: NO GROWTH     Performed at [facility]   Culture     Final   Value: NO GROWTH     Performed at [facility]    Report Status 09/21/2012 FINAL   Final  MRSA PCR SCREENING     Status: None   Collection Time    09/20/12 11:02 PM      Result Value Range Status   MRSA by PCR NEGATIVE  NEGATIVE Final     Studies: Dg Chest 2 View 09/20/2012   * IMPRESSION: Right perihilar opacity consistent with lung cancer and post- therapy changes. COPD changes with bibasilar atelectasis versus infiltrates and small right pleural effusion.   Original Report Authenticated By: Ulyses Southward, M.D.   Ct Chest W Contrast  09/21/2012     IMPRESSION:  1.  Findings which are highly suspicious for aspiration.  Debris within the right-sided endobronchial tree with bibasilar, right greater than left patchy airspace opacities. 2.  Right-sided pulmonary process which is primarily felt to be treatment/radiation related.  No convincing evidence of dominant residual or recurrent primary tumor.  CT follow-up and consideration of eventual PET may be informative.  In addition, comparison with prior exams would be useful. 3.  Thoracic adenopathy, for which which metastatic disease is suspected.  Alternatively, these nodes could be reactive. 4.  Right greater than left bilateral pleural effusions. 5. Pulmonary artery enlargement suggests pulmonary arterial hypertension.   Original Report Authenticated By: Jeronimo Greaves, M.D.    Scheduled Meds: . acidophilus  1 capsule Oral Daily  . aspirin  81 mg Oral Daily  . ceFEPime (MAXIPIME) IV  1 g Intravenous Q8H  . citalopram  20 mg Oral Daily  . divalproex  500 mg Oral BID  . donepezil  10 mg Oral QHS  . felodipine  5 mg Oral Daily  . gabapentin  100 mg Oral TID  . haloperidol  0.5 mg Oral BID  . hydrochlorothiazide  25 mg Oral Daily  . memantine  5 mg Oral BID  . metFORMIN  500 mg Oral Q breakfast  . predniSONE  50 mg Oral Q breakfast  . vancomycin  1,000 mg Intravenous Q12H   Continuous Infusions: . sodium chloride 75 mL/hr at 09/21/12 1408

## 2012-09-22 NOTE — Progress Notes (Signed)
Occupational Therapy Evaluation Patient Details Name: Joel Summers MRN: [id number] DOB: [birth year] Today's Date: 09/22/2012 Time: 1478-2956 OT Time Calculation (min): 23 min  OT Assessment / Plan / Recommendation History of present illness Patient admitted from SNF with pneumonia and recent fall.   Clinical Impression   Patient presents to OT at min A level with ADLs. Patient resides at [facility] and has assistance available at discharge.    OT Assessment  Patient does not need any further OT services    Follow Up Recommendations  SNF    Barriers to Discharge      Equipment Recommendations  None recommended by OT    Recommendations for Other Services    Frequency       Precautions / Restrictions Precautions Precautions: Fall Restrictions Weight Bearing Restrictions: No   Pertinent Vitals/Pain     ADL  Grooming: Performed;Wash/dry hands;Wash/dry face;Set up Where Assessed - Grooming: Unsupported sitting;Supported standing Toilet Transfer: Performed;Min guard Toilet Transfer Method: Sit to Barista: Regular height toilet Toileting - Clothing Manipulation and Hygiene: Minimal assistance Where Assessed - Engineer, mining and Hygiene: Standing Equipment Used: Rolling walker Transfers/Ambulation Related to ADLs: Bed mobility supervision. Sit to stand min guard A. Ambulated with RW to and from BR with min guard A. ADL Comments: Patient requires cues for safety during ADLs and mobility.         OT Goals(Current goals can be found in the care plan section)    Visit Information  Last OT Received On: 09/22/12 Assistance Needed: +1 History of Present Illness: Patient admitted from SNF with pneumonia and recent fall.       Prior Functioning     Home Living Family/patient expects to be discharged to:: Skilled nursing facility Prior Function Level of Independence: Needs assistance Comments: unknown and patient unable to  state Communication Communication: No difficulties Dominant Hand: Right         Vision/Perception Vision - History Baseline Vision: Wears glasses all the time Patient Visual Report: Other (comment) (patient unable to report)   Cognition  Cognition Arousal/Alertness: Awake/alert Behavior During Therapy: WFL for tasks assessed/performed Overall Cognitive Status: History of cognitive impairments - at baseline Memory: Decreased short-term memory    Extremity/Trunk Assessment Upper Extremity Assessment Upper Extremity Assessment: Overall WFL for tasks assessed     End of Session OT - End of Session Equipment Utilized During Treatment: Rolling walker Activity Tolerance: Patient tolerated treatment well Patient left: in bed;with call bell/phone within reach;with bed alarm set Nurse Communication: Mobility status  GO     Leenah Seidner A 09/22/2012, 11:34 AM

## 2012-09-23 ENCOUNTER — Inpatient Hospital Stay (HOSPITAL_COMMUNITY): Payer: Medicare Other

## 2012-09-23 ENCOUNTER — Other Ambulatory Visit (HOSPITAL_COMMUNITY): Payer: Medicare Other

## 2012-09-23 DIAGNOSIS — J189 Pneumonia, unspecified organism: Secondary | ICD-10-CM

## 2012-09-23 LAB — VANCOMYCIN, TROUGH: Vancomycin Tr: 14 ug/mL (ref 10.0–20.0)

## 2012-09-23 LAB — CBC
HCT: 34.3 % — ABNORMAL LOW (ref 39.0–52.0)
Hemoglobin: 11.7 g/dL — ABNORMAL LOW (ref 13.0–17.0)
MCH: 30.9 pg (ref 26.0–34.0)
MCHC: 34.1 g/dL (ref 30.0–36.0)

## 2012-09-23 LAB — GLUCOSE, CAPILLARY
Glucose-Capillary: 183 mg/dL — ABNORMAL HIGH (ref 70–99)
Glucose-Capillary: 157 mg/dL — ABNORMAL HIGH (ref 70–99)
Glucose-Capillary: 170 mg/dL — ABNORMAL HIGH (ref 70–99)
Glucose-Capillary: 175 mg/dL — ABNORMAL HIGH (ref 70–99)

## 2012-09-23 LAB — BASIC METABOLIC PANEL
BUN: 9 mg/dL (ref 6–23)
Calcium: 8.8 mg/dL (ref 8.4–10.5)
GFR calc non Af Amer: >90 mL/min (ref >90)
Glucose, Bld: 240 mg/dL — ABNORMAL HIGH (ref 70–99)

## 2012-09-23 MED ORDER — PREDNISONE 20 MG PO TABS
40.0000 mg | ORAL_TABLET | Freq: Every day | ORAL | Status: DC
  Filled 2012-09-23 (×2): qty 2

## 2012-09-23 NOTE — Progress Notes (Signed)
Physical Therapy Treatment Patient Details Name: Joel Summers MRN: [id number] DOB: [birth year] Today's Date: 09/23/2012 Time: 0347-4259 PT Time Calculation (min): 30 min  PT Assessment / Plan / Recommendation  History of Present Illness Pt has been at a memory care assisted living, not a SNF   PT Comments   Pt found to have decreased O2 sats to 82% when walking on RA. He will benefit from continued PT at D/C -either HHPT at ALF if they can provide the extra care he needs, or short term SNF  Follow Up Recommendations  SNF;Supervision/Assistance - 24 hour;Home health PT (question if memory care unit can accommodate O2)     Does the patient have the potential to tolerate intense rehabilitation     Barriers to Discharge        Equipment Recommendations  Rolling walker with 5" wheels (pt may benefit from O2)    Recommendations for Other Services    Frequency Min 3X/week   Progress towards PT Goals Progress towards PT goals: Progressing toward goals  Plan Discharge plan needs to be updated    Precautions / Restrictions Precautions Precautions: Fall Restrictions Weight Bearing Restrictions: No   Pertinent Vitals/Pain sats decreased to 82% when walking on RA    Mobility  Bed Mobility Bed Mobility: Supine to Sit Supine to Sit: 5: Supervision Details for Bed Mobility Assistance: Supervision for safety.  Transfers Transfers: Sit to Stand;Stand to Sit Sit to Stand: 5: Supervision;From bed Stand to Sit: 5: Supervision;To chair/3-in-1 Details for Transfer Assistance: pt does not remember to push off with hands Ambulation/Gait Ambulation/Gait Assistance: 4: Min guard;5: Supervision Ambulation Distance (Feet): 175 Feet (x2 with O2 on second attempt) Assistive device: Rolling walker;1 person hand held assist Ambulation/Gait Assistance Details: pt needs to have hands on something or someone for balance assist.  Gait Pattern: Step-through pattern;Decreased stride  length;Shuffle;Trunk flexed;Festinating Gait velocity: decreased General Gait Details: Pt  continues to be unsure with RW, so attempted walking with hand hold assist.  Pt also had one hand on IV pole.  Without the walker in front of  him, he was able to stand up straighter and take longer steps.  On first attempt. pt walked without O2 and sats with amublation dropped to 82%.  On second attempt, pt ambuated on 2 L O2 and appeared to do better ( sat monitor not avaialbe to check sats)  Stairs: No Wheelchair Mobility Wheelchair Mobility: No    Exercises Other Exercises Other Exercises: repeated sit to stand x 5. Pt with fatigue after activity Other Exercises: standing balance with UE ROM acitivites.   PT Diagnosis:    PT Problem List:   PT Treatment Interventions:     PT Goals (current goals can now be found in the care plan section)    Visit Information  Last PT Received On: 09/23/12 History of Present Illness: Pt has been at a memory care assisted living, not a SNF    Subjective Data      Cognition  Cognition Arousal/Alertness: Awake/alert Behavior During Therapy: WFL for tasks assessed/performed Overall Cognitive Status: History of cognitive impairments - at baseline Memory: Decreased short-term memory    Balance  Balance Balance Assessed: Yes Static Sitting Balance Static Sitting - Balance Support: No upper extremity supported;Feet supported Static Sitting - Level of Assistance: 7: Independent Static Standing Balance Static Standing - Balance Support: Left upper extremity supported;During functional activity Static Standing - Level of Assistance: 5: Stand by assistance  End of Session PT - End of  Session Equipment Utilized During Treatment: Gait belt Activity Tolerance: Patient tolerated treatment well;Patient limited by fatigue Patient left: with call bell/phone within reach;with nursing/sitter in room;in bed;with bed alarm set Nurse Communication: Mobility status    GP    Bayard Hugger. Manson Passey, [place] [phone] 09/23/2012, 10:27 AM

## 2012-09-23 NOTE — Progress Notes (Signed)
Vancomycin Consult  Please refer to pharmacy notes written by Earl Many earlier today for more details.  Vancomycin trough returns as 14 ug/mL tonight on Vancomycin  1gm IV q12h for suspected pneumonia.   Plan: Okay to continue Vancomycin 1gm IV q12h.  Pharmacy will f/u  Geoffry Paradise, PharmD, BCPS Pager: [phone] 7:59 PM Pharmacy #: [id number]

## 2012-09-23 NOTE — Progress Notes (Signed)
Obtain details about lung cancer treatment from [facility], [place] [facility], [place] - DR Jonny Ruiz walker  old CT scans if available Get consent for bscopy from Melynda Keller, Interior and spatial designer of agency  [phone]  [facility]

## 2012-09-23 NOTE — Progress Notes (Signed)
PULMONARY  / CRITICAL CARE MEDICINE  Name: Joel Summers MRN: [id number] DOB: October 09, 1930    ADMISSION DATE:  09/20/2012 CONSULTATION DATE:  09/22/12  REFERRING MD :  Elisabeth Pigeon PRIMARY SERVICE:  [facility]  CHIEF COMPLAINT:  Eval abn CXR  BRIEF PATIENT DESCRIPTION:  79 y/o WM from nursing home, adm for SOB, cough, and abn CXR w/ extensive right sided changes from hx lung cancer & treatment (no details avail in Epic).  ?Aspiration raised w/ debris seen in right airways on CT Chest.  SIGNIFICANT EVENTS / STUDIES:    LINES / TUBES:   CULTURES: 8/8 Blood x2>>ng 8/8 Urine>>neg  ANTIBIOTICS: Cefepime 8/8>>  Vancomycin 8/8>>8/11   SUBJECTIVE: No acute events.  Remains NPO  VITAL SIGNS: Temp:  [97.5 F (36.4 C)-98 F (36.7 C)] 97.6 F (36.4 C) (08/11 0506) Pulse Rate:  [73-87] 73 (08/11 0506) Resp:  [16-24] 24 (08/11 0506) BP: (134-161)/(59-81) 151/59 mmHg (08/11 0506) SpO2:  [92 %] 92 % (08/11 0506) Weight:  [176 lb 4.8 oz (79.969 kg)] 176 lb 4.8 oz (79.969 kg) (08/11 0506)  PHYSICAL EXAMINATION: Constitutional: Elderly, appears chr ill. No distress.  HENT: Normocephalic. Dry mucus membranes.  Eyes: Conjunctivae and EOM are normal. PERRLA, no scleral icterus.  Neck:  No JVD. No tracheal deviation. No thyromegaly.  CVS: RRR, Gr1/6 SEM at lsb w/o rubs or gallops detected Lungs: decr BS on right, few scat rhonchi Abdominal: Soft & nontender w/o guarding or rebound Musculoskeletal: DJD- sl decr ROM. No edema and no tenderness.  Lymphadenopathy: No lymphadenopathy noted, cervical, inguinal. Neuro: Alert. Not oriented, no focal neurologic deficits  Skin: Skin is warm and dry.    Recent Labs Lab 09/20/12 2010 09/21/12 0352 09/23/12 0354  NA 138 136 137  K 3.6 3.9 3.0*  CL 96 97 96  CO2 33* 32 36*  BUN 15 13 9   CREATININE 0.72 0.59 0.58  GLUCOSE 158* 183* 240*    Recent Labs Lab 09/20/12 2010 09/21/12 0352 09/23/12 0354  HGB 11.5* 11.0* 11.7*  HCT 33.7* 31.9*  34.3*  WBC 20.9* 18.4* 15.2*  PLT 433* 406* 462*   Ct Chest W Contrast  09/21/2012   *RADIOLOGY REPORT*  Clinical Data: Evaluate right upper lobe lung mass. History lung cancer.  Elevated white blood cells.  Acute respiratory distress. History of dementia.  CT CHEST WITH CONTRAST  Technique:  Multidetector CT imaging of the chest was performed following the standard protocol during bolus administration of intravenous contrast.  Contrast: 80mL OMNIPAQUE IOHEXOL 300 MG/ML  SOLN  Comparison: Plain film of 09/20/2012.  No prior CT.  Findings: Lungs/pleura: Mild motion degradation.  Material in the right mainstem bronchus on image 38/series 6 and lower lobe bronchi on image 43/series 6.  Right greater than left patchy bibasilar airspace disease.  Right suprahilar confluent pulmonary opacity with bronchiectasis.  This is identified lateral and anterior to surgical sutures, including on image 39/series 6.  Trace left pleural fluid.  Small right-sided pleural effusion with minimal wall thickening or loculation anteriorly.  Surgical sutures about right lower lobe bronchi.  Heart/Mediastinum: Mild cardiomegaly.  Coronary artery atherosclerosis.  Main pulmonary artery enlargement. No central pulmonary embolism, on this non-dedicated study.  Mediastinal deviation to the right with volume loss in the right hemithorax.  Right paratracheal node 1.2 cm on image 28. Subcarinal node 1.7 cm on image 41.  Upper abdomen: Fatty atrophy of the pancreas.  Normal imaged adrenal glands.  Bones/Musculoskeletal:  Postsurgical or post-traumatic defects of right-sided ribs.  IMPRESSION:  1.  Findings which are highly suspicious for aspiration.  Debris within the right-sided endobronchial tree with bibasilar, right greater than left patchy airspace opacities. 2.  Right-sided pulmonary process which is primarily felt to be treatment/radiation related.  No convincing evidence of dominant residual or recurrent primary tumor.  CT follow-up and  consideration of eventual PET may be informative.  In addition, comparison with prior exams would be useful. 3.  Thoracic adenopathy, for which which metastatic disease is suspected.  Alternatively, these nodes could be reactive. 4.  Right greater than left bilateral pleural effusions. 5. Pulmonary artery enlargement suggests pulmonary arterial hypertension.   Original Report Authenticated By: Jeronimo Greaves, M.D.         ASSESSMENT / PLAN:  1)  COPD - records indicate he was on Proventil 2mg Tid, ProventilHFA prn, Robitussin, Pred20mg  & Augmentin/ Levaquin recently in the NH 2)  Hx Lung Cancer w/ surg & ?XRT- we do not have any records or details & this would obviously be of signif value in his current assessment 3)  Poss aspiration w/ debris in right sided airways - he is not in distress & not coughing at present; will consider bronchoscopy w/ lavage  Plan: - consider bronch w/ lavage.  However, given his advanced dementia, not sure that he would be a candidate for chemo / XRT or if family would want?? - needs swallowing eval , OK to advance diet based on eval - agree w/ NEBS=> try to get sput for cult - agree w/ empiric Maxipime, Vanco coverage for now - wean Pred rapidly (looks like it was started in the NH), reduced 8/11 to 40 QD - careful w/ diuretic> watch renal & K - background medical data, details of his prev lung cancer therapy, etc are very important & every effort needs to be made to obtain this info from family, NH, prev hosp etc- I have left message for guardian Pershing Cox, NP-C [facility] Pgr: [phone] or [phone]  Independently examined pt, evaluated data & formulated above care plan with NP  [facility].

## 2012-09-23 NOTE — Progress Notes (Signed)
Patient needs a consent for Bronchoscopy and release of medical record, message left with Ms Melynda Keller, 2x.

## 2012-09-23 NOTE — Progress Notes (Signed)
Clinical Social Work Department BRIEF PSYCHOSOCIAL ASSESSMENT 09/23/2012  Patient:  Joel Summers, Joel Summers     Account Number:  [id number]     Admit date:  09/20/2012  Clinical Social Worker:  Jacelyn Grip  Date/Time:  09/23/2012 11:30 AM  Referred by:  Physician  Date Referred:  09/23/2012 Referred for  SNF Placement   Other Referral:   Interview type:  Other - See comment Other interview type:   Pt has legal guardian through [facility] Department of Social Services    PSYCHOSOCIAL DATA Living Status:  FACILITY Admitted from facility:  [facility] Level of care:  Assisted Living Primary support name:  Markham Jordan Carr/Legal Guardian/[phone] Primary support relationship to patient:  NONE Degree of support available:   adequate    CURRENT CONCERNS Current Concerns  Post-Acute Placement   Other Concerns:    SOCIAL WORK ASSESSMENT / PLAN CSW received referral that pt admitted from facility.    CSW received phone call from [facility] DSS legal guardian, [name] who stated that pt is a resident at [facility]F memory care unit. Legal guardian would be agreeable to pt returning to [facility] ALF as long as ALF feels they can continue to meet pt needs.    CSW discussed with MD and PT and MD feels pt could return to memory care unit. PT saw pt and felt pt could return to ALF as long as facility felt they could continue to meet pt needs because pt may need oxygen at discharge.    CSW contacted [facility] and discussed pt current clinical situation and PT evaluation and treatment. [facility] ALF feels that they can continue to meet pt needs in memory care unit. [facility] states that they use [place] for home health services and O2 services if pt needs O2.    CSW discussed with legal guardian, [name] and legal guardian agreeable to [place] for home health services.    CSW  will continue to update [facility].    Per MD, pt not yet medically ready for discharge.    CSW to continue to follow to assist with pt discharge back to [facility]F when pt medically stable.   Assessment/plan status:  Psychosocial Support/Ongoing Assessment of Needs Other assessment/ plan:   discharge planning   Information/referral to community resources:   Referral to [facility] for [facility] needs at ALF; Referral back to [facility]    PATIENT'S/FAMILY'S RESPONSE TO PLAN OF CARE: Per chart, pt oriented to person only and has a hx of dementia. Pt is a resident at [facility]F in a dementia unit and current plan is for pt to return to facility.    Per pt legal guardian, [name], pt daughter Luther Redo can speak with pt, but medical information should not be provided to pt daughter and if pt daughter is seeking medical information then her questions should be deferred to pt legal guardain.     Jacklynn Lewis, MSW, LCSWA  Clinical Social Work [phone]

## 2012-09-23 NOTE — Progress Notes (Signed)
ANTIBIOTIC CONSULT NOTE - Follow Up  Pharmacy Consult for:  Vancomycin Indication:  Suspected pneumonia (HCAP vs aspiration PNA)  Allergies  Allergen Reactions  . Ativan (Lorazepam)     Patient Measurements: Height: 5\' 9"  (175.3 cm) Weight: 176 lb 4.8 oz (79.969 kg) IBW/kg (Calculated) : 70.7  Vital Signs: Temp: 97.6 F (36.4 C) (08/11 0506) Temp src: Oral (08/11 0506) BP: 151/59 mmHg (08/11 0506) Pulse Rate: 73 (08/11 0506)   Labs:  Recent Labs  09/20/12 2010 09/21/12 0352 09/23/12 0354  WBC 20.9* 18.4* 15.2*  HGB 11.5* 11.0* 11.7*  PLT 433* 406* 462*  CREATININE 0.72 0.59 0.58   Estimated Creatinine Clearance: 73.6 ml/min (by C-G formula based on Cr of 0.58).    Medical History: Past Medical History  Diagnosis Date  . Altered mental status   . COPD (chronic obstructive pulmonary disease)   . Diabetes mellitus without complication   . Hypertension   . Cancer   . Lung cancer     Medications:  Scheduled:  . acidophilus  1 capsule Oral Daily  . aspirin  81 mg Oral Daily  . ceFEPime (MAXIPIME) IV  1 g Intravenous Q8H  . citalopram  20 mg Oral Daily  . divalproex  500 mg Oral BID  . donepezil  10 mg Oral QHS  . felodipine  5 mg Oral Daily  . gabapentin  100 mg Oral TID  . haloperidol  0.5 mg Oral BID  . hydrochlorothiazide  25 mg Oral Daily  . memantine  5 mg Oral BID  . metFORMIN  500 mg Oral Q breakfast  . predniSONE  50 mg Oral Q breakfast  . vancomycin  1,000 mg Intravenous Q12H   Assessment:  D4/8 Vancomycin/Cefepime for HCAP vs aspiration PNA  Improved WBC  Afebrile  Stable renal function  Goal of Therapy:  Vancomycin trough levels 15-20 mcg/ml Eradication of infection  Plan:  1) Continue vancomycin 1g q12 for now - will check a trough prior to next dose tonight 2) Continue cefepime 1g IV q8  Hessie Knows, PharmD, BCPS Pager [phone] 09/23/2012 8:37 AM

## 2012-09-23 NOTE — Progress Notes (Signed)
[facility] PROGRESS NOTE  DEVION CHRISCOE WUJ:[id number] DOB: 26-Jan-1931 DOA: 09/20/2012 PCP: Ron Parker, MD  Brief narrative: 79 year old male with past medical history of hypertension, dementia, COPD, diabetes who presented to [facility] ED from SNF with worsening shortness of breath with productive cough. Patient is unable to provide the details of his medical history due to dementia.  In ED, vital signs are stable with BP 135/94, O2 saturation 97% on room air and T 99.6 F. CXR revealed right upper lung lobe mass consistent with history of lung cancer. No preevios CXR available in EPIC for comparison. No previous records of history of lung cancer or treatments. CBC revealed WBC count of 14.5 and 18.8, hemoglobin of 11.7. BMP revealed potassium of 3.3 which normalized spontaneously to 3.9. CT head and cervical spine showed no acute intracranial findings. We further obtained CT chest for evaluation of right upper lung mass which was significant for possible aspiration in addition to debris within the right-sided endobronchial tree with bibasilar opacities, right-sided pulmonary process which is primarily felt to be treatment/radiation related. No convincing evidence of dominant residual or recurrent primary tumor.   Assessment and Plan:   Principal Problem:  *Acute respiratory distress  - not hypoxic, saturation is 94% on room air  - likely due to HCAP or aspiration pneumonia/post obstructive penumonia related to lung mass/lung ca  - continue vanco and cefepime  - continue albuterol and Atrovent nebulizers as needed every 2 hours  - continue prednisone 40 mg daily  - oxygen support via nasal canula to keep O2 saturation above 90%  - appreciate PCCM consult and recommendations in regards to lung mass COPD (chronic obstructive pulmonary disease)  - COPD order set in place  - management as above with steroids (PO), nebulizer treatments PRN and antibiotics  HCAP (healthcare-associated pneumonia)   - pneumonia order set in place  - follow up blood and resp culture results - negative to date - legionella and strep pneumonia - negative  - continue vanco and cefepime  Active Problems:  Anemia  - stable hemoglobin at 11.7 --> 11.0 --> 11.7 - no indications for transfusion  Hypokalemia  - resolved to 3.9 without supplementation  Leukocytosis  - likely due to steroids and pneumonia  - continue to monitor; WBC count trending down, 20.9 -->15.2 Dementia  - continue aricept and namenda  Lung mass  - right upper lung lobe  - CT finding as above significant for possible aspiration in addition to debris within the right-sided endobronchial tree with bibasilar opacities, right-sided pulmonary process which is primarily felt to be treatment/radiation related. No convincing evidence of dominant residual or recurrent primary tumor.  - appreciate PCCM consult and recommendations; plan for bronch and lavage DM (diabetes mellitus)  - continue metformin 500 mg daily  HTN (hypertension)  - continue Hctz   Code Status: full code  Family Communication: no family at the bedside  Disposition Plan: to ALF if 24 hour supervision provided  Manson Passey, MD  [facility]  Pager [phone]   Consultants:  PCCM Procedures:  None  Antibiotics:  Vanco 09/20/2012 -->  Cefepime 09/20/2012 -->   If 7PM-7AM, please contact night-coverage [email or web address] Password [email or web address] 09/23/2012, 2:26 PM   LOS: 3 days    HPI/Subjective: No acute overnight events.  Objective: Filed Vitals:   09/22/12 1642 09/22/12 2058 09/23/12 0506 09/23/12 1335  BP: 134/66 161/81 151/59 149/74  Pulse: 85 87 73 88  Temp: 98 F (36.7 C) 97.5 F (36.4 C) 97.6 F (36.4  C) 98 F (36.7 C)  TempSrc: Oral Oral Oral Oral  Resp: 16 16 24 18   Height:      Weight:   79.969 kg (176 lb 4.8 oz)   SpO2: 92% 92% 92% 97%    Intake/Output Summary (Last 24 hours) at 09/23/12 1426 Last data filed at 09/23/12 1210  Gross per 24 hour  Intake       0 ml  Output   1300 ml  Net  -1300 ml    Exam:   General:  Pt is alert, follows commands appropriately, not in acute distress  Cardiovascular: Regular rate and rhythm, S1/S2, no murmurs, no rubs, no gallops  Respiratory: no wheezing, diminished breath sounds  Abdomen: Soft, non tender, non distended, bowel sounds present, no guarding  Extremities: No edema, pulses DP and PT palpable bilaterally  Neuro: Grossly nonfocal  Data Reviewed: Basic Metabolic Panel:  Recent Labs Lab 09/20/12 0410 09/20/12 1510 09/20/12 2010 09/21/12 0352 09/23/12 0354  NA 136 137 138 136 137  K 3.3* 3.9 3.6 3.9 3.0*  CL 95* 95* 96 97 96  CO2 30 31 33* 32 36*  GLUCOSE 188* 207* 158* 183* 240*  BUN 18 18 15 13 9   CREATININE 0.82 0.83 0.72 0.59 0.58  CALCIUM 8.8 8.8 8.9 8.9 8.8  MG  --   --  1.8  --   --   PHOS  --   --  2.9  --   --    Liver Function Tests:  Recent Labs Lab 09/20/12 1510 09/20/12 2010 09/21/12 0352  AST 19 18 21   ALT 19 18 16   ALKPHOS 91 92 93  BILITOT 0.5 0.3 0.3  PROT 6.5 6.6 6.1  ALBUMIN 2.4* 2.4* 2.2*   No results found for this basename: LIPASE, AMYLASE,  in the last 168 hours No results found for this basename: AMMONIA,  in the last 168 hours CBC:  Recent Labs Lab 09/20/12 0410 09/20/12 1510 09/20/12 2010 09/21/12 0352 09/23/12 0354  WBC 14.5* 18.8* 20.9* 18.4* 15.2*  NEUTROABS 12.2* 15.8* 17.3*  --   --   HGB 11.7* 11.4* 11.5* 11.0* 11.7*  HCT 33.7* 33.2* 33.7* 31.9* 34.3*  MCV 89.2 89.5 90.1 89.9 90.5  PLT 410* 453* 433* 406* 462*   Cardiac Enzymes: No results found for this basename: CKTOTAL, CKMB, CKMBINDEX, TROPONINI,  in the last 168 hours BNP: No components found with this basename: POCBNP,  CBG:  Recent Labs Lab 09/22/12 1949 09/22/12 2339 09/23/12 0354 09/23/12 0749 09/23/12 1139  GLUCAP 195* 209* 211* 183* 157*    Recent Results (from the past 240 hour(s))  URINE CULTURE     Status: None   Collection Time    09/20/12   4:52 PM      Result Value Range Status   Specimen Description URINE, RANDOM   Final   Special Requests NONE   Final   Culture  Setup Time     Final   Value: 09/20/2012 22:14     Performed at [facility] Count     Final   Value: NO GROWTH     Performed at [facility]   Culture     Final   Value: NO GROWTH     Performed at [facility]   Report Status 09/21/2012 FINAL   Final  CULTURE, BLOOD (ROUTINE X 2)     Status: None   Collection Time    09/20/12  5:49 PM  Result Value Range Status   Specimen Description BLOOD RIGHT ARM   Final   Special Requests BOTTLES DRAWN AEROBIC AND ANAEROBIC   Final   Culture  Setup Time     Final   Value: 09/21/2012 03:01     Performed at [facility]   Culture     Final   Value:        BLOOD CULTURE RECEIVED NO GROWTH TO DATE CULTURE WILL BE HELD FOR 5 DAYS BEFORE ISSUING A FINAL NEGATIVE REPORT     Performed at [facility]   Report Status PENDING   Incomplete  CULTURE, BLOOD (ROUTINE X 2)     Status: None   Collection Time    09/20/12  5:58 PM      Result Value Range Status   Specimen Description BLOOD RIGHT HAND   Final   Special Requests BOTTLES DRAWN AEROBIC AND ANAEROBIC   Final   Culture  Setup Time     Final   Value: 09/21/2012 03:01     Performed at [facility]   Culture     Final   Value:        BLOOD CULTURE RECEIVED NO GROWTH TO DATE CULTURE WILL BE HELD FOR 5 DAYS BEFORE ISSUING A FINAL NEGATIVE REPORT     Performed at [facility]   Report Status PENDING   Incomplete  MRSA PCR SCREENING     Status: None   Collection Time    09/20/12 11:02 PM      Result Value Range Status   MRSA by PCR NEGATIVE  NEGATIVE Final   Comment:            The GeneXpert MRSA Assay (FDA     approved for NASAL specimens     only), is one component of a     comprehensive MRSA colonization     surveillance program. It is not     intended to diagnose MRSA     infection nor  to guide or     monitor treatment for     MRSA infections.     Studies: Ct Chest W Contrast  09/21/2012   *RADIOLOGY REPORT*  Clinical Data: Evaluate right upper lobe lung mass. History lung cancer.  Elevated white blood cells.  Acute respiratory distress. History of dementia.  CT CHEST WITH CONTRAST  Technique:  Multidetector CT imaging of the chest was performed following the standard protocol during bolus administration of intravenous contrast.  Contrast: 80mL OMNIPAQUE IOHEXOL 300 MG/ML  SOLN  Comparison: Plain film of 09/20/2012.  No prior CT.  Findings: Lungs/pleura: Mild motion degradation.  Material in the right mainstem bronchus on image 38/series 6 and lower lobe bronchi on image 43/series 6.  Right greater than left patchy bibasilar airspace disease.  Right suprahilar confluent pulmonary opacity with bronchiectasis.  This is identified lateral and anterior to surgical sutures, including on image 39/series 6.  Trace left pleural fluid.  Small right-sided pleural effusion with minimal wall thickening or loculation anteriorly.  Surgical sutures about right lower lobe bronchi.  Heart/Mediastinum: Mild cardiomegaly.  Coronary artery atherosclerosis.  Main pulmonary artery enlargement. No central pulmonary embolism, on this non-dedicated study.  Mediastinal deviation to the right with volume loss in the right hemithorax.  Right paratracheal node 1.2 cm on image 28. Subcarinal node 1.7 cm on image 41.  Upper abdomen: Fatty atrophy of the pancreas.  Normal imaged adrenal glands.  Bones/Musculoskeletal:  Postsurgical or post-traumatic defects  of right-sided ribs.  IMPRESSION:  1.  Findings which are highly suspicious for aspiration.  Debris within the right-sided endobronchial tree with bibasilar, right greater than left patchy airspace opacities. 2.  Right-sided pulmonary process which is primarily felt to be treatment/radiation related.  No convincing evidence of dominant residual or recurrent primary  tumor.  CT follow-up and consideration of eventual PET may be informative.  In addition, comparison with prior exams would be useful. 3.  Thoracic adenopathy, for which which metastatic disease is suspected.  Alternatively, these nodes could be reactive. 4.  Right greater than left bilateral pleural effusions. 5. Pulmonary artery enlargement suggests pulmonary arterial hypertension.   Original Report Authenticated By: Jeronimo Greaves, M.D.    Scheduled Meds: . acidophilus  1 capsule Oral Daily  . aspirin  81 mg Oral Daily  . ceFEPime (MAXIPIME) IV  1 g Intravenous Q8H  . citalopram  20 mg Oral Daily  . divalproex  500 mg Oral BID  . donepezil  10 mg Oral QHS  . felodipine  5 mg Oral Daily  . gabapentin  100 mg Oral TID  . haloperidol  0.5 mg Oral BID  . hydrochlorothiazide  25 mg Oral Daily  . memantine  5 mg Oral BID  . metFORMIN  500 mg Oral Q breakfast  . [START ON 09/24/2012] predniSONE  40 mg Oral Q breakfast  . vancomycin  1,000 mg Intravenous Q12H   Continuous Infusions: . dextrose 5 % and 0.9% NaCl 100 mL/hr at 09/22/12 1632

## 2012-09-23 NOTE — Evaluation (Addendum)
Clinical/Bedside Swallow Evaluation Patient Details  Name: Joel Summers MRN: [id number] Date of Birth: [birth year]  Today's Date: 09/23/2012 Time: 1250-1314 SLP Time Calculation (min): 24 min  Past Medical History:  Past Medical History  Diagnosis Date  . Altered mental status   . COPD (chronic obstructive pulmonary disease)   . Diabetes mellitus without complication   . Hypertension   . Cancer   . Lung cancer    Past Surgical History: History reviewed. No pertinent past surgical history. HPI:  79 yo resident of SNF admitted to [facility] after falling at SNF, PMH + for HTN, COPD, dementia.  Pt CT head showed hematoma over frontal bone, atrophy.  Pt with ? debris in right lung concerning for aspiration.  Pt denies dysphagia but given his dementia, uncertain of ability to recall information.    Assessment / Plan / Recommendation Clinical Impression  Pt presents with clinical indications of possible moderate to severe oropharyngeal dysphagia with concern for aspiration of consistencies provided.  Delayed oral transiting, decreased laryngeal elevation palpated at bedside and throat clearing - delayed weak cough noted across consistencies.  Concern is present for amount of oropharyngeal stasis that may be present and overt indications of possible aspiration.    Would recommend to proceed with MBS to allow instrumental assessment and determine if pt can tolerate any po.      Aspiration Risk  Severe    Diet Recommendation NPO -oral care       Other  Recommendations Recommended Consults: MBS Oral Care Recommendations: Oral care BID   Follow Up Recommendations  Skilled Nursing facility    Frequency and Duration   TBD after MBS     Pertinent Vitals/Pain Afebrile, decreased-congested cough    SLP Swallow Goals     Swallow Study Prior Functional Status   pt resides at SNF    General HPI: 79 yo resident of SNF admitted to [facility] after falling at SNF, PMH + for HTN, COPD, dementia.   Pt CT head showed hematoma over frontal bone, atrophy.  Pt with ? debris in right lung concerning for aspiration.  Pt denies dysphagia but given his dementia, uncertain of ability to recall information.  Type of Study: Bedside swallow evaluation Diet Prior to this Study: NPO Temperature Spikes Noted: No Respiratory Status: Supplemental O2 delivered via (comment) History of Recent Intubation: No Behavior/Cognition: Alert;Confused;Requires cueing;Distractible Oral Cavity - Dentition: Edentulous (does not have dentures per pt) Self-Feeding Abilities: Needs assist Patient Positioning: Upright in bed Baseline Vocal Quality: Low vocal intensity Volitional Cough: Weak Volitional Swallow: Unable to elicit    Oral/Motor/Sensory Function Overall Oral Motor/Sensory Function:  (generalized weakness, no focal deficits, dysarthric)   Ice Chips Ice chips: Not tested   Thin Liquid Thin Liquid: Not tested    Nectar Thick Nectar Thick Liquid: Impaired Presentation: Cup;Self Fed;Spoon Oral Phase Impairments: Reduced lingual movement/coordination;Impaired anterior to posterior transit Oral phase functional implications: Prolonged oral transit Pharyngeal Phase Impairments: Suspected delayed Swallow;Throat Clearing - Delayed;Multiple swallows;Throat Clearing - Immediate;Decreased hyoid-laryngeal movement   Honey Thick Honey Thick Liquid: Not tested   Puree Puree: Impaired Presentation: Spoon Oral Phase Impairments: Reduced lingual movement/coordination;Impaired anterior to posterior transit Oral Phase Functional Implications: Prolonged oral transit Pharyngeal Phase Impairments: Suspected delayed Swallow;Multiple swallows;Throat Clearing - Delayed   Solid   GO    Solid: Not tested       Mills Koller, MS [facility] SLP [phone]

## 2012-09-23 NOTE — Procedures (Signed)
Objective Swallowing Evaluation: Modified Barium Swallowing Study  Patient Details  Name: Joel Summers MRN: [id number] Date of Birth: Jul 13, 1930  Today's Date: 09/23/2012 Time: 7829-5621 SLP Time Calculation (min): 30 min  Past Medical History:  Past Medical History  Diagnosis Date  . Altered mental status   . COPD (chronic obstructive pulmonary disease)   . Diabetes mellitus without complication   . Hypertension   . Cancer   . Lung cancer    Past Surgical History: History reviewed. No pertinent past surgical history. HPI:  79 yo resident of ALF [facility])  admitted to [facility] after falling-pt found to have pna.  PMH + for HTN, COPD, dementia.  Pt CT head showed hematoma over frontal bone, atrophy.  Pt with possible debris in right lung concerning for aspiration.  Pt denies dysphagia but given his dementia, uncertain of ability to recall information.  BSE completed with indications for MBS and order was obtained.       Assessment / Plan / Recommendation Clinical Impression  Dysphagia Diagnosis: Moderate oral phase dysphagia;Moderate pharyngeal phase dysphagia (suspect esophageal component as well)   Clinical impression: Pt presents with moderate oropharyngeal sensorimotor dysphagia with decreased oral bolus containment from weakness resulting in premature spillage of barium into pharynx and oral residuals (mostly with liquids) without pt sensation.  Pt's pharyngeal swallow characterized by delayed initiation (cracker accumulating in vallecular space for 4 seconds prior to swallow) and liquids spilled to pyriform sinus prior to swallow.  Decreased tongue base retraction, laryngeal elevation, pharyngeal contraction result in pharyngeal stasis with liquids and barium tablet WITHOUT awareness and laryngeal penetration of both nectar/thin consistencies.  Chin tuck not helpful and cued cough was WEAK and did not clear penetrates.  Barium tablet given with pudding precariously  lodged at vallecular space without pt awareness, thin aided clearance but with penetration.   Given pt's increased dyspenia with effort, delayed swallow responses, no awareness to stasis and weak nonprotective cough, he is deemed a high aspiration risk with po.  Suspect pt is aspirating secretions as barium mixed with secretions from oral cavity and were penetrated.  Skilled intervention included educating pt to findings via video monitor, providing visual and verbal feedback to compensation strategies.    Options may be to allow po with known risk and strategies to mitigate risk or keep npo except medicine crushed with applesauce and ice prn after oral care.   Would need repeat MBS after medical/respiratory improvement due to sensory motor deficits.  Unfortunately pt coughed throughout entire MBS making clinical evaluation inaccurate and pt's cough is weak and nonprotective.    SLP phoned ALF after MBS and spoke to [place], resident care Production designer, theatre/television/film, who reported pt was on a regular/thin diet and took medications with water with good tolerance as far as she knew.  Grover Canavan stated pt did not have significant weight loss nor pulmonary infections recently as far as she knew.         Treatment Recommendation  Therapy as outlined in treatment plan below    Diet Recommendation NPO;Ice chips PRN after oral care (vs po of pureed/thin with risks accepted)   Medication Administration: Crushed with puree    Other  Recommendations Recommended Consults: MBS Oral Care Recommendations: Oral care BID   Follow Up Recommendations  Skilled Nursing facility    Frequency and Duration min 2x/week  2 weeks   Pertinent Vitals/Pain Afebrile, nonproductive congested cough, pt unable to clear, occasional wet voice-? Asp of secretions    SLP Swallow  Goals Patient will utilize recommended strategies during swallow to increase swallowing safety with: Total assistance Goal #3: Pt and MD will determine if desire for pt to  eat with known aspiration risk and strategies to mitigate it.     General HPI: 79 yo resident of ALF [facility])  admitted to [facility] after falling-pt found to have pna.  PMH + for HTN, COPD, dementia.  Pt CT head showed hematoma over frontal bone, atrophy.  Pt with possible debris in right lung concerning for aspiration.  Pt denies dysphagia but given his dementia, uncertain of ability to recall information.  BSE completed with indications for MBS and order was obtained.   Type of Study: Modified Barium Swallowing Study Reason for Referral: Objectively evaluate swallowing function Previous Swallow Assessment: none in epic Diet Prior to this Study: NPO Temperature Spikes Noted: No Respiratory Status: Supplemental O2 delivered via (comment) History of Recent Intubation: No Behavior/Cognition: Alert;Cooperative;Confused;Impulsive;Distractible;Decreased sustained attention Oral Cavity - Dentition: Edentulous Oral Motor / Sensory Function: Impaired - see Bedside swallow eval Self-Feeding Abilities: Needs assist Patient Positioning: Upright in chair Baseline Vocal Quality: Low vocal intensity Volitional Cough: Weak;Congested Volitional Swallow: Able to elicit Anatomy: Within functional limits Pharyngeal Secretions: Standing secretions in (comment)    Reason for Referral Objectively evaluate swallowing function   Oral Phase Oral Preparation/Oral Phase Oral Phase: Impaired Oral - Nectar Oral - Nectar Teaspoon: Reduced posterior propulsion;Piecemeal swallowing;Delayed oral transit;Weak lingual manipulation Oral - Nectar Cup: Reduced posterior propulsion;Piecemeal swallowing;Delayed oral transit;Weak lingual manipulation Oral - Thin Oral - Thin Teaspoon: Delayed oral transit;Piecemeal swallowing;Weak lingual manipulation Oral - Thin Cup: Piecemeal swallowing;Reduced posterior propulsion;Weak lingual manipulation;Lingual/palatal residue Oral - Thin Straw: Piecemeal swallowing;Reduced  posterior propulsion;Weak lingual manipulation;Lingual/palatal residue Oral - Solids Oral - Puree: Reduced posterior propulsion;Delayed oral transit;Weak lingual manipulation Oral - Regular: Delayed oral transit;Impaired mastication;Reduced posterior propulsion;Lingual/palatal residue;Weak lingual manipulation Oral - Pill: Reduced posterior propulsion;Delayed oral transit;Weak lingual manipulation Oral Phase - Comment Oral Phase - Comment: oral residuals prematurely spill into pharynx without pt awareness- at times pt required cues to swallow and response was delayed   Pharyngeal Phase Pharyngeal Phase Pharyngeal Phase: Impaired Pharyngeal - Nectar Pharyngeal - Nectar Teaspoon: Delayed swallow initiation;Pharyngeal residue - valleculae;Reduced laryngeal elevation;Reduced airway/laryngeal closure;Reduced tongue base retraction;Reduced pharyngeal peristalsis Pharyngeal - Nectar Cup: Delayed swallow initiation;Premature spillage to pyriform sinuses;Reduced airway/laryngeal closure;Reduced laryngeal elevation;Reduced tongue base retraction;Pharyngeal residue - valleculae;Pharyngeal residue - pyriform sinuses Pharyngeal - Thin Pharyngeal - Thin Teaspoon: Premature spillage to pyriform sinuses;Reduced airway/laryngeal closure;Reduced laryngeal elevation;Delayed swallow initiation;Reduced pharyngeal peristalsis;Pharyngeal residue - valleculae (swallow triggered at approx 2 seconds at vallecular space) Pharyngeal - Thin Cup: Premature spillage to valleculae;Delayed swallow initiation;Premature spillage to pyriform sinuses;Penetration/Aspiration during swallow;Reduced laryngeal elevation;Reduced airway/laryngeal closure;Reduced tongue base retraction;Reduced pharyngeal peristalsis;Pharyngeal residue - valleculae Penetration/Aspiration details (thin cup): Material enters airway, remains ABOVE vocal cords and not ejected out Pharyngeal - Thin Straw: Delayed swallow initiation;Premature spillage to pyriform  sinuses;Penetration/Aspiration during swallow;Penetration/Aspiration after swallow;Reduced airway/laryngeal closure;Reduced laryngeal elevation;Reduced pharyngeal peristalsis Penetration/Aspiration details (thin straw): Material enters airway, remains ABOVE vocal cords and not ejected out Pharyngeal - Solids Pharyngeal - Puree: Delayed swallow initiation;Premature spillage to valleculae (trace base of tongue stasis without awarness) Pharyngeal - Regular: Delayed swallow initiation;Premature spillage to valleculae (delay up to 4 seconds filling vallecular space) Pharyngeal - Pill: Delayed swallow initiation;Pharyngeal residue - valleculae;Premature spillage to valleculae;Reduced tongue base retraction;Reduced laryngeal elevation;Reduced airway/laryngeal closure (pt did not sense barium tablet lodged in pharynx) Pharyngeal Phase - Comment Pharyngeal Comment: pt with delayed pharyngeal  swallow initiation and no awareness to tongue base and vallecular stasis, cued dry swallows helpful but pt was delayed in performance and would likely be taxing for pt with COPD, cued cough did not fully clear trace penetration, chin tuck did not prevent penetration of thin was difficult for pt to perform, nectar liquids resulted in more stasis than thin, pharyngeal swallow strong with solid and puree without stasis   Cervical Esophageal Phase    GO    Cervical Esophageal Phase Cervical Esophageal Phase: Impaired Cervical Esophageal Phase - Comment Cervical Esophageal Comment: After pt consumed a very small amount of nectar barium *one cup sip and one tsp, pt appeared with significant distal stasis without awareness that did not clear, swallowing water boluses aided clearance; further boluses of thin resulted in distal stasis - again requiring water bolus to clear.  Pt appeared to clear puree, cracker and barium tablet in timely fashion.  Would recommend strict esophageal/reflux precautions for this pt.          Donavan Burnet, MS [facility] SLP [phone]

## 2012-09-23 NOTE — Progress Notes (Signed)
Inpatient Diabetes Program Recommendations  AACE/ADA: New Consensus Statement on Inpatient Glycemic Control (2013)  Target Ranges:  Prepandial:   less than 140 mg/dL      Peak postprandial:   less than 180 mg/dL (1-2 hours)      Critically ill patients:  140 - 180 mg/dL   Reason for Visit: Hyperglycemia  Results for MALEK, SKOG (MRN [id number]) as of 09/23/2012 15:21  Ref. Range 09/21/2012 03:52 09/23/2012 03:54  Glucose Latest Range: 70-99 mg/dL 086 (H) 578 (H)  Results for SHAMAN, MUSCARELLA (MRN [id number]) as of 09/23/2012 15:21  Ref. Range 09/22/2012 19:49 09/22/2012 23:39 09/23/2012 03:54 09/23/2012 07:49 09/23/2012 11:39  Glucose-Capillary Latest Range: 70-99 mg/dL 413 (H) 244 (H) 010 (H) 183 (H) 157 (H)   Hyperglycemia likely d/t steroids.   Inpatient Diabetes Program Recommendations Correction (SSI): Consider addition of Novolog sensitive Q4 while NPO  Will continue to follow. Thank you. Ailene Ards, RD, LDN, CDE Inpatient Diabetes Coordinator [phone]

## 2012-09-24 ENCOUNTER — Encounter (HOSPITAL_COMMUNITY): Payer: Self-pay | Admitting: Respiratory Therapy

## 2012-09-24 ENCOUNTER — Encounter (HOSPITAL_COMMUNITY)
Admission: EM | Disposition: A | Discharge status: Skilled Nursing Facility | Payer: Self-pay | Source: Home / Self Care | Attending: Internal Medicine

## 2012-09-24 ENCOUNTER — Inpatient Hospital Stay (HOSPITAL_COMMUNITY): Payer: Medicare Other

## 2012-09-24 HISTORY — PX: VIDEO BRONCHOSCOPY: SHX5072

## 2012-09-24 LAB — CBC
HCT: 34.6 % — ABNORMAL LOW (ref 39.0–52.0)
MCHC: 33.8 g/dL (ref 30.0–36.0)
Platelets: 490 10*3/uL — ABNORMAL HIGH (ref 150–400)
RDW: 14 % (ref 11.5–15.5)
WBC: 10.4 10*3/uL (ref 4.0–10.5)

## 2012-09-24 LAB — URINALYSIS, ROUTINE W REFLEX MICROSCOPIC
Glucose, UA: NEGATIVE mg/dL
Leukocytes, UA: NEGATIVE
Protein, ur: NEGATIVE mg/dL
Specific Gravity, Urine: 1.009 (ref 1.005–1.030)

## 2012-09-24 LAB — GLUCOSE, CAPILLARY
Glucose-Capillary: 191 mg/dL — ABNORMAL HIGH (ref 70–99)
Glucose-Capillary: 194 mg/dL — ABNORMAL HIGH (ref 70–99)

## 2012-09-24 LAB — BASIC METABOLIC PANEL
BUN: 5 mg/dL — ABNORMAL LOW (ref 6–23)
Chloride: 94 mEq/L — ABNORMAL LOW (ref 96–112)
Creatinine, Ser: 0.55 mg/dL (ref 0.50–1.35)
GFR calc Af Amer: >90 mL/min (ref >90)
GFR calc non Af Amer: >90 mL/min (ref >90)
Potassium: 2.5 mEq/L — CL (ref 3.5–5.1)

## 2012-09-24 SURGERY — VIDEO BRONCHOSCOPY WITHOUT FLUORO
Anesthesia: Moderate Sedation | Laterality: Bilateral

## 2012-09-24 MED ORDER — LIDOCAINE HCL 1 % IJ SOLN
INTRAMUSCULAR | Status: DC | PRN
  Administered 2012-09-24: 6 mL via RESPIRATORY_TRACT

## 2012-09-24 MED ORDER — PREDNISONE 20 MG PO TABS
40.0000 mg | ORAL_TABLET | Freq: Every day | ORAL | Status: AC
  Administered 2012-09-24: 40 mg via ORAL
  Filled 2012-09-24: qty 2

## 2012-09-24 MED ORDER — SODIUM CHLORIDE 0.9 % IV SOLN
INTRAVENOUS | Status: DC
  Administered 2012-09-24: 08:00:00 via INTRAVENOUS
  Administered 2012-09-24: 10 mL/h via INTRAVENOUS

## 2012-09-24 MED ORDER — PREDNISONE 20 MG PO TABS
20.0000 mg | ORAL_TABLET | Freq: Every day | ORAL | Status: DC
Start: 2012-09-25 — End: 2012-09-26
  Administered 2012-09-25: 20 mg via ORAL
  Filled 2012-09-24 (×3): qty 1

## 2012-09-24 MED ORDER — POTASSIUM CHLORIDE 10 MEQ/100ML IV SOLN: 10.0000 meq | INTRAVENOUS | Status: DC

## 2012-09-24 MED ORDER — LIDOCAINE HCL 2 % EX GEL
CUTANEOUS | Status: DC | PRN
  Administered 2012-09-24: 1

## 2012-09-24 MED ORDER — BISACODYL 10 MG RE SUPP
10.0000 mg | Freq: Every day | RECTAL | Status: DC
  Administered 2012-09-24 – 2012-09-27 (×4): 10 mg via RECTAL
  Filled 2012-09-24 (×4): qty 1

## 2012-09-24 MED ORDER — FENTANYL CITRATE 0.05 MG/ML IJ SOLN
INTRAMUSCULAR | Status: DC | PRN
  Administered 2012-09-24 (×3): 50 ug via INTRAVENOUS

## 2012-09-24 MED ORDER — PHENYLEPHRINE HCL 0.25 % NA SOLN
NASAL | Status: DC | PRN
  Administered 2012-09-24: 1 via NASAL

## 2012-09-24 MED ORDER — POTASSIUM CHLORIDE 10 MEQ/100ML IV SOLN
10.0000 meq | INTRAVENOUS | Status: AC
  Administered 2012-09-24 (×6): 10 meq via INTRAVENOUS
  Filled 2012-09-24 (×6): qty 100

## 2012-09-24 NOTE — Progress Notes (Signed)
Video Bronchoscopy Done  Intervention Bronchial washing Intervention Bronchial brushing  Procedure tolerated well

## 2012-09-24 NOTE — Progress Notes (Signed)
Started patient on chest vest. Placed pt on 8 htz for 10 min. Patient tolerated vest for 3 minutes then wanted RT to cut machine off. Will try again in the am and start on lower HTZ.

## 2012-09-24 NOTE — Progress Notes (Signed)
This note also relates to the following rows which could not be included: Temp - Cannot attach notes to unvalidated device data Pulse Rate - Cannot attach notes to unvalidated device data ECG Heart Rate - Cannot attach notes to unvalidated device data Resp - Cannot attach notes to unvalidated device data BP - Cannot attach notes to unvalidated device data SpO2 - Cannot attach notes to unvalidated device data    09/24/12 0840  Oxygen Therapy  O2 Device Non-rebreather mask  Pulse Oximetry Type Continuous  pt placed on 100% NRB mask due to sats at 84%

## 2012-09-24 NOTE — Progress Notes (Addendum)
PULMONARY  / CRITICAL CARE MEDICINE  Name: Joel Summers MRN: [id number] DOB: Sep 15, 1930    ADMISSION DATE:  09/20/2012 CONSULTATION DATE:  09/22/12  REFERRING MD :  Elisabeth Pigeon PRIMARY SERVICE:  [facility]  CHIEF COMPLAINT:  Eval abn CXR  BRIEF PATIENT DESCRIPTION:  79 y/o WM from nursing home, adm for SOB, cough, and abn CXR w/ extensive right sided changes from hx lung cancer & treatment (no details avail in Epic).  ?Aspiration raised w/ debris seen in right airways on CT Chest.  SIGNIFICANT EVENTS / STUDIES:  8/12 RLL atelectasis  LINES / TUBES:   CULTURES: 8/8 Blood x2>>ng 8/8 Urine>>neg  ANTIBIOTICS: Cefepime 8/8>>  Vancomycin 8/8>>8/11   SUBJECTIVE: diuresed well afebrile   Remains NPO  VITAL SIGNS: Temp:  [97.5 F (36.4 C)-98 F (36.7 C)] 97.5 F (36.4 C) (08/12 0605) Pulse Rate:  [68-88] 77 (08/12 0750) Resp:  [15-20] 20 (08/12 0750) BP: (143-172)/(73-86) 172/86 mmHg (08/12 0750) SpO2:  [95 %-97 %] 96 % (08/12 0750) Weight:  [76.5 kg (168 lb 10.4 oz)] 76.5 kg (168 lb 10.4 oz) (08/12 0605)  PHYSICAL EXAMINATION: Constitutional: Elderly, appears chr ill. No distress.  HENT: Normocephalic. Dry mucus membranes.  Eyes: Conjunctivae and EOM are normal. PERRLA, no scleral icterus.  Neck:  No JVD. No tracheal deviation. No thyromegaly.  CVS: RRR, Gr1/6 SEM at lsb w/o rubs or gallops detected Lungs: decr BS on right, no rhonchi Abdominal: Soft & nontender w/o guarding or rebound Musculoskeletal: DJD- sl decr ROM. No edema and no tenderness.  Lymphadenopathy: No lymphadenopathy noted, cervical, inguinal. Neuro: Alert. Not oriented, no focal neurologic deficits  Skin: Skin is warm and dry.    Recent Labs Lab 09/21/12 0352 09/23/12 0354 09/24/12 0340  NA 136 137 136  K 3.9 3.0* 2.5*  CL 97 96 94*  CO2 32 36* 40*  BUN 13 9 5*  CREATININE 0.59 0.58 0.55  GLUCOSE 183* 240* 196*    Recent Labs Lab 09/21/12 0352 09/23/12 0354 09/24/12 0340  HGB 11.0* 11.7*  11.7*  HCT 31.9* 34.3* 34.6*  WBC 18.4* 15.2* 10.4  PLT 406* 462* 490*   Dg Chest Port 1 View  09/24/2012   *RADIOLOGY REPORT*  Clinical Data: Follow up airspace disease  PORTABLE CHEST - 1 VIEW  Comparison: 09/20/2012  Findings: Right perihilar density unchanged.  Right pleural effusion/pleural scarring unchanged.  There is right lower lobe collapse which has developed in the interval.  This may be due to endobronchial plugging from mucus or aspiration.  Mild left lower lobe infiltrate unchanged.  No effusion on the left.  Underlying COPD.  IMPRESSION: Right lower lobe collapse has occurred in the interval.  There is a small right effusion/pleural scarring.  Mild left lower lobe infiltrate is unchanged.   Original Report Authenticated By: Janeece Riggers, M.D.   Dg Swallowing Func-speech Pathology  09/23/2012   Chales Abrahams, CCC-SLP     09/23/2012  3:21 PM Objective Swallowing Evaluation: Modified Barium Swallowing Study   Patient Details  Name: Joel Summers MRN: [id number] Date of Birth: August 29, 1930  Today's Date: 09/23/2012 Time: 5621-3086 SLP Time Calculation (min): 30 min  Past Medical History:  Past Medical History  Diagnosis Date  . Altered mental status   . COPD (chronic obstructive pulmonary disease)   . Diabetes mellitus without complication   . Hypertension   . Cancer   . Lung cancer    Past Surgical History: History reviewed. No pertinent past  surgical history. HPI:  79  yo resident of ALF [facility])  admitted to [facility]  after falling-pt found to have pna.  PMH + for HTN, COPD,  dementia.  Pt CT head showed hematoma over frontal bone, atrophy.   Pt with possible debris in right lung concerning for aspiration.   Pt denies dysphagia but given his dementia, uncertain of ability  to recall information.  BSE completed with indications for MBS  and order was obtained.       Assessment / Plan / Recommendation Clinical Impression  Dysphagia Diagnosis: Moderate oral phase  dysphagia;Moderate  pharyngeal phase dysphagia (suspect esophageal component as well)   Clinical impression: Pt presents with moderate oropharyngeal  sensorimotor dysphagia with decreased oral bolus containment from  weakness resulting in premature spillage of barium into pharynx  and oral residuals (mostly with liquids) without pt sensation.   Pt's pharyngeal swallow characterized by delayed initiation  (cracker accumulating in vallecular space for 4 seconds prior to  swallow) and liquids spilled to pyriform sinus prior to swallow.   Decreased tongue base retraction, laryngeal elevation, pharyngeal  contraction result in pharyngeal stasis with liquids and barium  tablet WITHOUT awareness and laryngeal penetration of both  nectar/thin consistencies.  Chin tuck not helpful and cued cough  was WEAK and did not clear penetrates.  Barium tablet given with  pudding precariously lodged at vallecular space without pt  awareness, thin aided clearance but with penetration.   Given pt's increased dyspenia with effort, delayed swallow  responses, no awareness to stasis and weak nonprotective cough,  he is deemed a high aspiration risk with po.  Suspect pt is  aspirating secretions as barium mixed with secretions from oral  cavity and were penetrated.  Skilled intervention included  educating pt to findings via video monitor, providing visual and  verbal feedback to compensation strategies.    Options may be to allow po with known risk and strategies to  mitigate risk or keep npo except medicine crushed with applesauce  and ice prn after oral care.   Would need repeat MBS after  medical/respiratory improvement due to sensory motor deficits.   Unfortunately pt coughed throughout entire MBS making clinical  evaluation inaccurate and pt's cough is weak and nonprotective.    SLP phoned ALF after MBS and spoke to [place], resident care  Production designer, theatre/television/film, who reported pt was on a regular/thin diet and took  medications with water with good  tolerance as far as she knew.   Grover Canavan stated pt did not have significant weight loss nor  pulmonary infections recently as far as she knew.         Treatment Recommendation  Therapy as outlined in treatment plan below    Diet Recommendation NPO;Ice chips PRN after oral care (vs po of  pureed/thin with risks accepted)   Medication Administration: Crushed with puree    Other  Recommendations Recommended Consults: MBS Oral Care Recommendations: Oral care BID   Follow Up Recommendations  Skilled Nursing facility    Frequency and Duration min 2x/week  2 weeks   Pertinent Vitals/Pain Afebrile, nonproductive congested cough, pt  unable to clear, occasional wet voice-? Asp of secretions    SLP Swallow Goals Patient will utilize recommended strategies during swallow to  increase swallowing safety with: Total assistance Goal #3: Pt and MD will determine if desire for pt to eat with  known aspiration risk and strategies to mitigate it.     General HPI: 79 yo resident of ALF [facility])  admitted to [facility] after falling-pt found to have pna.  PMH + for  HTN, COPD, dementia.  Pt CT head showed hematoma over frontal  bone, atrophy.  Pt with possible debris in right lung concerning  for aspiration.  Pt denies dysphagia but given his dementia,  uncertain of ability to recall information.  BSE completed with  indications for MBS and order was obtained.   Type of Study: Modified Barium Swallowing Study Reason for Referral: Objectively evaluate swallowing function Previous Swallow Assessment: none in epic Diet Prior to this Study: NPO Temperature Spikes Noted: No Respiratory Status: Supplemental O2 delivered via (comment) History of Recent Intubation: No Behavior/Cognition:  Alert;Cooperative;Confused;Impulsive;Distractible;Decreased  sustained attention Oral Cavity - Dentition: Edentulous Oral Motor / Sensory Function: Impaired - see Bedside swallow  eval Self-Feeding Abilities: Needs assist Patient Positioning:  Upright in chair Baseline Vocal Quality: Low vocal intensity Volitional Cough: Weak;Congested Volitional Swallow: Able to elicit Anatomy: Within functional limits Pharyngeal Secretions: Standing secretions in (comment)    Reason for Referral Objectively evaluate swallowing function   Oral Phase Oral Preparation/Oral Phase Oral Phase: Impaired Oral - Nectar Oral - Nectar Teaspoon: Reduced posterior propulsion;Piecemeal  swallowing;Delayed oral transit;Weak lingual manipulation Oral - Nectar Cup: Reduced posterior propulsion;Piecemeal  swallowing;Delayed oral transit;Weak lingual manipulation Oral - Thin Oral - Thin Teaspoon: Delayed oral transit;Piecemeal  swallowing;Weak lingual manipulation Oral - Thin Cup: Piecemeal swallowing;Reduced posterior  propulsion;Weak lingual manipulation;Lingual/palatal residue Oral - Thin Straw: Piecemeal swallowing;Reduced posterior  propulsion;Weak lingual manipulation;Lingual/palatal residue Oral - Solids Oral - Puree: Reduced posterior propulsion;Delayed oral  transit;Weak lingual manipulation Oral - Regular: Delayed oral transit;Impaired mastication;Reduced  posterior propulsion;Lingual/palatal residue;Weak lingual  manipulation Oral - Pill: Reduced posterior propulsion;Delayed oral  transit;Weak lingual manipulation Oral Phase - Comment Oral Phase - Comment: oral residuals prematurely spill into  pharynx without pt awareness- at times pt required cues to  swallow and response was delayed   Pharyngeal Phase Pharyngeal Phase Pharyngeal Phase: Impaired Pharyngeal - Nectar Pharyngeal - Nectar Teaspoon: Delayed swallow  initiation;Pharyngeal residue - valleculae;Reduced laryngeal  elevation;Reduced airway/laryngeal closure;Reduced tongue base  retraction;Reduced pharyngeal peristalsis Pharyngeal - Nectar Cup: Delayed swallow initiation;Premature  spillage to pyriform sinuses;Reduced airway/laryngeal  closure;Reduced laryngeal elevation;Reduced tongue base  retraction;Pharyngeal  residue - valleculae;Pharyngeal residue -  pyriform sinuses Pharyngeal - Thin Pharyngeal - Thin Teaspoon: Premature spillage to pyriform  sinuses;Reduced airway/laryngeal closure;Reduced laryngeal  elevation;Delayed swallow initiation;Reduced pharyngeal  peristalsis;Pharyngeal residue - valleculae (swallow triggered at  approx 2 seconds at vallecular space) Pharyngeal - Thin Cup: Premature spillage to valleculae;Delayed  swallow initiation;Premature spillage to pyriform  sinuses;Penetration/Aspiration during swallow;Reduced laryngeal  elevation;Reduced airway/laryngeal closure;Reduced tongue base  retraction;Reduced pharyngeal peristalsis;Pharyngeal residue -  valleculae Penetration/Aspiration details (thin cup): Material enters  airway, remains ABOVE vocal cords and not ejected out Pharyngeal - Thin Straw: Delayed swallow initiation;Premature  spillage to pyriform sinuses;Penetration/Aspiration during  swallow;Penetration/Aspiration after swallow;Reduced  airway/laryngeal closure;Reduced laryngeal elevation;Reduced  pharyngeal peristalsis Penetration/Aspiration details (thin straw): Material enters  airway, remains ABOVE vocal cords and not ejected out Pharyngeal - Solids Pharyngeal - Puree: Delayed swallow initiation;Premature spillage  to valleculae (trace base of tongue stasis without awarness) Pharyngeal - Regular: Delayed swallow initiation;Premature  spillage to valleculae (delay up to 4 seconds filling vallecular  space) Pharyngeal - Pill: Delayed swallow initiation;Pharyngeal residue  - valleculae;Premature spillage to valleculae;Reduced tongue base  retraction;Reduced laryngeal elevation;Reduced airway/laryngeal  closure (pt did not sense barium tablet lodged in pharynx) Pharyngeal Phase - Comment Pharyngeal Comment: pt with delayed pharyngeal swallow initiation  and no awareness  to tongue base and vallecular stasis, cued dry  swallows helpful but pt was delayed in performance and would  likely be taxing  for pt with COPD, cued cough did not fully clear  trace penetration, chin tuck did not prevent penetration of thin  was difficult for pt to perform, nectar liquids resulted in more  stasis than thin, pharyngeal swallow strong with solid and puree  without stasis   Cervical Esophageal Phase    GO    Cervical Esophageal Phase Cervical Esophageal Phase: Impaired Cervical Esophageal Phase - Comment Cervical Esophageal Comment: After pt consumed a very small  amount of nectar barium *one cup sip and one tsp, pt appeared  with significant distal stasis without awareness that did not  clear, swallowing water boluses aided clearance; further boluses  of thin resulted in distal stasis - again requiring water bolus  to clear.  Pt appeared to clear puree, cracker and barium tablet  in timely fashion.  Would recommend strict esophageal/reflux  precautions for this pt.         Donavan Burnet, MS [facility] SLP [phone]          ASSESSMENT / PLAN:  1)  COPD - records indicate he was on Proventil 2mg Tid, ProventilHFA prn, Robitussin, Pred20mg  & Augmentin/ Levaquin recently in the NH 2)  Hx Lung Cancer w/ surg & ?XRT- we do not have any records or details & this would obviously be of signif value in his current assessment 3)  Poss aspiration w/ debris in right sided airways - he is not in distress & not coughing at present 4) RLL atelectasis  Plan: - Proceed bronch w/ lavage -consent obtained from POA.  However, given his advanced dementia, not sure that he would be a candidate for chemo / XRT - needs swallowing eval -MBS planned , OK to advance diet based on eval - agree w/ NEBS, chest vest bid  - agree w/ empiric Maxipime, Vanco coverage for now - wean Pred rapidly (looks like it was started in the NH), reduced 8/11 to 40 QD - careful w/ diuretic> watch renal & repelte K K - background medical data, details of his prev lung cancer therapy, etc are very important & efforts being made to obtain this info from [facility]  clinic   Cyril Mourning MD. Tonny Bollman. [place] Pager [phone] If no response call [phone]     Addendum - Bronchoscopy findings & likely aspiration were discussed with HCPOA. Decision made to issue DNR order.   ALVA,RAKESH V.

## 2012-09-24 NOTE — Progress Notes (Signed)
Sm to mod amt of soft, brown stool after dulcolax supp. as ordered. Pt assisted oob to bsc, gen weakness noted

## 2012-09-24 NOTE — Op Note (Signed)
Indication : RLL atelectasis & dense consolidation infiltrates in this ex smoker with dementia & lung cancer Written informed consent was obtained prior to the procedure from [facility]. The risks of the procedure including coughing, bleeding and the small chance of lung puncture requiring chest tube were discussed in great detail. The benefits & alternatives including serial follow up were also discussed.  Allergy to ativan was noted. 125  mcg fentnayl used in divided doses during the procedure Bronchoscope entered from the right nare. Upper airway nml Vocal cords showed nml appearance & motion. Trachea & bronchial tree examined to the subsegmental level. Large amount of thick, white secretions were suctioned from right sided airways. Endobronchial lesion seen blocking the lateral subsegment of RLL. Brushings   were obtained from the RUL under fluoroscopy. BAL was also obtained from the RUL. Due to bleeding from the lesion & hypoxia, endobronchial biopsies were not taken There was minimal  coughing  during the procedure.  He was hypoxic & transiently required high flow oxygen after the procedure  A CXR will be performed to r/o presence of pneumothorax.  ALVA,RAKESH V.  [phone]

## 2012-09-24 NOTE — Progress Notes (Signed)
[facility] PROGRESS NOTE  Joel Summers AVW:[id number] DOB: Oct 12, 1930 DOA: 09/20/2012 PCP: Ron Parker, MD  Brief narrative: 79 year old male with past medical history of hypertension, dementia, COPD, diabetes who presented to [facility] ED from SNF with worsening shortness of breath with productive cough. Patient is unable to provide the details of his medical history due to dementia.  In ED, vital signs are stable with BP 135/94, O2 saturation 97% on room air and T 99.6 F. CXR revealed right upper lung lobe mass consistent with history of lung cancer. No preevios CXR available in EPIC for comparison. No previous records of history of lung cancer or treatments. CBC revealed WBC count of 14.5 and 18.8, hemoglobin of 11.7. BMP revealed potassium of 3.3 which normalized spontaneously to 3.9. CT head and cervical spine showed no acute intracranial findings. We further obtained CT chest for evaluation of right upper lung mass which was significant for possible aspiration in addition to debris within the right-sided endobronchial tree with bibasilar opacities, right-sided pulmonary process which is primarily felt to be treatment/radiation related. No convincing evidence of dominant residual or recurrent primary tumor. PCCM consulted and plan was for bronchoscopy with lavage today. Per swallow evaluation, pt is at high risk of aspiration and is currently on NPO.  Assessment and Plan:   Principal Problem:  *Acute respiratory distress  - not hypoxic, saturation is 99% on 2L nasal canula - likely due to HCAP or aspiration pneumonia/post obstructive penumonia related to lung mass/lung ca  - continue vanco and cefepime per PCCM recommendations - appreciate PCCM consult and recommendation for further evaluation of RUL mass; plan for bronch with lavage today - continue albuterol and Atrovent nebulizers as needed every 2 hours  - continue prednisone 40 mg daily  - oxygen support via nasal canula to keep O2  saturation above 90%  COPD (chronic obstructive pulmonary disease)  - COPD order set in place  - management as above with steroids (PO), nebulizer treatments PRN and antibiotics Aspiration pneumonia  - pneumonia order set in place  - blood and resp culture results - negative to date  - legionella and strep pneumonia - negative  - continue vanco and cefepime  - aspiration precuation Active Problems:  Anemia of chronic disease - stable hemoglobin at 11.7 --> 11.0 --> 11.7  - no indications for transfusion  Hypokalemia  - supplemented today with 6 runs of IV potassium Leukocytosis  - likely due to steroids and pneumonia  - continue to monitor; WBC count trending down, 20.9 -->15.2 --> normal limit Dementia  - continue aricept and namenda  Lung mass  - right upper lung lobe  - CT finding as above significant for possible aspiration in addition to debris within the right-sided endobronchial tree with bibasilar opacities, right-sided pulmonary process which is primarily felt to be treatment/radiation related. No convincing evidence of dominant residual or recurrent primary tumor.  - appreciate PCCM consult and recommendations; plan for bronch and lavage today with brushings DM (diabetes mellitus)  - continue metformin 500 mg daily  HTN (hypertension)  - continue Hctz   Code Status: DNR/DNI Family Communication: no family at the bedside  Disposition Plan: to ALF if 24 hour supervision provided   Manson Passey, MD  [facility]  Pager [phone]   Consultants:  PCCM Other consultants:  Speech and swallow evaluation  Physical therapy Procedures:  Bronchoscopy with lavage done by PCCM 09/24/2012  Antibiotics:  Vanco 09/20/2012 -->  Cefepime 09/20/2012 -->   If 7PM-7AM, please contact  night-coverage [email or web address] Password [email or web address] 09/24/2012, 6:39 AM   LOS: 4 days    HPI/Subjective: No acute overnight events.  Objective: Filed Vitals:   09/23/12 0506 09/23/12 1335 09/23/12 2116  09/24/12 0605  BP: 151/59 149/74 143/73 159/74  Pulse: 73 88 83 70  Temp: 97.6 F (36.4 C) 98 F (36.7 C) 97.5 F (36.4 C) 97.5 F (36.4 C)  TempSrc: Oral Oral Oral Oral  Resp: 24 18 16 20   Height:      Weight: 79.969 kg (176 lb 4.8 oz)   76.5 kg (168 lb 10.4 oz)  SpO2: 92% 97% 95%     Intake/Output Summary (Last 24 hours) at 09/24/12 1610 Last data filed at 09/24/12 0618  Gross per 24 hour  Intake   1050 ml  Output   3825 ml  Net  -2775 ml    Exam:   General:  Pt is alert, disoriented, not in acute distress  Cardiovascular: irregular rhythm, rate controlled, S1/S2 appreciated  Respiratory: Clear to auscultation bilaterally, no wheezing, no crackles, no rhonchi  Abdomen: Soft, non tender, non distended, bowel sounds present, no guarding  Extremities: No edema, pulses DP and PT palpable bilaterally  Neuro: Grossly nonfocal  Data Reviewed: Basic Metabolic Panel:  Recent Labs Lab 09/20/12 1510 09/20/12 2010 09/21/12 0352 09/23/12 0354 09/24/12 0340  NA 137 138 136 137 136  K 3.9 3.6 3.9 3.0* 2.5*  CL 95* 96 97 96 94*  CO2 31 33* 32 36* 40*  GLUCOSE 207* 158* 183* 240* 196*  BUN 18 15 13 9  5*  CREATININE 0.83 0.72 0.59 0.58 0.55  CALCIUM 8.8 8.9 8.9 8.8 8.9  MG  --  1.8  --   --   --   PHOS  --  2.9  --   --   --    Liver Function Tests:  Recent Labs Lab 09/20/12 1510 09/20/12 2010 09/21/12 0352  AST 19 18 21   ALT 19 18 16   ALKPHOS 91 92 93  BILITOT 0.5 0.3 0.3  PROT 6.5 6.6 6.1  ALBUMIN 2.4* 2.4* 2.2*   No results found for this basename: LIPASE, AMYLASE,  in the last 168 hours No results found for this basename: AMMONIA,  in the last 168 hours CBC:  Recent Labs Lab 09/20/12 0410 09/20/12 1510 09/20/12 2010 09/21/12 0352 09/23/12 0354 09/24/12 0340  WBC 14.5* 18.8* 20.9* 18.4* 15.2* 10.4  NEUTROABS 12.2* 15.8* 17.3*  --   --   --   HGB 11.7* 11.4* 11.5* 11.0* 11.7* 11.7*  HCT 33.7* 33.2* 33.7* 31.9* 34.3* 34.6*  MCV 89.2 89.5 90.1  89.9 90.5 90.1  PLT 410* 453* 433* 406* 462* 490*   Cardiac Enzymes: No results found for this basename: CKTOTAL, CKMB, CKMBINDEX, TROPONINI,  in the last 168 hours BNP: No components found with this basename: POCBNP,  CBG:  Recent Labs Lab 09/23/12 1139 09/23/12 1607 09/23/12 2023 09/24/12 0002 09/24/12 0401  GLUCAP 157* 170* 175* 194* 182*    Recent Results (from the past 240 hour(s))  URINE CULTURE     Status: None   Collection Time    09/20/12  4:52 PM      Result Value Range Status   Specimen Description URINE, RANDOM   Final   Special Requests NONE   Final   Culture  Setup Time     Final   Value: 09/20/2012 22:14     Performed at [facility] Count     Final  Value: NO GROWTH     Performed at [facility]   Culture     Final   Value: NO GROWTH     Performed at [facility]   Report Status 09/21/2012 FINAL   Final  CULTURE, BLOOD (ROUTINE X 2)     Status: None   Collection Time    09/20/12  5:49 PM      Result Value Range Status   Specimen Description BLOOD RIGHT ARM   Final   Special Requests BOTTLES DRAWN AEROBIC AND ANAEROBIC   Final   Culture  Setup Time     Final   Value: 09/21/2012 03:01     Performed at [facility]   Culture     Final   Value:        BLOOD CULTURE RECEIVED NO GROWTH TO DATE CULTURE WILL BE HELD FOR 5 DAYS BEFORE ISSUING A FINAL NEGATIVE REPORT     Performed at [facility]   Report Status PENDING   Incomplete  CULTURE, BLOOD (ROUTINE X 2)     Status: None   Collection Time    09/20/12  5:58 PM      Result Value Range Status   Specimen Description BLOOD RIGHT HAND   Final   Special Requests BOTTLES DRAWN AEROBIC AND ANAEROBIC   Final   Culture  Setup Time     Final   Value: 09/21/2012 03:01     Performed at [facility]   Culture     Final   Value:        BLOOD CULTURE RECEIVED NO GROWTH TO DATE CULTURE WILL BE HELD FOR 5 DAYS BEFORE ISSUING A FINAL NEGATIVE REPORT      Performed at [facility]   Report Status PENDING   Incomplete  MRSA PCR SCREENING     Status: None   Collection Time    09/20/12 11:02 PM      Result Value Range Status   MRSA by PCR NEGATIVE  NEGATIVE Final   Comment:            The GeneXpert MRSA Assay (FDA     approved for NASAL specimens     only), is one component of a     comprehensive MRSA colonization     surveillance program. It is not     intended to diagnose MRSA     infection nor to guide or     monitor treatment for     MRSA infections.     Studies: Dg Swallowing Func-speech Pathology  09/23/2012   Chales Abrahams, CCC-SLP     09/23/2012  3:21 PM Objective Swallowing Evaluation: Modified Barium Swallowing Study   Patient Details  Name: Joel Summers MRN: [id number] Date of Birth: 09-Oct-1930  Today's Date: 09/23/2012 Time: 4098-1191 SLP Time Calculation (min): 30 min  Past Medical History:  Past Medical History  Diagnosis Date  . Altered mental status   . COPD (chronic obstructive pulmonary disease)   . Diabetes mellitus without complication   . Hypertension   . Cancer   . Lung cancer    Past Surgical History: History reviewed. No pertinent past  surgical history. HPI:  79 yo resident of ALF [facility])  admitted to [facility]  after falling-pt found to have pna.  PMH + for HTN, COPD,  dementia.  Pt CT head showed hematoma over frontal bone, atrophy.   Pt with possible debris in right lung  concerning for aspiration.   Pt denies dysphagia but given his dementia, uncertain of ability  to recall information.  BSE completed with indications for MBS  and order was obtained.       Assessment / Plan / Recommendation Clinical Impression  Dysphagia Diagnosis: Moderate oral phase dysphagia;Moderate  pharyngeal phase dysphagia (suspect esophageal component as well)   Clinical impression: Pt presents with moderate oropharyngeal  sensorimotor dysphagia with decreased oral bolus containment from  weakness resulting in  premature spillage of barium into pharynx  and oral residuals (mostly with liquids) without pt sensation.   Pt's pharyngeal swallow characterized by delayed initiation  (cracker accumulating in vallecular space for 4 seconds prior to  swallow) and liquids spilled to pyriform sinus prior to swallow.   Decreased tongue base retraction, laryngeal elevation, pharyngeal  contraction result in pharyngeal stasis with liquids and barium  tablet WITHOUT awareness and laryngeal penetration of both  nectar/thin consistencies.  Chin tuck not helpful and cued cough  was WEAK and did not clear penetrates.  Barium tablet given with  pudding precariously lodged at vallecular space without pt  awareness, thin aided clearance but with penetration.   Given pt's increased dyspenia with effort, delayed swallow  responses, no awareness to stasis and weak nonprotective cough,  he is deemed a high aspiration risk with po.  Suspect pt is  aspirating secretions as barium mixed with secretions from oral  cavity and were penetrated.  Skilled intervention included  educating pt to findings via video monitor, providing visual and  verbal feedback to compensation strategies.    Options may be to allow po with known risk and strategies to  mitigate risk or keep npo except medicine crushed with applesauce  and ice prn after oral care.   Would need repeat MBS after  medical/respiratory improvement due to sensory motor deficits.   Unfortunately pt coughed throughout entire MBS making clinical  evaluation inaccurate and pt's cough is weak and nonprotective.    SLP phoned ALF after MBS and spoke to Jane Lew, resident care  Production designer, theatre/television/film, who reported pt was on a regular/thin diet and took  medications with water with good tolerance as far as she knew.   Grover Canavan stated pt did not have significant weight loss nor  pulmonary infections recently as far as she knew.         Treatment Recommendation  Therapy as outlined in treatment plan below    Diet  Recommendation NPO;Ice chips PRN after oral care (vs po of  pureed/thin with risks accepted)   Medication Administration: Crushed with puree    Other  Recommendations Recommended Consults: MBS Oral Care Recommendations: Oral care BID   Follow Up Recommendations  Skilled Nursing facility    Frequency and Duration min 2x/week  2 weeks   Pertinent Vitals/Pain Afebrile, nonproductive congested cough, pt  unable to clear, occasional wet voice-? Asp of secretions    SLP Swallow Goals Patient will utilize recommended strategies during swallow to  increase swallowing safety with: Total assistance Goal #3: Pt and MD will determine if desire for pt to eat with  known aspiration risk and strategies to mitigate it.     General HPI: 79 yo resident of ALF [facility])   admitted to [facility] after falling-pt found to have pna.  PMH + for  HTN, COPD, dementia.  Pt CT head showed hematoma over frontal  bone, atrophy.  Pt with possible debris in right lung concerning  for aspiration.  Pt denies dysphagia but  given his dementia,  uncertain of ability to recall information.  BSE completed with  indications for MBS and order was obtained.   Type of Study: Modified Barium Swallowing Study Reason for Referral: Objectively evaluate swallowing function Previous Swallow Assessment: none in epic Diet Prior to this Study: NPO Temperature Spikes Noted: No Respiratory Status: Supplemental O2 delivered via (comment) History of Recent Intubation: No Behavior/Cognition:  Alert;Cooperative;Confused;Impulsive;Distractible;Decreased  sustained attention Oral Cavity - Dentition: Edentulous Oral Motor / Sensory Function: Impaired - see Bedside swallow  eval Self-Feeding Abilities: Needs assist Patient Positioning: Upright in chair Baseline Vocal Quality: Low vocal intensity Volitional Cough: Weak;Congested Volitional Swallow: Able to elicit Anatomy: Within functional limits Pharyngeal Secretions: Standing secretions in (comment)    Reason for  Referral Objectively evaluate swallowing function   Oral Phase Oral Preparation/Oral Phase Oral Phase: Impaired Oral - Nectar Oral - Nectar Teaspoon: Reduced posterior propulsion;Piecemeal  swallowing;Delayed oral transit;Weak lingual manipulation Oral - Nectar Cup: Reduced posterior propulsion;Piecemeal  swallowing;Delayed oral transit;Weak lingual manipulation Oral - Thin Oral - Thin Teaspoon: Delayed oral transit;Piecemeal  swallowing;Weak lingual manipulation Oral - Thin Cup: Piecemeal swallowing;Reduced posterior  propulsion;Weak lingual manipulation;Lingual/palatal residue Oral - Thin Straw: Piecemeal swallowing;Reduced posterior  propulsion;Weak lingual manipulation;Lingual/palatal residue Oral - Solids Oral - Puree: Reduced posterior propulsion;Delayed oral  transit;Weak lingual manipulation Oral - Regular: Delayed oral transit;Impaired mastication;Reduced  posterior propulsion;Lingual/palatal residue;Weak lingual  manipulation Oral - Pill: Reduced posterior propulsion;Delayed oral  transit;Weak lingual manipulation Oral Phase - Comment Oral Phase - Comment: oral residuals prematurely spill into  pharynx without pt awareness- at times pt required cues to  swallow and response was delayed   Pharyngeal Phase Pharyngeal Phase Pharyngeal Phase: Impaired Pharyngeal - Nectar Pharyngeal - Nectar Teaspoon: Delayed swallow  initiation;Pharyngeal residue - valleculae;Reduced laryngeal  elevation;Reduced airway/laryngeal closure;Reduced tongue base  retraction;Reduced pharyngeal peristalsis Pharyngeal - Nectar Cup: Delayed swallow initiation;Premature  spillage to pyriform sinuses;Reduced airway/laryngeal  closure;Reduced laryngeal elevation;Reduced tongue base  retraction;Pharyngeal residue - valleculae;Pharyngeal residue -  pyriform sinuses Pharyngeal - Thin Pharyngeal - Thin Teaspoon: Premature spillage to pyriform  sinuses;Reduced airway/laryngeal closure;Reduced laryngeal  elevation;Delayed swallow  initiation;Reduced pharyngeal  peristalsis;Pharyngeal residue - valleculae (swallow triggered at  approx 2 seconds at vallecular space) Pharyngeal - Thin Cup: Premature spillage to valleculae;Delayed  swallow initiation;Premature spillage to pyriform  sinuses;Penetration/Aspiration during swallow;Reduced laryngeal  elevation;Reduced airway/laryngeal closure;Reduced tongue base  retraction;Reduced pharyngeal peristalsis;Pharyngeal residue -  valleculae Penetration/Aspiration details (thin cup): Material enters  airway, remains ABOVE vocal cords and not ejected out Pharyngeal - Thin Straw: Delayed swallow initiation;Premature  spillage to pyriform sinuses;Penetration/Aspiration during  swallow;Penetration/Aspiration after swallow;Reduced  airway/laryngeal closure;Reduced laryngeal elevation;Reduced  pharyngeal peristalsis Penetration/Aspiration details (thin straw): Material enters  airway, remains ABOVE vocal cords and not ejected out Pharyngeal - Solids Pharyngeal - Puree: Delayed swallow initiation;Premature spillage  to valleculae (trace base of tongue stasis without awarness) Pharyngeal - Regular: Delayed swallow initiation;Premature  spillage to valleculae (delay up to 4 seconds filling vallecular  space) Pharyngeal - Pill: Delayed swallow initiation;Pharyngeal residue  - valleculae;Premature spillage to valleculae;Reduced tongue base  retraction;Reduced laryngeal elevation;Reduced airway/laryngeal  closure (pt did not sense barium tablet lodged in pharynx) Pharyngeal Phase - Comment Pharyngeal Comment: pt with delayed pharyngeal swallow initiation  and no awareness to tongue base and vallecular stasis, cued dry  swallows helpful but pt was delayed in performance and would  likely be taxing for pt with COPD, cued cough did not fully clear  trace penetration, chin tuck did not prevent penetration of thin  was difficult for pt to perform, nectar liquids resulted in more  stasis than thin, pharyngeal swallow strong  with solid and puree  without stasis   Cervical Esophageal Phase    GO    Cervical Esophageal Phase Cervical Esophageal Phase: Impaired Cervical Esophageal Phase - Comment Cervical Esophageal Comment: After pt consumed a very small  amount of nectar barium *one cup sip and one tsp, pt appeared  with significant distal stasis without awareness that did not  clear, swallowing water boluses aided clearance; further boluses  of thin resulted in distal stasis - again requiring water bolus  to clear.  Pt appeared to clear puree, cracker and barium tablet  in timely fashion.  Would recommend strict esophageal/reflux  precautions for this pt.         Donavan Burnet, MS [facility] SLP [phone]     Scheduled Meds: . acidophilus  1 capsule Oral Daily  . aspirin  81 mg Oral Daily  . ceFEPime (MAXIPIME) IV  1 g Intravenous Q8H  . citalopram  20 mg Oral Daily  . divalproex  500 mg Oral BID  . donepezil  10 mg Oral QHS  . felodipine  5 mg Oral Daily  . gabapentin  100 mg Oral TID  . haloperidol  0.5 mg Oral BID  . hydrochlorothiazide  25 mg Oral Daily  . memantine  5 mg Oral BID  . metFORMIN  500 mg Oral Q breakfast  . potassium chloride  10 mEq Intravenous Q1 Hr x 6  . predniSONE  40 mg Oral Q breakfast  . vancomycin  1,000 mg Intravenous Q12H   Continuous Infusions: . dextrose 5 % and 0.9% NaCl 100 mL/hr at 09/23/12 2536

## 2012-09-24 NOTE — Progress Notes (Signed)
CRITICAL VALUE ALERT  Critical value received:  Potassium 2.5, co2 40  Date of notification:  09/24/2012  Time of notification:  0445  Critical value read back:yes  Nurse who received alert:  Velvet Bathe  MD notified (1st page):  Donnamarie Poag, md  Time of first page:  [phone]  MD notified (2nd page):  Time of second page:  Responding MD:  Donnamarie Poag, md  Time MD responded:  [phone]

## 2012-09-24 NOTE — Progress Notes (Signed)
CSW continuing to follow for d/c planning.  Pt is from [facility] ALF memory care unit.   Pt discharge plan will be dependent on pt nutritional status and awaiting further clarification on this.   CSW to continue to follow to assist with discharge planning.  Jacklynn Lewis, MSW, LCSWA  Clinical Social Work [phone]

## 2012-09-25 ENCOUNTER — Encounter (HOSPITAL_COMMUNITY): Payer: Self-pay | Admitting: Pulmonary Disease

## 2012-09-25 DIAGNOSIS — E43 Unspecified severe protein-calorie malnutrition: Secondary | ICD-10-CM | POA: Diagnosis present

## 2012-09-25 DIAGNOSIS — J9601 Acute respiratory failure with hypoxia: Secondary | ICD-10-CM | POA: Diagnosis present

## 2012-09-25 DIAGNOSIS — E1140 Type 2 diabetes mellitus with diabetic neuropathy, unspecified: Secondary | ICD-10-CM | POA: Diagnosis present

## 2012-09-25 LAB — GLUCOSE, CAPILLARY
Glucose-Capillary: 146 mg/dL — ABNORMAL HIGH (ref 70–99)
Glucose-Capillary: 122 mg/dL — ABNORMAL HIGH (ref 70–99)
Glucose-Capillary: 132 mg/dL — ABNORMAL HIGH (ref 70–99)
Glucose-Capillary: 170 mg/dL — ABNORMAL HIGH (ref 70–99)

## 2012-09-25 LAB — CBC
HCT: 32 % — ABNORMAL LOW (ref 39.0–52.0)
Hemoglobin: 11 g/dL — ABNORMAL LOW (ref 13.0–17.0)
WBC: 9.5 10*3/uL (ref 4.0–10.5)

## 2012-09-25 LAB — BASIC METABOLIC PANEL
CO2: 41 mEq/L (ref 19–32)
Chloride: 92 mEq/L — ABNORMAL LOW (ref 96–112)
Creatinine, Ser: 0.6 mg/dL (ref 0.50–1.35)
Glucose, Bld: 172 mg/dL — ABNORMAL HIGH (ref 70–99)
Sodium: 136 mEq/L (ref 135–145)

## 2012-09-25 LAB — URINE CULTURE: Colony Count: NO GROWTH

## 2012-09-25 MED ORDER — POTASSIUM CHLORIDE 10 MEQ/100ML IV SOLN
10.0000 meq | INTRAVENOUS | Status: AC
  Administered 2012-09-25 (×3): 10 meq via INTRAVENOUS
  Filled 2012-09-25 (×3): qty 100

## 2012-09-25 NOTE — Progress Notes (Signed)
[facility] PROGRESS NOTE  Joel Summers JXB:[id number] DOB: 11-Nov-1930 DOA: 09/20/2012 PCP: Ron Parker, MD  Brief narrative: 79 year old male with past medical history of hypertension, dementia, COPD, diabetes who presented to [facility] ED from SNF with worsening shortness of breath with productive cough. Patient is unable to provide the details of his medical history due to dementia.  In ED, vital signs are stable with BP 135/94, O2 saturation 97% on room air and T 99.6 F. CXR revealed right upper lung lobe mass consistent with history of lung cancer. No preevios CXR available in EPIC for comparison. No previous records of history of lung cancer or treatments. CBC revealed WBC count of 14.5 and 18.8, hemoglobin of 11.7. BMP revealed potassium of 3.3 which normalized spontaneously to 3.9. CT head and cervical spine showed no acute intracranial findings. We further obtained CT chest for evaluation of right upper lung mass which was significant for possible aspiration in addition to debris within the right-sided endobronchial tree with bibasilar opacities, right-sided pulmonary process which is primarily felt to be treatment/radiation related. No convincing evidence of dominant residual or recurrent primary tumor. PCCM consulted and plan was for bronchoscopy with lavage today. Per swallow evaluation, pt is at high risk of aspiration and is currently on NPO.   Assessment and Plan:   Principal Problem:  *Acute respiratory failure with hypoxia - now on nasal canula, on room air oxygen saturation is 91% - likely due to HCAP or aspiration pneumonia/post obstructive penumonia related to lung mass/lung ca  - continue vanco and cefepime per PCCM recommendations  - appreciate PCCM consult and recommendation for further evaluation of RUL mass; patient had bronch with lavage 09/24/2012 with no subsequent complications; results from brushings are still pending - continue albuterol and Atrovent nebulizers as  needed every 2 hours  - continue prednisone 20 mg daily (taper quickly per PCCM) - oxygen support via nasal canula to keep O2 saturation above 90%  COPD (chronic obstructive pulmonary disease)  - COPD order set in place; COPD gold alert ordered - management as above with steroids (PO), nebulizer treatments PRN and antibiotics  Aspiration pneumonia  - pneumonia order set in place  - blood and resp culture results - negative to date  - legionella and strep pneumonia - negative  - continue vanco and cefepime  - aspiration precuation  Active Problems:  Anemia of chronic disease  - stable hemoglobin ranges 11.0 --> 11.7  - no indications for transfusion  Hypokalemia  - supplemented with 6 runs of IV potassium 09/24/2012 and 3 runs of potassium 09/25/2012 Leukocytosis  - likely due to steroids and pneumonia  - continue to monitor; WBC count trending down, 20.9 -->15.2 --> normal limit  Dementia with behavioral disturbances - continue aricept and namenda  - continue haldol 0.5 mg PO BID Lung mass  - right upper lung lobe  - CT finding as above significant for possible aspiration in addition to debris within the right-sided endobronchial tree with bibasilar opacities, right-sided pulmonary process which is primarily felt to be treatment/radiation related. No convincing evidence of dominant residual or recurrent primary tumor.  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 - continue metformin 500 mg daily  Diabetic neuropathy - continue gabapentin 100 mg PO TID HTN (hypertension)  - continue Hctz and felodipine Severe protein calorie malnutrition - NPO due to risk of aspiration - swallow evaluation completed and determined to be at high  risk of aspiration - placed order for peg tube per IR  Code Status: DNR/DNI  Family Communication: no family at the bedside; spoke with patient's daughter  extensively about goals of care; pt is now DNR but patient's daughter needs some time "to absorb this whole situation" with her father as what she remembers from last time seeing her he was doing quite fine. She also told me had history of right side lung cancer and partially removed lung on that side but she cant recall if he ever received the treatment. She wanted peg tube to be inserted for nutritional purposes and has agreed to official goals of care/ palliative care consult.  Disposition Plan: to SNF when stable   Manson Passey, MD  [facility]  Pager [phone]   Consultants:  PCCM Other consultants:  Speech and swallow evaluation  Physical therapy Procedures:  Bronchoscopy with lavage done by PCCM 09/24/2012  Antibiotics:  Vanco 09/20/2012 -->  Cefepime 09/20/2012 -->  If 7PM-7AM, please contact night-coverage [email or web address] Password [facility] 09/25/2012, 6:41 AM   LOS: 5 days    HPI/Subjective: Confused, trying to get up from the bed.  Objective: Filed Vitals:   09/24/12 1400 09/24/12 2129 09/24/12 2215 09/25/12 0438  BP: 138/62 136/58  137/66  Pulse: 81 82 90 69  Temp: 98.6 F (37 C) 98.5 F (36.9 C)  98 F (36.7 C)  TempSrc: Oral Oral  Oral  Resp: 18 22  20   Height:      Weight:    75.5 kg (166 lb 7.2 oz)  SpO2: 94% 97%  95%    Intake/Output Summary (Last 24 hours) at 09/25/12 0641 Last data filed at 09/25/12 0433  Gross per 24 hour  Intake 1106.17 ml  Output    601 ml  Net 505.17 ml    Exam:   General:  Pt is alert, not in acute distress  Cardiovascular: Regular rate and rhythm, S1/S2, no murmurs, no rubs, no gallops  Respiratory: coarse breath sounds bilaterally, deep inspiratory effort produced coughing spasm  Abdomen: Soft, non tender, non distended, bowel sounds present, no guarding  Extremities: trace pitting LE edema, pulses DP and PT palpable bilaterally  Neuro: Grossly nonfocal  Data Reviewed: Basic Metabolic Panel:  Recent Labs Lab 09/20/12 1510  09/20/12 2010 09/21/12 0352 09/23/12 0354 09/24/12 0340 09/25/12 0325  NA 137 138 136 137 136 136  K 3.9 3.6 3.9 3.0* 2.5* 3.1*  CL 95* 96 97 96 94* 92*  CO2 31 33* 32 36* 40* 41*  GLUCOSE 207* 158* 183* 240* 196* 172*  BUN 18 15 13 9  5* 9  CREATININE 0.83 0.72 0.59 0.58 0.55 0.60  CALCIUM 8.8 8.9 8.9 8.8 8.9 8.6  MG  --  1.8  --   --   --   --   PHOS  --  2.9  --   --   --   --    Liver Function Tests:  Recent Labs Lab 09/20/12 1510 09/20/12 2010 09/21/12 0352  AST 19 18 21   ALT 19 18 16   ALKPHOS 91 92 93  BILITOT 0.5 0.3 0.3  PROT 6.5 6.6 6.1  ALBUMIN 2.4* 2.4* 2.2*   No results found for this basename: LIPASE, AMYLASE,  in the last 168 hours No results found for this basename: AMMONIA,  in the last 168 hours CBC:  Recent Labs Lab 09/20/12 0410 09/20/12 1510 09/20/12 2010 09/21/12 0352 09/23/12 0354 09/24/12 0340 09/25/12 0325  WBC 14.5* 18.8* 20.9* 18.4* 15.2* 10.4 9.5  NEUTROABS 12.2* 15.8* 17.3*  --   --   --   --   HGB 11.7* 11.4* 11.5* 11.0* 11.7* 11.7* 11.0*  HCT 33.7* 33.2* 33.7* 31.9* 34.3* 34.6* 32.0*  MCV 89.2 89.5 90.1 89.9 90.5 90.1 89.9  PLT 410* 453* 433* 406* 462* 490* 454*   Cardiac Enzymes: No results found for this basename: CKTOTAL, CKMB, CKMBINDEX, TROPONINI,  in the last 168 hours BNP: No components found with this basename: POCBNP,  CBG:  Recent Labs Lab 09/24/12 1205 09/24/12 1603 09/24/12 1947 09/24/12 2354 09/25/12 0416  GLUCAP 176* 182* 191* 203* 146*    Recent Results (from the past 240 hour(s))  URINE CULTURE     Status: None   Collection Time    09/20/12  4:52 PM      Result Value Range Status   Specimen Description URINE, RANDOM   Final   Special Requests NONE   Final   Culture  Setup Time     Final   Value: 09/20/2012 22:14     Performed at [facility] Count     Final   Value: NO GROWTH     Performed at [facility]   Culture     Final   Value: NO GROWTH     Performed at  [facility]   Report Status 09/21/2012 FINAL   Final  CULTURE, BLOOD (ROUTINE X 2)     Status: None   Collection Time    09/20/12  5:49 PM      Result Value Range Status   Specimen Description BLOOD RIGHT ARM   Final   Special Requests BOTTLES DRAWN AEROBIC AND ANAEROBIC   Final   Culture  Setup Time     Final   Value: 09/21/2012 03:01     Performed at [facility]   Culture     Final   Value:        BLOOD CULTURE RECEIVED NO GROWTH TO DATE CULTURE WILL BE HELD FOR 5 DAYS BEFORE ISSUING A FINAL NEGATIVE REPORT     Performed at [facility]   Report Status PENDING   Incomplete  CULTURE, BLOOD (ROUTINE X 2)     Status: None   Collection Time    09/20/12  5:58 PM      Result Value Range Status   Specimen Description BLOOD RIGHT HAND   Final   Special Requests BOTTLES DRAWN AEROBIC AND ANAEROBIC   Final   Culture  Setup Time     Final   Value: 09/21/2012 03:01     Performed at [facility]   Culture     Final   Value:        BLOOD CULTURE RECEIVED NO GROWTH TO DATE CULTURE WILL BE HELD FOR 5 DAYS BEFORE ISSUING A FINAL NEGATIVE REPORT     Performed at [facility]   Report Status PENDING   Incomplete  MRSA PCR SCREENING     Status: None   Collection Time    09/20/12 11:02 PM      Result Value Range Status   MRSA by PCR NEGATIVE  NEGATIVE Final   Comment:            The GeneXpert MRSA Assay (FDA     approved for NASAL specimens     only), is one component of a     comprehensive MRSA colonization     surveillance program. It is not  intended to diagnose MRSA     infection nor to guide or     monitor treatment for     MRSA infections.  CULTURE, BAL-QUANTITATIVE     Status: None   Collection Time    09/24/12  8:35 AM      Result Value Range Status   Specimen Description BRONCHIAL ALVEOLAR LAVAGE   Final   Special Requests NONE   Final   Gram Stain     Final   Value: ABUNDANT WBC PRESENT,BOTH PMN AND MONONUCLEAR     NO  SQUAMOUS EPITHELIAL CELLS SEEN     NO ORGANISMS SEEN     Performed at [facility] Count PENDING   Incomplete   Culture PENDING   Incomplete   Report Status PENDING   Incomplete     Studies: Dg Chest Port 1 View  09/24/2012   *RADIOLOGY REPORT*  Clinical Data: Post bronchoscopy  PORTABLE CHEST - 1 VIEW  Comparison: Study obtained earlier in the day.  Findings:  No pneumothorax.  There is scarring in the right mid lung with retraction and volume loss.  There is apical pleural thickening on the right.  There is scarring in the left base as well.  There is no new opacity.  Heart size and pulmonary vascularity are within normal limits given the scarring and retraction on the right.  There is underlying emphysema.  IMPRESSION: No appreciable change compared to study obtained earlier in the day.  No pneumothorax.   Original Report Authenticated By: Bretta Bang, M.D.   Dg Chest Port 1 View  09/24/2012   *RADIOLOGY REPORT*  Clinical Data: Follow up airspace disease  PORTABLE CHEST - 1 VIEW  Comparison: 09/20/2012  Findings: Right perihilar density unchanged.  Right pleural effusion/pleural scarring unchanged.  There is right lower lobe collapse which has developed in the interval.  This may be due to endobronchial plugging from mucus or aspiration.  Mild left lower lobe infiltrate unchanged.  No effusion on the left.  Underlying COPD.  IMPRESSION: Right lower lobe collapse has occurred in the interval.  There is a small right effusion/pleural scarring.  Mild left lower lobe infiltrate is unchanged.   Original Report Authenticated By: Janeece Riggers, M.D.   Dg Swallowing Func-speech Pathology  09/23/2012   Chales Abrahams, CCC-SLP     09/23/2012  3:21 PM Objective Swallowing Evaluation: Modified Barium Swallowing Study   Patient Details  Name: Joel Summers MRN: [id number] Date of Birth: 09/13/1930  Today's Date: 09/23/2012 Time: 4098-1191 SLP Time Calculation (min): 30 min  Past Medical  History:  Past Medical History  Diagnosis Date  . Altered mental status   . COPD (chronic obstructive pulmonary disease)   . Diabetes mellitus without complication   . Hypertension   . Cancer   . Lung cancer    Past Surgical History: History reviewed. No pertinent past  surgical history. HPI:  79 yo resident of ALF [facility])  admitted to [facility]  after falling-pt found to have pna.  PMH + for HTN, COPD,  dementia.  Pt CT head showed hematoma over frontal bone, atrophy.   Pt with possible debris in right lung concerning for aspiration.   Pt denies dysphagia but given his dementia, uncertain of ability  to recall information.  BSE completed with indications for MBS  and order was obtained.       Assessment / Plan / Recommendation Clinical Impression  Dysphagia Diagnosis: Moderate oral phase dysphagia;Moderate  pharyngeal phase dysphagia (suspect esophageal component as well)   Clinical impression: Pt presents with moderate oropharyngeal  sensorimotor dysphagia with decreased oral bolus containment from  weakness resulting in premature spillage of barium into pharynx  and oral residuals (mostly with liquids) without pt sensation.   Pt's pharyngeal swallow characterized by delayed initiation  (cracker accumulating in vallecular space for 4 seconds prior to  swallow) and liquids spilled to pyriform sinus prior to swallow.   Decreased tongue base retraction, laryngeal elevation, pharyngeal  contraction result in pharyngeal stasis with liquids and barium  tablet WITHOUT awareness and laryngeal penetration of both  nectar/thin consistencies.  Chin tuck not helpful and cued cough  was WEAK and did not clear penetrates.  Barium tablet given with  pudding precariously lodged at vallecular space without pt  awareness, thin aided clearance but with penetration.   Given pt's increased dyspenia with effort, delayed swallow  responses, no awareness to stasis and weak nonprotective cough,  he is deemed a high aspiration  risk with po.  Suspect pt is  aspirating secretions as barium mixed with secretions from oral  cavity and were penetrated.  Skilled intervention included  educating pt to findings via video monitor, providing visual and  verbal feedback to compensation strategies.    Options may be to allow po with known risk and strategies to  mitigate risk or keep npo except medicine crushed with applesauce  and ice prn after oral care.   Would need repeat MBS after  medical/respiratory improvement due to sensory motor deficits.   Unfortunately pt coughed throughout entire MBS making clinical  evaluation inaccurate and pt's cough is weak and nonprotective.    SLP phoned ALF after MBS and spoke to [place], resident care  Production designer, theatre/television/film, who reported pt was on a regular/thin diet and took  medications with water with good tolerance as far as she knew.   Grover Canavan stated pt did not have significant weight loss nor  pulmonary infections recently as far as she knew.         Treatment Recommendation  Therapy as outlined in treatment plan below    Diet Recommendation NPO;Ice chips PRN after oral care (vs po of  pureed/thin with risks accepted)   Medication Administration: Crushed with puree    Other  Recommendations Recommended Consults: MBS Oral Care Recommendations: Oral care BID   Follow Up Recommendations  Skilled Nursing facility    Frequency and Duration min 2x/week  2 weeks   Pertinent Vitals/Pain Afebrile, nonproductive congested cough, pt  unable to clear, occasional wet voice-? Asp of secretions    SLP Swallow Goals Patient will utilize recommended strategies during swallow to  increase swallowing safety with: Total assistance Goal #3: Pt and MD will determine if desire for pt to eat with  known aspiration risk and strategies to mitigate it.     General HPI: 79 yo resident of ALF [facility])   admitted to [facility] after falling-pt found to have pna.  PMH + for  HTN, COPD, dementia.  Pt CT head showed hematoma over frontal   bone, atrophy.  Pt with possible debris in right lung concerning  for aspiration.  Pt denies dysphagia but given his dementia,  uncertain of ability to recall information.  BSE completed with  indications for MBS and order was obtained.   Type of Study: Modified Barium Swallowing Study Reason for Referral: Objectively evaluate swallowing function Previous Swallow Assessment: none in epic Diet Prior to this Study: NPO Temperature Spikes  Noted: No Respiratory Status: Supplemental O2 delivered via (comment) History of Recent Intubation: No Behavior/Cognition:  Alert;Cooperative;Confused;Impulsive;Distractible;Decreased  sustained attention Oral Cavity - Dentition: Edentulous Oral Motor / Sensory Function: Impaired - see Bedside swallow  eval Self-Feeding Abilities: Needs assist Patient Positioning: Upright in chair Baseline Vocal Quality: Low vocal intensity Volitional Cough: Weak;Congested Volitional Swallow: Able to elicit Anatomy: Within functional limits Pharyngeal Secretions: Standing secretions in (comment)    Reason for Referral Objectively evaluate swallowing function   Oral Phase Oral Preparation/Oral Phase Oral Phase: Impaired Oral - Nectar Oral - Nectar Teaspoon: Reduced posterior propulsion;Piecemeal  swallowing;Delayed oral transit;Weak lingual manipulation Oral - Nectar Cup: Reduced posterior propulsion;Piecemeal  swallowing;Delayed oral transit;Weak lingual manipulation Oral - Thin Oral - Thin Teaspoon: Delayed oral transit;Piecemeal  swallowing;Weak lingual manipulation Oral - Thin Cup: Piecemeal swallowing;Reduced posterior  propulsion;Weak lingual manipulation;Lingual/palatal residue Oral - Thin Straw: Piecemeal swallowing;Reduced posterior  propulsion;Weak lingual manipulation;Lingual/palatal residue Oral - Solids Oral - Puree: Reduced posterior propulsion;Delayed oral  transit;Weak lingual manipulation Oral - Regular: Delayed oral transit;Impaired mastication;Reduced  posterior  propulsion;Lingual/palatal residue;Weak lingual  manipulation Oral - Pill: Reduced posterior propulsion;Delayed oral  transit;Weak lingual manipulation Oral Phase - Comment Oral Phase - Comment: oral residuals prematurely spill into  pharynx without pt awareness- at times pt required cues to  swallow and response was delayed   Pharyngeal Phase Pharyngeal Phase Pharyngeal Phase: Impaired Pharyngeal - Nectar Pharyngeal - Nectar Teaspoon: Delayed swallow  initiation;Pharyngeal residue - valleculae;Reduced laryngeal  elevation;Reduced airway/laryngeal closure;Reduced tongue base  retraction;Reduced pharyngeal peristalsis Pharyngeal - Nectar Cup: Delayed swallow initiation;Premature  spillage to pyriform sinuses;Reduced airway/laryngeal  closure;Reduced laryngeal elevation;Reduced tongue base  retraction;Pharyngeal residue - valleculae;Pharyngeal residue -  pyriform sinuses Pharyngeal - Thin Pharyngeal - Thin Teaspoon: Premature spillage to pyriform  sinuses;Reduced airway/laryngeal closure;Reduced laryngeal  elevation;Delayed swallow initiation;Reduced pharyngeal  peristalsis;Pharyngeal residue - valleculae (swallow triggered at  approx 2 seconds at vallecular space) Pharyngeal - Thin Cup: Premature spillage to valleculae;Delayed  swallow initiation;Premature spillage to pyriform  sinuses;Penetration/Aspiration during swallow;Reduced laryngeal  elevation;Reduced airway/laryngeal closure;Reduced tongue base  retraction;Reduced pharyngeal peristalsis;Pharyngeal residue -  valleculae Penetration/Aspiration details (thin cup): Material enters  airway, remains ABOVE vocal cords and not ejected out Pharyngeal - Thin Straw: Delayed swallow initiation;Premature  spillage to pyriform sinuses;Penetration/Aspiration during  swallow;Penetration/Aspiration after swallow;Reduced  airway/laryngeal closure;Reduced laryngeal elevation;Reduced  pharyngeal peristalsis Penetration/Aspiration details (thin straw): Material enters  airway,  remains ABOVE vocal cords and not ejected out Pharyngeal - Solids Pharyngeal - Puree: Delayed swallow initiation;Premature spillage  to valleculae (trace base of tongue stasis without awarness) Pharyngeal - Regular: Delayed swallow initiation;Premature  spillage to valleculae (delay up to 4 seconds filling vallecular  space) Pharyngeal - Pill: Delayed swallow initiation;Pharyngeal residue  - valleculae;Premature spillage to valleculae;Reduced tongue base  retraction;Reduced laryngeal elevation;Reduced airway/laryngeal  closure (pt did not sense barium tablet lodged in pharynx) Pharyngeal Phase - Comment Pharyngeal Comment: pt with delayed pharyngeal swallow initiation  and no awareness to tongue base and vallecular stasis, cued dry  swallows helpful but pt was delayed in performance and would  likely be taxing for pt with COPD, cued cough did not fully clear  trace penetration, chin tuck did not prevent penetration of thin  was difficult for pt to perform, nectar liquids resulted in more  stasis than thin, pharyngeal swallow strong with solid and puree  without stasis   Cervical Esophageal Phase    GO    Cervical Esophageal Phase Cervical Esophageal Phase: Impaired Cervical Esophageal Phase - Comment Cervical Esophageal Comment:  After pt consumed a very small  amount of nectar barium *one cup sip and one tsp, pt appeared  with significant distal stasis without awareness that did not  clear, swallowing water boluses aided clearance; further boluses  of thin resulted in distal stasis - again requiring water bolus  to clear.  Pt appeared to clear puree, cracker and barium tablet  in timely fashion.  Would recommend strict esophageal/reflux  precautions for this pt.         Donavan Burnet, MS [facility] SLP [phone]     Scheduled Meds: . acidophilus  1 capsule Oral Daily  . aspirin  81 mg Oral Daily  . bisacodyl  10 mg Rectal Daily  . ceFEPime (MAXIPIME) IV  1 g Intravenous Q8H  . citalopram  20 mg Oral Daily  .  divalproex  500 mg Oral BID  . donepezil  10 mg Oral QHS  . felodipine  5 mg Oral Daily  . gabapentin  100 mg Oral TID  . haloperidol  0.5 mg Oral BID  . hydrochlorothiazide  25 mg Oral Daily  . memantine  5 mg Oral BID  . metFORMIN  500 mg Oral Q breakfast  . potassium chloride  10 mEq Intravenous Q1 Hr x 3  . predniSONE  20 mg Oral Q breakfast  . vancomycin  1,000 mg Intravenous Q12H   Continuous Infusions: . sodium chloride 10 mL/hr (09/24/12 1116)  . dextrose 5 % and 0.9% NaCl 100 mL/hr at 09/23/12 4540

## 2012-09-25 NOTE — Progress Notes (Signed)
CRITICAL VALUE ALERT  Critical value received:  CO2 41, K+ 3.1  Date of notification:  09/25/12  Time of notification:  0436  Critical value read back:yes  Nurse who received alert:  Sandria Bales, rn   MD notified (1st page):  Craige Cotta with [facility]   Time of first page:  [phone] via text page with [email or web address]  MD notified (2nd page):  Time of second page:  Responding MD:  Craige Cotta   Time MD responded:  0458-orders seen in [facility]

## 2012-09-25 NOTE — Progress Notes (Signed)
Patient ID: Joel Summers, male   DOB: 19-Feb-1930, 79 y.o.   MRN: [id number] Request received for placement of a percutaneous gastrostomy tube in pt with history of dementia, moderate dysphagia with high aspiration risk, asp PNA and severe protein calorie malnutrition. Additional PMH as below. Exam: pt awake but confused; chest- distant BS bilat/dim RLL; heart- RRR; abd- soft,+BS,NT; ext- no edema.   Filed Vitals:   09/24/12 2129 09/24/12 2215 09/25/12 0438 09/25/12 1400  BP: 136/58  137/66 134/58  Pulse: 82 90 69 68  Temp: 98.5 F (36.9 C)  98 F (36.7 C) 97.1 F (36.2 C)  TempSrc: Oral  Oral Axillary  Resp: 22  20 18   Height:      Weight:   166 lb 7.2 oz (75.5 kg)   SpO2: 97%  95% 91%   Past Medical History  Diagnosis Date  . Altered mental status   . COPD (chronic obstructive pulmonary disease)   . Diabetes mellitus without complication   . Hypertension   . Cancer   . Lung cancer    Past Surgical History  Procedure Laterality Date  . Video bronchoscopy Bilateral 09/24/2012    Procedure: VIDEO BRONCHOSCOPY WITHOUT FLUORO;  Surgeon: Oretha Milch, MD;  Location: [facility];  Service: Cardiopulmonary;  Laterality: Bilateral;   Dg Chest 2 View  09/20/2012   *RADIOLOGY REPORT*  Clinical Data: Fall, weakness, unable to walk well, the kidney, cough, history lung cancer, hypertension, COPD  CHEST - 2 VIEW  Comparison: Exam at 1603 hours compared to earlier study of 0425 hours  Findings: Mild volume loss in right hemithorax again noted. Enlargement of cardiac silhouette with tortuous aorta. Pulmonary vascular congestion. Right perihilar opacity compatible with tumor and post-treatment changes. Bibasilar atelectasis versus infiltrates with small right pleural effusion. No pneumothorax. Bones demineralized.  IMPRESSION: Right perihilar opacity consistent with lung cancer and post- therapy changes. COPD changes with bibasilar atelectasis versus infiltrates and small right pleural effusion.    Original Report Authenticated By: Ulyses Southward, M.D.   Dg Chest 2 View  09/20/2012   *RADIOLOGY REPORT*  Clinical Data: Status post fall.  Chest pain.  History of lung cancer.  CHEST - 2 VIEW  Comparison: None.  Findings: There is volume loss in the right chest with a mass-like opacity in the right upper lobe.  Blunting of the right costophrenic angle could be due to a small effusion or scar. Surgical clips right hilum noted.  The left lung appears clear. Heart size is normal.  IMPRESSION: Post-treatment change right chest with a mass-like opacity right upper lobe compatible with history of lung cancer.  Small right pleural effusion.   Original Report Authenticated By: Holley Dexter, M.D.   Ct Head Wo Contrast  09/20/2012   *RADIOLOGY REPORT*  Clinical Data:  Status post fall with a blow to the head.  CT HEAD WITHOUT CONTRAST CT CERVICAL SPINE WITHOUT CONTRAST  Technique:  Multidetector CT imaging of the head and cervical spine was performed following the standard protocol without intravenous contrast.  Multiplanar CT image reconstructions of the cervical spine were also generated.  Comparison:  None.  CT HEAD  Findings: The brain is atrophic with chronic microvascular ischemic change.  No acute intracranial abnormality including infarction, hemorrhage, mass lesion, mass effect, midline shift or abnormal extra-axial fluid collection is identified.  Hematoma over the frontal bone is noted.  No fracture is seen.  IMPRESSION:  1.  Hematoma over the frontal bone without underlying fracture or for acute intracranial  abnormality. 2.  Atrophy and chronic microvascular ischemic change.  CT CERVICAL SPINE  Findings: No fracture or subluxation of the cervical spine is identified. Loss of disc space height and endplate spurring are most notable at C5-6 and C6-7.  Multilevel facet degenerative change is worse on the left.  Paraspinous soft tissue structures are unremarkable.  Apical scarring on the right is noted.   IMPRESSION: No acute finding.  Degenerative disc disease C5-6 and C6-7.   Original Report Authenticated By: Holley Dexter, M.D.   Ct Chest W Contrast  09/21/2012   *RADIOLOGY REPORT*  Clinical Data: Evaluate right upper lobe lung mass. History lung cancer.  Elevated white blood cells.  Acute respiratory distress. History of dementia.  CT CHEST WITH CONTRAST  Technique:  Multidetector CT imaging of the chest was performed following the standard protocol during bolus administration of intravenous contrast.  Contrast: 80mL OMNIPAQUE IOHEXOL 300 MG/ML  SOLN  Comparison: Plain film of 09/20/2012.  No prior CT.  Findings: Lungs/pleura: Mild motion degradation.  Material in the right mainstem bronchus on image 38/series 6 and lower lobe bronchi on image 43/series 6.  Right greater than left patchy bibasilar airspace disease.  Right suprahilar confluent pulmonary opacity with bronchiectasis.  This is identified lateral and anterior to surgical sutures, including on image 39/series 6.  Trace left pleural fluid.  Small right-sided pleural effusion with minimal wall thickening or loculation anteriorly.  Surgical sutures about right lower lobe bronchi.  Heart/Mediastinum: Mild cardiomegaly.  Coronary artery atherosclerosis.  Main pulmonary artery enlargement. No central pulmonary embolism, on this non-dedicated study.  Mediastinal deviation to the right with volume loss in the right hemithorax.  Right paratracheal node 1.2 cm on image 28. Subcarinal node 1.7 cm on image 41.  Upper abdomen: Fatty atrophy of the pancreas.  Normal imaged adrenal glands.  Bones/Musculoskeletal:  Postsurgical or post-traumatic defects of right-sided ribs.  IMPRESSION:  1.  Findings which are highly suspicious for aspiration.  Debris within the right-sided endobronchial tree with bibasilar, right greater than left patchy airspace opacities. 2.  Right-sided pulmonary process which is primarily felt to be treatment/radiation related.  No  convincing evidence of dominant residual or recurrent primary tumor.  CT follow-up and consideration of eventual PET may be informative.  In addition, comparison with prior exams would be useful. 3.  Thoracic adenopathy, for which which metastatic disease is suspected.  Alternatively, these nodes could be reactive. 4.  Right greater than left bilateral pleural effusions. 5. Pulmonary artery enlargement suggests pulmonary arterial hypertension.   Original Report Authenticated By: Jeronimo Greaves, M.D.   Ct Cervical Spine Wo Contrast  09/20/2012   *RADIOLOGY REPORT*  Clinical Data:  Status post fall with a blow to the head.  CT HEAD WITHOUT CONTRAST CT CERVICAL SPINE WITHOUT CONTRAST  Technique:  Multidetector CT imaging of the head and cervical spine was performed following the standard protocol without intravenous contrast.  Multiplanar CT image reconstructions of the cervical spine were also generated.  Comparison:  None.  CT HEAD  Findings: The brain is atrophic with chronic microvascular ischemic change.  No acute intracranial abnormality including infarction, hemorrhage, mass lesion, mass effect, midline shift or abnormal extra-axial fluid collection is identified.  Hematoma over the frontal bone is noted.  No fracture is seen.  IMPRESSION:  1.  Hematoma over the frontal bone without underlying fracture or for acute intracranial abnormality. 2.  Atrophy and chronic microvascular ischemic change.  CT CERVICAL SPINE  Findings: No fracture or subluxation of the  cervical spine is identified. Loss of disc space height and endplate spurring are most notable at C5-6 and C6-7.  Multilevel facet degenerative change is worse on the left.  Paraspinous soft tissue structures are unremarkable.  Apical scarring on the right is noted.  IMPRESSION: No acute finding.  Degenerative disc disease C5-6 and C6-7.   Original Report Authenticated By: Holley Dexter, M.D.   Dg Chest Port 1 View  09/24/2012   *RADIOLOGY REPORT*   Clinical Data: Post bronchoscopy  PORTABLE CHEST - 1 VIEW  Comparison: Study obtained earlier in the day.  Findings:  No pneumothorax.  There is scarring in the right mid lung with retraction and volume loss.  There is apical pleural thickening on the right.  There is scarring in the left base as well.  There is no new opacity.  Heart size and pulmonary vascularity are within normal limits given the scarring and retraction on the right.  There is underlying emphysema.  IMPRESSION: No appreciable change compared to study obtained earlier in the day.  No pneumothorax.   Original Report Authenticated By: Bretta Bang, M.D.   Dg Chest Port 1 View  09/24/2012   *RADIOLOGY REPORT*  Clinical Data: Follow up airspace disease  PORTABLE CHEST - 1 VIEW  Comparison: 09/20/2012  Findings: Right perihilar density unchanged.  Right pleural effusion/pleural scarring unchanged.  There is right lower lobe collapse which has developed in the interval.  This may be due to endobronchial plugging from mucus or aspiration.  Mild left lower lobe infiltrate unchanged.  No effusion on the left.  Underlying COPD.  IMPRESSION: Right lower lobe collapse has occurred in the interval.  There is a small right effusion/pleural scarring.  Mild left lower lobe infiltrate is unchanged.   Original Report Authenticated By: Janeece Riggers, M.D.   Dg Swallowing Func-speech Pathology  09/23/2012   Chales Abrahams, CCC-SLP     09/23/2012  3:21 PM Objective Swallowing Evaluation: Modified Barium Swallowing Study   Patient Details  Name: AARYN SERMON MRN: [id number] Date of Birth: 06/17/1930  Today's Date: 09/23/2012 Time: 7829-5621 SLP Time Calculation (min): 30 min  Past Medical History:  Past Medical History  Diagnosis Date  . Altered mental status   . COPD (chronic obstructive pulmonary disease)   . Diabetes mellitus without complication   . Hypertension   . Cancer   . Lung cancer    Past Surgical History: History reviewed. No pertinent past   surgical history. HPI:  79 yo resident of ALF [facility])  admitted to [facility]  after falling-pt found to have pna.  PMH + for HTN, COPD,  dementia.  Pt CT head showed hematoma over frontal bone, atrophy.   Pt with possible debris in right lung concerning for aspiration.   Pt denies dysphagia but given his dementia, uncertain of ability  to recall information.  BSE completed with indications for MBS  and order was obtained.       Assessment / Plan / Recommendation Clinical Impression  Dysphagia Diagnosis: Moderate oral phase dysphagia;Moderate  pharyngeal phase dysphagia (suspect esophageal component as well)   Clinical impression: Pt presents with moderate oropharyngeal  sensorimotor dysphagia with decreased oral bolus containment from  weakness resulting in premature spillage of barium into pharynx  and oral residuals (mostly with liquids) without pt sensation.   Pt's pharyngeal swallow characterized by delayed initiation  (cracker accumulating in vallecular space for 4 seconds prior to  swallow) and liquids spilled to pyriform sinus prior to swallow.  Decreased tongue base retraction, laryngeal elevation, pharyngeal  contraction result in pharyngeal stasis with liquids and barium  tablet WITHOUT awareness and laryngeal penetration of both  nectar/thin consistencies.  Chin tuck not helpful and cued cough  was WEAK and did not clear penetrates.  Barium tablet given with  pudding precariously lodged at vallecular space without pt  awareness, thin aided clearance but with penetration.   Given pt's increased dyspenia with effort, delayed swallow  responses, no awareness to stasis and weak nonprotective cough,  he is deemed a high aspiration risk with po.  Suspect pt is  aspirating secretions as barium mixed with secretions from oral  cavity and were penetrated.  Skilled intervention included  educating pt to findings via video monitor, providing visual and  verbal feedback to compensation strategies.     Options may be to allow po with known risk and strategies to  mitigate risk or keep npo except medicine crushed with applesauce  and ice prn after oral care.   Would need repeat MBS after  medical/respiratory improvement due to sensory motor deficits.   Unfortunately pt coughed throughout entire MBS making clinical  evaluation inaccurate and pt's cough is weak and nonprotective.    SLP phoned ALF after MBS and spoke to [place], resident care  Production designer, theatre/television/film, who reported pt was on a regular/thin diet and took  medications with water with good tolerance as far as she knew.   Grover Canavan stated pt did not have significant weight loss nor  pulmonary infections recently as far as she knew.         Treatment Recommendation  Therapy as outlined in treatment plan below    Diet Recommendation NPO;Ice chips PRN after oral care (vs po of  pureed/thin with risks accepted)   Medication Administration: Crushed with puree    Other  Recommendations Recommended Consults: MBS Oral Care Recommendations: Oral care BID   Follow Up Recommendations  Skilled Nursing facility    Frequency and Duration min 2x/week  2 weeks   Pertinent Vitals/Pain Afebrile, nonproductive congested cough, pt  unable to clear, occasional wet voice-? Asp of secretions    SLP Swallow Goals Patient will utilize recommended strategies during swallow to  increase swallowing safety with: Total assistance Goal #3: Pt and MD will determine if desire for pt to eat with  known aspiration risk and strategies to mitigate it.     General HPI: 79 yo resident of ALF [facility])   admitted to [facility] after falling-pt found to have pna.  PMH + for  HTN, COPD, dementia.  Pt CT head showed hematoma over frontal  bone, atrophy.  Pt with possible debris in right lung concerning  for aspiration.  Pt denies dysphagia but given his dementia,  uncertain of ability to recall information.  BSE completed with  indications for MBS and order was obtained.   Type of Study: Modified Barium  Swallowing Study Reason for Referral: Objectively evaluate swallowing function Previous Swallow Assessment: none in epic Diet Prior to this Study: NPO Temperature Spikes Noted: No Respiratory Status: Supplemental O2 delivered via (comment) History of Recent Intubation: No Behavior/Cognition:  Alert;Cooperative;Confused;Impulsive;Distractible;Decreased  sustained attention Oral Cavity - Dentition: Edentulous Oral Motor / Sensory Function: Impaired - see Bedside swallow  eval Self-Feeding Abilities: Needs assist Patient Positioning: Upright in chair Baseline Vocal Quality: Low vocal intensity Volitional Cough: Weak;Congested Volitional Swallow: Able to elicit Anatomy: Within functional limits Pharyngeal Secretions: Standing secretions in (comment)    Reason for Referral Objectively evaluate swallowing  function   Oral Phase Oral Preparation/Oral Phase Oral Phase: Impaired Oral - Nectar Oral - Nectar Teaspoon: Reduced posterior propulsion;Piecemeal  swallowing;Delayed oral transit;Weak lingual manipulation Oral - Nectar Cup: Reduced posterior propulsion;Piecemeal  swallowing;Delayed oral transit;Weak lingual manipulation Oral - Thin Oral - Thin Teaspoon: Delayed oral transit;Piecemeal  swallowing;Weak lingual manipulation Oral - Thin Cup: Piecemeal swallowing;Reduced posterior  propulsion;Weak lingual manipulation;Lingual/palatal residue Oral - Thin Straw: Piecemeal swallowing;Reduced posterior  propulsion;Weak lingual manipulation;Lingual/palatal residue Oral - Solids Oral - Puree: Reduced posterior propulsion;Delayed oral  transit;Weak lingual manipulation Oral - Regular: Delayed oral transit;Impaired mastication;Reduced  posterior propulsion;Lingual/palatal residue;Weak lingual  manipulation Oral - Pill: Reduced posterior propulsion;Delayed oral  transit;Weak lingual manipulation Oral Phase - Comment Oral Phase - Comment: oral residuals prematurely spill into  pharynx without pt awareness- at times pt required cues  to  swallow and response was delayed   Pharyngeal Phase Pharyngeal Phase Pharyngeal Phase: Impaired Pharyngeal - Nectar Pharyngeal - Nectar Teaspoon: Delayed swallow  initiation;Pharyngeal residue - valleculae;Reduced laryngeal  elevation;Reduced airway/laryngeal closure;Reduced tongue base  retraction;Reduced pharyngeal peristalsis Pharyngeal - Nectar Cup: Delayed swallow initiation;Premature  spillage to pyriform sinuses;Reduced airway/laryngeal  closure;Reduced laryngeal elevation;Reduced tongue base  retraction;Pharyngeal residue - valleculae;Pharyngeal residue -  pyriform sinuses Pharyngeal - Thin Pharyngeal - Thin Teaspoon: Premature spillage to pyriform  sinuses;Reduced airway/laryngeal closure;Reduced laryngeal  elevation;Delayed swallow initiation;Reduced pharyngeal  peristalsis;Pharyngeal residue - valleculae (swallow triggered at  approx 2 seconds at vallecular space) Pharyngeal - Thin Cup: Premature spillage to valleculae;Delayed  swallow initiation;Premature spillage to pyriform  sinuses;Penetration/Aspiration during swallow;Reduced laryngeal  elevation;Reduced airway/laryngeal closure;Reduced tongue base  retraction;Reduced pharyngeal peristalsis;Pharyngeal residue -  valleculae Penetration/Aspiration details (thin cup): Material enters  airway, remains ABOVE vocal cords and not ejected out Pharyngeal - Thin Straw: Delayed swallow initiation;Premature  spillage to pyriform sinuses;Penetration/Aspiration during  swallow;Penetration/Aspiration after swallow;Reduced  airway/laryngeal closure;Reduced laryngeal elevation;Reduced  pharyngeal peristalsis Penetration/Aspiration details (thin straw): Material enters  airway, remains ABOVE vocal cords and not ejected out Pharyngeal - Solids Pharyngeal - Puree: Delayed swallow initiation;Premature spillage  to valleculae (trace base of tongue stasis without awarness) Pharyngeal - Regular: Delayed swallow initiation;Premature  spillage to valleculae (delay up to 4  seconds filling vallecular  space) Pharyngeal - Pill: Delayed swallow initiation;Pharyngeal residue  - valleculae;Premature spillage to valleculae;Reduced tongue base  retraction;Reduced laryngeal elevation;Reduced airway/laryngeal  closure (pt did not sense barium tablet lodged in pharynx) Pharyngeal Phase - Comment Pharyngeal Comment: pt with delayed pharyngeal swallow initiation  and no awareness to tongue base and vallecular stasis, cued dry  swallows helpful but pt was delayed in performance and would  likely be taxing for pt with COPD, cued cough did not fully clear  trace penetration, chin tuck did not prevent penetration of thin  was difficult for pt to perform, nectar liquids resulted in more  stasis than thin, pharyngeal swallow strong with solid and puree  without stasis   Cervical Esophageal Phase    GO    Cervical Esophageal Phase Cervical Esophageal Phase: Impaired Cervical Esophageal Phase - Comment Cervical Esophageal Comment: After pt consumed a very small  amount of nectar barium *one cup sip and one tsp, pt appeared  with significant distal stasis without awareness that did not  clear, swallowing water boluses aided clearance; further boluses  of thin resulted in distal stasis - again requiring water bolus  to clear.  Pt appeared to clear puree, cracker and barium tablet  in timely fashion.  Would recommend strict esophageal/reflux  precautions for this pt.  Donavan Burnet, MS [facility] SLP [phone]   Results for orders placed during the hospital encounter of 09/20/12  URINE CULTURE      Result Value Range   Specimen Description URINE, RANDOM     Special Requests NONE     Culture  Setup Time       Value: 09/20/2012 22:14     Performed at [facility]   Colony Count       Value: NO GROWTH     Performed at [facility]   Culture       Value: NO GROWTH     Performed at [facility]   Report Status 09/21/2012 FINAL    CULTURE, BLOOD (ROUTINE X 2)      Result  Value Range   Specimen Description BLOOD RIGHT HAND     Special Requests BOTTLES DRAWN AEROBIC AND ANAEROBIC     Culture  Setup Time       Value: 09/21/2012 03:01     Performed at [facility]   Culture       Value:        BLOOD CULTURE RECEIVED NO GROWTH TO DATE CULTURE WILL BE HELD FOR 5 DAYS BEFORE ISSUING A FINAL NEGATIVE REPORT     Performed at [facility]   Report Status PENDING    CULTURE, BLOOD (ROUTINE X 2)      Result Value Range   Specimen Description BLOOD RIGHT ARM     Special Requests BOTTLES DRAWN AEROBIC AND ANAEROBIC     Culture  Setup Time       Value: 09/21/2012 03:01     Performed at [facility]   Culture       Value:        BLOOD CULTURE RECEIVED NO GROWTH TO DATE CULTURE WILL BE HELD FOR 5 DAYS BEFORE ISSUING A FINAL NEGATIVE REPORT     Performed at [facility]   Report Status PENDING    MRSA PCR SCREENING      Result Value Range   MRSA by PCR NEGATIVE  NEGATIVE  AFB CULTURE WITH SMEAR      Result Value Range   Specimen Description BRONCHIAL ALVEOLAR LAVAGE     Special Requests NONE     ACID FAST SMEAR       Value: NO ACID FAST BACILLI SEEN     Performed at [facility]   Culture       Value: CULTURE WILL BE EXAMINED FOR 6 WEEKS BEFORE ISSUING A FINAL REPORT     Performed at [facility]   Report Status PENDING    CULTURE, BAL-QUANTITATIVE      Result Value Range   Specimen Description BRONCHIAL ALVEOLAR LAVAGE     Special Requests NONE     Gram Stain       Value: ABUNDANT WBC PRESENT,BOTH PMN AND MONONUCLEAR     NO SQUAMOUS EPITHELIAL CELLS SEEN     NO ORGANISMS SEEN     Performed at [facility] Count PENDING     Culture       Value: Culture reincubated for better growth     Performed at [facility]   Report Status PENDING    FUNGUS CULTURE W SMEAR      Result Value Range   Specimen Description BRONCHIAL ALVEOLAR LAVAGE     Special Requests NONE     Fungal  Smear  Value: NO YEAST OR FUNGAL ELEMENTS SEEN     Performed at [facility]   Culture       Value: CULTURE IN PROGRESS FOR FOUR WEEKS     Performed at [facility]   Report Status PENDING    CBC WITH DIFFERENTIAL      Result Value Range   WBC 18.8 (*) 4.0 - 10.5 K/uL   RBC 3.71 (*) 4.22 - 5.81 MIL/uL   Hemoglobin 11.4 (*) 13.0 - 17.0 g/dL   HCT 16.1 (*) 09.6 - 04.5 %   MCV 89.5  78.0 - 100.0 fL   MCH 30.7  26.0 - 34.0 pg   MCHC 34.3  30.0 - 36.0 g/dL   RDW 40.9  81.1 - 91.4 %   Platelets 453 (*) 150 - 400 K/uL   Neutrophils Relative % 84 (*) 43 - 77 %   Neutro Abs 15.8 (*) 1.7 - 7.7 K/uL   Lymphocytes Relative 4 (*) 12 - 46 %   Lymphs Abs 0.8  0.7 - 4.0 K/uL   Monocytes Relative 12  3 - 12 %   Monocytes Absolute 2.3 (*) 0.1 - 1.0 K/uL   Eosinophils Relative 0  0 - 5 %   Eosinophils Absolute 0.0  0.0 - 0.7 K/uL   Basophils Relative 0  0 - 1 %   Basophils Absolute 0.0  0.0 - 0.1 K/uL  URINALYSIS, ROUTINE W REFLEX MICROSCOPIC      Result Value Range   Color, Urine AMBER (*) YELLOW   APPearance CLEAR  CLEAR   Specific Gravity, Urine 1.026  1.005 - 1.030   pH 5.5  5.0 - 8.0   Glucose, UA NEGATIVE  NEGATIVE mg/dL   Hgb urine dipstick TRACE (*) NEGATIVE   Bilirubin Urine SMALL (*) NEGATIVE   Ketones, ur NEGATIVE  NEGATIVE mg/dL   Protein, ur NEGATIVE  NEGATIVE mg/dL   Urobilinogen, UA 2.0 (*) 0.0 - 1.0 mg/dL   Nitrite NEGATIVE  NEGATIVE   Leukocytes, UA NEGATIVE  NEGATIVE  COMPREHENSIVE METABOLIC PANEL      Result Value Range   Sodium 137  135 - 145 mEq/L   Potassium 3.9  3.5 - 5.1 mEq/L   Chloride 95 (*) 96 - 112 mEq/L   CO2 31  19 - 32 mEq/L   Glucose, Bld 207 (*) 70 - 99 mg/dL   BUN 18  6 - 23 mg/dL   Creatinine, Ser 7.82  0.50 - 1.35 mg/dL   Calcium 8.8  8.4 - 95.6 mg/dL   Total Protein 6.5  6.0 - 8.3 g/dL   Albumin 2.4 (*) 3.5 - 5.2 g/dL   AST 19  0 - 37 U/L   ALT 19  0 - 53 U/L   Alkaline Phosphatase 91  39 - 117 U/L   Total Bilirubin 0.5   0.3 - 1.2 mg/dL   GFR calc non Af Amer 81 (*) >90 mL/min   GFR calc Af Amer >90  >90 mL/min  VALPROIC ACID LEVEL      Result Value Range   Valproic Acid Lvl 56.3  50.0 - 100.0 ug/mL  LEGIONELLA ANTIGEN, URINE      Result Value Range   Specimen Description URINE, CLEAN CATCH     Special Requests NONE     Legionella Antigen, Urine       Value: Negative for Legionella pneumophilia serogroup 1     Performed at [facility]   Report Status 09/21/2012 FINAL  STREP PNEUMONIAE URINARY ANTIGEN      Result Value Range   Strep Pneumo Urinary Antigen NEGATIVE  NEGATIVE  URINE MICROSCOPIC-ADD ON      Result Value Range   RBC / HPF 0-2  <3 RBC/hpf   Casts HYALINE CASTS (*) NEGATIVE   Urine-Other MUCOUS PRESENT    COMPREHENSIVE METABOLIC PANEL      Result Value Range   Sodium 138  135 - 145 mEq/L   Potassium 3.6  3.5 - 5.1 mEq/L   Chloride 96  96 - 112 mEq/L   CO2 33 (*) 19 - 32 mEq/L   Glucose, Bld 158 (*) 70 - 99 mg/dL   BUN 15  6 - 23 mg/dL   Creatinine, Ser 1.61  0.50 - 1.35 mg/dL   Calcium 8.9  8.4 - 09.6 mg/dL   Total Protein 6.6  6.0 - 8.3 g/dL   Albumin 2.4 (*) 3.5 - 5.2 g/dL   AST 18  0 - 37 U/L   ALT 18  0 - 53 U/L   Alkaline Phosphatase 92  39 - 117 U/L   Total Bilirubin 0.3  0.3 - 1.2 mg/dL   GFR calc non Af Amer 86 (*) >90 mL/min   GFR calc Af Amer >90  >90 mL/min  MAGNESIUM      Result Value Range   Magnesium 1.8  1.5 - 2.5 mg/dL  PHOSPHORUS      Result Value Range   Phosphorus 2.9  2.3 - 4.6 mg/dL  CBC WITH DIFFERENTIAL      Result Value Range   WBC 20.9 (*) 4.0 - 10.5 K/uL   RBC 3.74 (*) 4.22 - 5.81 MIL/uL   Hemoglobin 11.5 (*) 13.0 - 17.0 g/dL   HCT 04.5 (*) 40.9 - 81.1 %   MCV 90.1  78.0 - 100.0 fL   MCH 30.7  26.0 - 34.0 pg   MCHC 34.1  30.0 - 36.0 g/dL   RDW 91.4  78.2 - 95.6 %   Platelets 433 (*) 150 - 400 K/uL   Neutrophils Relative % 83 (*) 43 - 77 %   Neutro Abs 17.3 (*) 1.7 - 7.7 K/uL   Lymphocytes Relative 6 (*) 12 - 46 %   Lymphs Abs  1.3  0.7 - 4.0 K/uL   Monocytes Relative 11  3 - 12 %   Monocytes Absolute 2.3 (*) 0.1 - 1.0 K/uL   Eosinophils Relative 0  0 - 5 %   Eosinophils Absolute 0.0  0.0 - 0.7 K/uL   Basophils Relative 0  0 - 1 %   Basophils Absolute 0.0  0.0 - 0.1 K/uL  APTT      Result Value Range   aPTT 37  24 - 37 seconds  PROTIME-INR      Result Value Range   Prothrombin Time 15.7 (*) 11.6 - 15.2 seconds   INR 1.28  0.00 - 1.49  TSH      Result Value Range   TSH 1.152  0.350 - 4.500 uIU/mL  COMPREHENSIVE METABOLIC PANEL      Result Value Range   Sodium 136  135 - 145 mEq/L   Potassium 3.9  3.5 - 5.1 mEq/L   Chloride 97  96 - 112 mEq/L   CO2 32  19 - 32 mEq/L   Glucose, Bld 183 (*) 70 - 99 mg/dL   BUN 13  6 - 23 mg/dL   Creatinine, Ser 2.13  0.50 - 1.35 mg/dL   Calcium 8.9  8.4 - 10.5 mg/dL   Total Protein 6.1  6.0 - 8.3 g/dL   Albumin 2.2 (*) 3.5 - 5.2 g/dL   AST 21  0 - 37 U/L   ALT 16  0 - 53 U/L   Alkaline Phosphatase 93  39 - 117 U/L   Total Bilirubin 0.3  0.3 - 1.2 mg/dL   GFR calc non Af Amer >90  >90 mL/min   GFR calc Af Amer >90  >90 mL/min  CBC      Result Value Range   WBC 18.4 (*) 4.0 - 10.5 K/uL   RBC 3.55 (*) 4.22 - 5.81 MIL/uL   Hemoglobin 11.0 (*) 13.0 - 17.0 g/dL   HCT 40.9 (*) 81.1 - 91.4 %   MCV 89.9  78.0 - 100.0 fL   MCH 31.0  26.0 - 34.0 pg   MCHC 34.5  30.0 - 36.0 g/dL   RDW 78.2  95.6 - 21.3 %   Platelets 406 (*) 150 - 400 K/uL  GLUCOSE, CAPILLARY      Result Value Range   Glucose-Capillary 149 (*) 70 - 99 mg/dL   Comment 1 Notify RN    GLUCOSE, CAPILLARY      Result Value Range   Glucose-Capillary 144 (*) 70 - 99 mg/dL   Comment 1 Documented in Chart     Comment 2 Notify RN    GLUCOSE, CAPILLARY      Result Value Range   Glucose-Capillary 170 (*) 70 - 99 mg/dL  CBC      Result Value Range   WBC 15.2 (*) 4.0 - 10.5 K/uL   RBC 3.79 (*) 4.22 - 5.81 MIL/uL   Hemoglobin 11.7 (*) 13.0 - 17.0 g/dL   HCT 08.6 (*) 57.8 - 46.9 %   MCV 90.5  78.0 - 100.0 fL    MCH 30.9  26.0 - 34.0 pg   MCHC 34.1  30.0 - 36.0 g/dL   RDW 62.9  52.8 - 41.3 %   Platelets 462 (*) 150 - 400 K/uL  BASIC METABOLIC PANEL      Result Value Range   Sodium 137  135 - 145 mEq/L   Potassium 3.0 (*) 3.5 - 5.1 mEq/L   Chloride 96  96 - 112 mEq/L   CO2 36 (*) 19 - 32 mEq/L   Glucose, Bld 240 (*) 70 - 99 mg/dL   BUN 9  6 - 23 mg/dL   Creatinine, Ser 2.44  0.50 - 1.35 mg/dL   Calcium 8.8  8.4 - 01.0 mg/dL   GFR calc non Af Amer >90  >90 mL/min   GFR calc Af Amer >90  >90 mL/min  GLUCOSE, CAPILLARY      Result Value Range   Glucose-Capillary 195 (*) 70 - 99 mg/dL   Comment 1 Notify RN    GLUCOSE, CAPILLARY      Result Value Range   Glucose-Capillary 209 (*) 70 - 99 mg/dL   Comment 1 Notify RN    GLUCOSE, CAPILLARY      Result Value Range   Glucose-Capillary 211 (*) 70 - 99 mg/dL   Comment 1 Notify RN    GLUCOSE, CAPILLARY      Result Value Range   Glucose-Capillary 183 (*) 70 - 99 mg/dL   Comment 1 Notify RN    VANCOMYCIN, TROUGH      Result Value Range   Vancomycin Tr 14.0  10.0 - 20.0 ug/mL  GLUCOSE, CAPILLARY      Result Value  Range   Glucose-Capillary 157 (*) 70 - 99 mg/dL   Comment 1 Notify RN    GLUCOSE, CAPILLARY      Result Value Range   Glucose-Capillary 170 (*) 70 - 99 mg/dL  CBC      Result Value Range   WBC 10.4  4.0 - 10.5 K/uL   RBC 3.84 (*) 4.22 - 5.81 MIL/uL   Hemoglobin 11.7 (*) 13.0 - 17.0 g/dL   HCT 46.9 (*) 62.9 - 52.8 %   MCV 90.1  78.0 - 100.0 fL   MCH 30.5  26.0 - 34.0 pg   MCHC 33.8  30.0 - 36.0 g/dL   RDW 41.3  24.4 - 01.0 %   Platelets 490 (*) 150 - 400 K/uL  BASIC METABOLIC PANEL      Result Value Range   Sodium 136  135 - 145 mEq/L   Potassium 2.5 (*) 3.5 - 5.1 mEq/L   Chloride 94 (*) 96 - 112 mEq/L   CO2 40 (*) 19 - 32 mEq/L   Glucose, Bld 196 (*) 70 - 99 mg/dL   BUN 5 (*) 6 - 23 mg/dL   Creatinine, Ser 2.72  0.50 - 1.35 mg/dL   Calcium 8.9  8.4 - 53.6 mg/dL   GFR calc non Af Amer >90  >90 mL/min   GFR calc Af  Amer >90  >90 mL/min  GLUCOSE, CAPILLARY      Result Value Range   Glucose-Capillary 175 (*) 70 - 99 mg/dL   Comment 1 Notify RN    GLUCOSE, CAPILLARY      Result Value Range   Glucose-Capillary 194 (*) 70 - 99 mg/dL   Comment 1 Notify RN    GLUCOSE, CAPILLARY      Result Value Range   Glucose-Capillary 182 (*) 70 - 99 mg/dL   Comment 1 Notify RN    GLUCOSE, CAPILLARY      Result Value Range   Glucose-Capillary 176 (*) 70 - 99 mg/dL   Comment 1 Notify RN    URINALYSIS, ROUTINE W REFLEX MICROSCOPIC      Result Value Range   Color, Urine YELLOW  YELLOW   APPearance CLEAR  CLEAR   Specific Gravity, Urine 1.009  1.005 - 1.030   pH 7.0  5.0 - 8.0   Glucose, UA NEGATIVE  NEGATIVE mg/dL   Hgb urine dipstick NEGATIVE  NEGATIVE   Bilirubin Urine NEGATIVE  NEGATIVE   Ketones, ur NEGATIVE  NEGATIVE mg/dL   Protein, ur NEGATIVE  NEGATIVE mg/dL   Urobilinogen, UA 0.2  0.0 - 1.0 mg/dL   Nitrite NEGATIVE  NEGATIVE   Leukocytes, UA NEGATIVE  NEGATIVE  GLUCOSE, CAPILLARY      Result Value Range   Glucose-Capillary 182 (*) 70 - 99 mg/dL   Comment 1 Notify RN    BASIC METABOLIC PANEL      Result Value Range   Sodium 136  135 - 145 mEq/L   Potassium 3.1 (*) 3.5 - 5.1 mEq/L   Chloride 92 (*) 96 - 112 mEq/L   CO2 41 (*) 19 - 32 mEq/L   Glucose, Bld 172 (*) 70 - 99 mg/dL   BUN 9  6 - 23 mg/dL   Creatinine, Ser 6.44  0.50 - 1.35 mg/dL   Calcium 8.6  8.4 - 03.4 mg/dL   GFR calc non Af Amer >90  >90 mL/min   GFR calc Af Amer >90  >90 mL/min  GLUCOSE, CAPILLARY      Result  Value Range   Glucose-Capillary 191 (*) 70 - 99 mg/dL  CBC      Result Value Range   WBC 9.5  4.0 - 10.5 K/uL   RBC 3.56 (*) 4.22 - 5.81 MIL/uL   Hemoglobin 11.0 (*) 13.0 - 17.0 g/dL   HCT 10.2 (*) 72.5 - 36.6 %   MCV 89.9  78.0 - 100.0 fL   MCH 30.9  26.0 - 34.0 pg   MCHC 34.4  30.0 - 36.0 g/dL   RDW 44.0  34.7 - 42.5 %   Platelets 454 (*) 150 - 400 K/uL  GLUCOSE, CAPILLARY      Result Value Range    Glucose-Capillary 203 (*) 70 - 99 mg/dL  GLUCOSE, CAPILLARY      Result Value Range   Glucose-Capillary 146 (*) 70 - 99 mg/dL  GLUCOSE, CAPILLARY      Result Value Range   Glucose-Capillary 122 (*) 70 - 99 mg/dL   Comment 1 Notify RN    GLUCOSE, CAPILLARY      Result Value Range   Glucose-Capillary 132 (*) 70 - 99 mg/dL   Comment 1 Notify RN    GLUCOSE, CAPILLARY      Result Value Range   Glucose-Capillary 170 (*) 70 - 99 mg/dL   Comment 1 Notify RN    CG4 I-STAT (LACTIC ACID)      Result Value Range   Lactic Acid, Venous 1.21  0.5 - 2.2 mmol/L  POCT I-STAT TROPONIN I      Result Value Range   Troponin i, poc 0.08  0.00 - 0.08 ng/mL   Comment 3            A/P: Pt with hx dementia, mod dysphagia with high asp risk, protein calorie malnutrition, asp PNA. Tent plan is for placement of a percutaneous gastrostomy tube on 8/14. Details/risks of procedure d/w pt's daughter, Birder Robson, and pt's legal guardian, Melynda Keller, with their understanding and consent.

## 2012-09-25 NOTE — Progress Notes (Signed)
PULMONARY  / CRITICAL CARE MEDICINE  Name: Joel Summers MRN: [id number] DOB: 08-09-1930    ADMISSION DATE:  09/20/2012 CONSULTATION DATE:  09/22/12  REFERRING MD :  Elisabeth Pigeon PRIMARY SERVICE:  [facility]  CHIEF COMPLAINT:  Eval abn CXR  BRIEF PATIENT DESCRIPTION:  79 y/o WM from nursing home, adm for SOB, cough, and abn CXR w/ extensive right sided changes from hx lung cancer & treatment (no details avail in Epic).  ?Aspiration raised w/ debris seen in right airways on CT Chest.  SIGNIFICANT EVENTS / STUDIES:  8/12 RLL atelectasis  LINES / TUBES:   CULTURES: 8/8 Blood x2>>ng 8/8 Urine>>neg  ANTIBIOTICS: Cefepime 8/8>>  Vancomycin 8/8>>8/11   SUBJECTIVE: Confused -has sitter afebrile   Remains NPO  VITAL SIGNS: Temp:  [98 F (36.7 C)-98.6 F (37 C)] 98 F (36.7 C) (08/13 0438) Pulse Rate:  [69-90] 69 (08/13 0438) Resp:  [18-22] 20 (08/13 0438) BP: (136-138)/(58-66) 137/66 mmHg (08/13 0438) SpO2:  [94 %-97 %] 95 % (08/13 0438) Weight:  [75.5 kg (166 lb 7.2 oz)] 75.5 kg (166 lb 7.2 oz) (08/13 0438)  PHYSICAL EXAMINATION: Constitutional: Elderly, appears chr ill. No distress.  HENT: Normocephalic. Dry mucus membranes.  Eyes: Conjunctivae and EOM are normal. PERRLA, no scleral icterus.  Neck:  No JVD. No tracheal deviation. No thyromegaly.  CVS: RRR, Gr1/6 SEM at lsb w/o rubs or gallops detected Lungs: decr BS on right, no rhonchi Abdominal: Soft & nontender w/o guarding or rebound Musculoskeletal: DJD- sl decr ROM. No edema and no tenderness.  Lymphadenopathy: No lymphadenopathy noted, cervical, inguinal. Neuro: Alert. Not oriented, no focal neurologic deficits  Skin: Skin is warm and dry.    Recent Labs Lab 09/23/12 0354 09/24/12 0340 09/25/12 0325  NA 137 136 136  K 3.0* 2.5* 3.1*  CL 96 94* 92*  CO2 36* 40* 41*  BUN 9 5* 9  CREATININE 0.58 0.55 0.60  GLUCOSE 240* 196* 172*    Recent Labs Lab 09/23/12 0354 09/24/12 0340 09/25/12 0325  HGB 11.7*  11.7* 11.0*  HCT 34.3* 34.6* 32.0*  WBC 15.2* 10.4 9.5  PLT 462* 490* 454*   Dg Chest Port 1 View  09/24/2012   *RADIOLOGY REPORT*  Clinical Data: Post bronchoscopy  PORTABLE CHEST - 1 VIEW  Comparison: Study obtained earlier in the day.  Findings:  No pneumothorax.  There is scarring in the right mid lung with retraction and volume loss.  There is apical pleural thickening on the right.  There is scarring in the left base as well.  There is no new opacity.  Heart size and pulmonary vascularity are within normal limits given the scarring and retraction on the right.  There is underlying emphysema.  IMPRESSION: No appreciable change compared to study obtained earlier in the day.  No pneumothorax.   Original Report Authenticated By: Bretta Bang, M.D.          ASSESSMENT / PLAN:  1)  COPD - records indicate he was on Proventil 2mg Tid, ProventilHFA prn, Robitussin, Pred20mg  & Augmentin/ Levaquin recently in the NH 2)  Hx Lung Cancer w/ surg & ?XRT- we do not have any records or details & this would obviously be of signif value in his current assessment, Endobronchial lesion RLL s/o recurrence 3)  Poss aspiration w/ debris in right sided airways - he is not in distress & not coughing at present 4) RLL atelectasis  Plan: - given his advanced dementia,  he is NOT acandidate for chemo / XRT -  agree w/ NEBS, chest vest bid  - agree w/ empiric Maxipime, Vanco coverage for now - wean Pred rapidly (looks like it was started in the NH), reduced 8/11 to 40 QD - careful w/ diuretic> watch renal & repelte K , Chk  Mg - background medical data, details of his prev lung cancer therapy, etc are very important & efforts being made to obtain this info from [place] [facility]  -Bronchoscopy findings & likely aspiration were discussed with HCPOA. Decision made to issue DNR order. \ - needs swallowing eval -MBS planned , OK to advance diet based on eval, if fails, primary service may have to work through  issues of feeding tube  Cyril Mourning MD. Tonny Bollman. [facility] Pager [phone] If no response call [phone]    Joel Summers V.

## 2012-09-25 NOTE — Progress Notes (Signed)
CSW continuing to follow.  CSW contacted pt legal guardian, [name] to discuss d/c planning needs.   CSW discussed that MD will be contacting legal guardian re: decisions about pt nutritional status and discussed that it will be beneficial to explore SNF options because at this time pt ALF will likely not be able to meet pt nutritional needs.   Pt legal guardian, [name] is agreeable to SNF search in [address] and [address].   CSW contacted MD to reinforce that legal guardian is to make medical decisions for pt. Per legal guardian, pt daughter can be made aware of what is going on medically, but pt daughter should not make decisions re: pt care.   CSW to follow up with legal guardian re: bed offers for SNF.   CSW to continue to follow.   Jacklynn Lewis, MSW, LCSWA  Clinical Social Work [phone]

## 2012-09-25 NOTE — Progress Notes (Signed)
Speech Language Pathology Dysphagia Treatment Patient Details Name: Joel Summers MRN: [id number] DOB: 1930/09/18 Today's Date: 09/25/2012 Time: 2130-8657 SLP Time Calculation (min): 26 min  Assessment / Plan / Recommendation Clinical Impression  Treatment focused on educating pt to importance of oral care several times daily due to suspected aspiration of secretions and NPO status.   SLP had pt brush his gums and tongue including rinsing and expectorating with water without any difficulties noted.   Informed pt of SLP recommendations for pt to consume single ice chips (allowing to melt) for QOL, oral hygeine and to decrease disuse muscle atrophy.  Pt will NEED to conduct several "dry" swallows with each SINGLE ice chip due to sensorimotor deficits allowing stasis without pt awareness.     Observed pt to consume 6 ice chips with him requiring max cues for dry swallow with wet voice but no overt coughing.  Educated Chief Financial Officer to ice chip administration.  Note plan is for pt to have PEG tomorrow for nutrition per MD notes, SLP educated pt that it would not prevent him from aspirating secretions.   SLP used teach back for pt clinical understanding, immediate recall was adequate however he was unable to maintain information several minutes later.   MD please order follow up SLP at next venue of care to maximize pt's swallowing rehab potential for aspiration mitigation.         Diet Recommendation  Continue with Current Diet: NPO (po medicine crushed with applesauce, single ICE CHIPS with FULL Supervision)    SLP Plan Continue with current plan of care   Pertinent Vitals/Pain Afebrile, decreased   Swallowing Goals  SLP Swallowing Goals Patient will utilize recommended strategies during swallow to increase swallowing safety with: Total assistance Swallow Study Goal #2 - Progress: Progressing toward goal Goal #3: Pt and MD will determine if desire for pt to eat with known aspiration risk  and strategies to mitigate it.   Swallow Study Goal #3 - Progress: Met  General Temperature Spikes Noted: No Respiratory Status: Supplemental O2 delivered via (comment) Behavior/Cognition: Alert;Cooperative;Confused;Decreased sustained attention Oral Cavity - Dentition: Edentulous Patient Positioning: Upright in bed  Oral Cavity - Oral Hygiene Does patient have any of the following "at risk" factors?: Oxygen therapy - cannula, mask, simple oxygen devices;Saliva - thick, dry mouth Patient is HIGH RISK - Oral Care Protocol followed (see row info): Yes   Dysphagia Treatment Treatment focused on: Patient/family/caregiver education;Other (comment) (intake of ice chips for dysphagia tx after oral care) Family/Caregiver Educated: pt, no family present Patient observed directly with PO's: Yes Type of PO's observed: Ice chips;Dysphagia 1 (puree) (applesauce with RN (with crush medications)) Feeding: Needs assist Pharyngeal Phase Signs & Symptoms: Wet vocal quality Type of cueing: Verbal;Visual Amount of cueing: Maximal   GO     Donavan Burnet, MS [facility] SLP [phone]

## 2012-09-25 NOTE — Plan of Care (Signed)
Problem: Phase I Progression Outcomes Goal: OOB as tolerated unless otherwise ordered Outcome: Progressing oob with assist      

## 2012-09-26 ENCOUNTER — Encounter (HOSPITAL_COMMUNITY): Payer: Self-pay

## 2012-09-26 ENCOUNTER — Inpatient Hospital Stay (HOSPITAL_COMMUNITY): Payer: Medicare Other

## 2012-09-26 LAB — CULTURE, BAL-QUANTITATIVE W GRAM STAIN

## 2012-09-26 LAB — MAGNESIUM: Magnesium: 1.7 mg/dL (ref 1.5–2.5)

## 2012-09-26 LAB — GLUCOSE, CAPILLARY: Glucose-Capillary: 152 mg/dL — ABNORMAL HIGH (ref 70–99)

## 2012-09-26 LAB — APTT: aPTT: 35 seconds (ref 24–37)

## 2012-09-26 LAB — BASIC METABOLIC PANEL
CO2: 42 mEq/L (ref 19–32)
Calcium: 8.8 mg/dL (ref 8.4–10.5)
Creatinine, Ser: 0.64 mg/dL (ref 0.50–1.35)
GFR calc non Af Amer: >90 mL/min (ref >90)
Glucose, Bld: 134 mg/dL — ABNORMAL HIGH (ref 70–99)
Sodium: 138 mEq/L (ref 135–145)

## 2012-09-26 LAB — CBC
MCH: 30.9 pg (ref 26.0–34.0)
Platelets: 460 10*3/uL — ABNORMAL HIGH (ref 150–400)
RBC: 3.72 MIL/uL — ABNORMAL LOW (ref 4.22–5.81)
RDW: 13.9 % (ref 11.5–15.5)
WBC: 9.4 10*3/uL (ref 4.0–10.5)

## 2012-09-26 LAB — PROTIME-INR: Prothrombin Time: 15.5 seconds — ABNORMAL HIGH (ref 11.6–15.2)

## 2012-09-26 MED ORDER — IOHEXOL 300 MG/ML  SOLN
10.0000 mL | Freq: Once | INTRAMUSCULAR | Status: AC | PRN
  Administered 2012-09-26: 10 mL

## 2012-09-26 MED ORDER — FENTANYL CITRATE 0.05 MG/ML IJ SOLN
INTRAMUSCULAR | Status: AC | PRN
  Administered 2012-09-26: 100 ug via INTRAVENOUS

## 2012-09-26 MED ORDER — PREDNISONE 10 MG PO TABS
10.0000 mg | ORAL_TABLET | Freq: Every day | ORAL | Status: AC
  Administered 2012-09-27: 10 mg via ORAL
  Filled 2012-09-26 (×2): qty 1

## 2012-09-26 MED ORDER — BACITRACIN-NEOMYCIN-POLYMYXIN 400-5-5000 EX OINT
TOPICAL_OINTMENT | CUTANEOUS | Status: AC
  Administered 2012-09-26: 23:00:00
  Filled 2012-09-26: qty 1

## 2012-09-26 MED ORDER — VALPROIC ACID 250 MG/5ML PO SYRP
500.0000 mg | ORAL_SOLUTION | Freq: Two times a day (BID) | ORAL | Status: DC
  Administered 2012-09-26 – 2012-09-27 (×2): 500 mg
  Filled 2012-09-26 (×3): qty 10

## 2012-09-26 MED ORDER — POTASSIUM CHLORIDE 10 MEQ/100ML IV SOLN
10.0000 meq | INTRAVENOUS | Status: AC
  Administered 2012-09-26: 10 meq via INTRAVENOUS
  Filled 2012-09-26 (×2): qty 100

## 2012-09-26 MED ORDER — POTASSIUM CHLORIDE IN NACL 40-0.9 MEQ/L-% IV SOLN
INTRAVENOUS | Status: DC
  Administered 2012-09-26 – 2012-09-27 (×2): via INTRAVENOUS
  Filled 2012-09-26 (×3): qty 1000

## 2012-09-26 NOTE — Progress Notes (Signed)
Pt has refused Chest Vest for tonight.  RT to monitor and assess as needed.

## 2012-09-26 NOTE — Progress Notes (Signed)
[facility] PROGRESS NOTE  Joel Summers:[id number] DOB: 1931-01-02 DOA: 09/20/2012 PCP: Ron Parker, MD  Brief narrative: Joel Summers is an 79 y.o. male with a PMH of hypertension, dementia, COPD, diabetes who was admitted on 09/20/2012 with acute respiratory failure.  CXR revealed right upper lung lobe mass consistent with history of lung cancer.  No previous records of history of lung cancer or treatments. CT chest was significant for possible aspiration in addition to debris within the right-sided endobronchial tree with bibasilar opacities, right-sided pulmonary process which was primarily felt to be treatment/radiation related. No convincing evidence of dominant residual or recurrent primary tumor. PCCM consulted and he underwent bronchoscopy with lavage 09/25/2012. Per swallow evaluation, pt is at high risk of aspiration and is currently NPO, for PEG tube placement today.    Assessment/Plan: Principal Problem:  *Acute respiratory failure with hypoxia secondary to aspiration pneumonia  - Provided with supplemental oxygen support, now weaned to nasal cannula.  - Felt to be from aspiration pneumonia/post obstructive penumonia related to lung mass/lung cancer. - Continue vanco and cefepime per PCCM recommendations. Blood and sputum cultures negative to date. Legionella pneumonia and strep pneumonia antigen was negative. - Status post pulmonary consultation and bronchoscopy with lavage 09/24/2012 with no subsequent complications; results from brushings negative for malignancy.  - Continue albuterol and Atrovent nebulizers as needed every 2 hours.  - Continue prednisone 20 mg daily (taper quickly per PCCM).  - Continue aspiration precautions. COPD (chronic obstructive pulmonary disease)  - COPD order set in place; COPD gold alert ordered.  - Management as above with steroids (PO), nebulizer treatments PRN and antibiotics.  Active Problems:  Anemia of chronic disease  -  Hemoglobin stable with no current indication for transfusion. Hypokalemia  - Monitor and supplement as needed. Magnesium okay. Potassium added to IV fluids. Leukocytosis  - Likely due to steroids and pneumonia, resolved. Dementia with behavioral disturbances  - Continue aricept and namenda.  - Continue haldol 0.5 mg PO BID.  Lung mass  - Status post bronch with lavage 09/24/2012. Bronchial washings negative for malignancy. 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 CBGs 120-170. Diabetic neuropathy  - Continue gabapentin 100 mg PO TID.  HTN (hypertension)  - Continue Hctz and felodipine.  Severe protein calorie malnutrition  - NPO due to risk of aspiration.  - Status post swallow evaluation, patient felt to be at high risk of aspiration. - Peg tube to be placed today.  Code Status: DNR/DNI  Family Communication: No family at the bedside. Disposition Plan: To SNF when stable.    Medical Consultants:  Dr. Cyril Mourning, Pulmonology  IR  Other Consultants:  Dietitian  Speech therapy  Anti-infectives:  Vancomycin 09/20/2012---> 09/28/2012  Cefepime 09/20/2012---> 09/28/2012  Rocephin 09/20/2012---> 09/20/2012  Azithromycin 09/20/2012---> 09/20/2012  HPI/Subjective: Joel Summers is sitting up in a chair. He denies dyspnea and pain. He is not restless. No family is currently at the bedside.  Objective: Filed Vitals:   09/25/12 1400 09/25/12 2200 09/25/12 2208 09/26/12 0538  BP: 134/58 151/77  126/57  Pulse: 68  74 60  Temp: 97.1 F (36.2 C) 97.2 F (36.2 C)  97.8 F (36.6 C)  TempSrc: Axillary Axillary  Oral  Resp: 18 18  16   Height:      Weight:      SpO2: 91% 92%  96%    Intake/Output Summary (Last 24 hours) at 09/26/12 1056 Last data filed at 09/26/12 1017  Gross per 24 hour  Intake 755.16 ml  Output   1175 ml  Net -419.84 ml    Exam: Gen:  NAD Cardiovascular:  RRR, No M/R/G Respiratory:  Lungs  CTAB Gastrointestinal:  Abdomen soft, NT/ND, + BS Extremities:  No C/E/C  Data Reviewed: Basic Metabolic Panel:  Recent Labs Lab 09/20/12 1510 09/20/12 2010 09/21/12 0352 09/23/12 0354 09/24/12 0340 09/25/12 0325 09/26/12 0350  NA 137 138 136 137 136 136 138  K 3.9 3.6 3.9 3.0* 2.5* 3.1* 3.0*  CL 95* 96 97 96 94* 92* 93*  CO2 31 33* 32 36* 40* 41* 42*  GLUCOSE 207* 158* 183* 240* 196* 172* 134*  BUN 18 15 13 9  5* 9 10  CREATININE 0.83 0.72 0.59 0.58 0.55 0.60 0.64  CALCIUM 8.8 8.9 8.9 8.8 8.9 8.6 8.8  MG  --  1.8  --   --   --   --  1.7  PHOS  --  2.9  --   --   --   --   --    GFR Estimated Creatinine Clearance: 73.6 ml/min (by C-G formula based on Cr of 0.64). Liver Function Tests:  Recent Labs Lab 09/20/12 1510 09/20/12 2010 09/21/12 0352  AST 19 18 21   ALT 19 18 16   ALKPHOS 91 92 93  BILITOT 0.5 0.3 0.3  PROT 6.5 6.6 6.1  ALBUMIN 2.4* 2.4* 2.2*   Coagulation profile  Recent Labs Lab 09/20/12 2010 09/26/12 0350  INR 1.28 1.26    CBC:  Recent Labs Lab 09/20/12 0410 09/20/12 1510 09/20/12 2010 09/21/12 0352 09/23/12 0354 09/24/12 0340 09/25/12 0325 09/26/12 0350  WBC 14.5* 18.8* 20.9* 18.4* 15.2* 10.4 9.5 9.4  NEUTROABS 12.2* 15.8* 17.3*  --   --   --   --   --   HGB 11.7* 11.4* 11.5* 11.0* 11.7* 11.7* 11.0* 11.5*  HCT 33.7* 33.2* 33.7* 31.9* 34.3* 34.6* 32.0* 33.6*  MCV 89.2 89.5 90.1 89.9 90.5 90.1 89.9 90.3  PLT 410* 453* 433* 406* 462* 490* 454* 460*   CBG:  Recent Labs Lab 09/25/12 1215 09/25/12 1602 09/25/12 2021 09/26/12 0045 09/26/12 0532  GLUCAP 132* 170* 152* 136* 120*   Microbiology Recent Results (from the past 240 hour(s))  URINE CULTURE     Status: None   Collection Time    09/20/12  4:52 PM      Result Value Range Status   Specimen Description URINE, RANDOM   Final   Special Requests NONE   Final   Culture  Setup Time     Final   Value: 09/20/2012 22:14     Performed at [facility] Count      Final   Value: NO GROWTH     Performed at [facility]   Culture     Final   Value: NO GROWTH     Performed at [facility]   Report Status 09/21/2012 FINAL   Final  CULTURE, BLOOD (ROUTINE X 2)     Status: None   Collection Time    09/20/12  5:49 PM      Result Value Range Status   Specimen Description BLOOD RIGHT ARM   Final   Special Requests BOTTLES DRAWN AEROBIC AND ANAEROBIC   Final   Culture  Setup Time     Final   Value: 09/21/2012 03:01     Performed at [facility]   Culture     Final  Value:        BLOOD CULTURE RECEIVED NO GROWTH TO DATE CULTURE WILL BE HELD FOR 5 DAYS BEFORE ISSUING A FINAL NEGATIVE REPORT     Performed at [facility]   Report Status PENDING   Incomplete  CULTURE, BLOOD (ROUTINE X 2)     Status: None   Collection Time    09/20/12  5:58 PM      Result Value Range Status   Specimen Description BLOOD RIGHT HAND   Final   Special Requests BOTTLES DRAWN AEROBIC AND ANAEROBIC   Final   Culture  Setup Time     Final   Value: 09/21/2012 03:01     Performed at [facility]   Culture     Final   Value:        BLOOD CULTURE RECEIVED NO GROWTH TO DATE CULTURE WILL BE HELD FOR 5 DAYS BEFORE ISSUING A FINAL NEGATIVE REPORT     Performed at [facility]   Report Status PENDING   Incomplete  MRSA PCR SCREENING     Status: None   Collection Time    09/20/12 11:02 PM      Result Value Range Status   MRSA by PCR NEGATIVE  NEGATIVE Final   Comment:            The GeneXpert MRSA Assay (FDA     approved for NASAL specimens     only), is one component of a     comprehensive MRSA colonization     surveillance program. It is not     intended to diagnose MRSA     infection nor to guide or     monitor treatment for     MRSA infections.  AFB CULTURE WITH SMEAR     Status: None   Collection Time    09/24/12  8:35 AM      Result Value Range Status   Specimen Description BRONCHIAL ALVEOLAR LAVAGE   Final    Special Requests NONE   Final   ACID FAST SMEAR     Final   Value: NO ACID FAST BACILLI SEEN     Performed at [facility]   Culture     Final   Value: CULTURE WILL BE EXAMINED FOR 6 WEEKS BEFORE ISSUING A FINAL REPORT     Performed at [facility]   Report Status PENDING   Incomplete  CULTURE, BAL-QUANTITATIVE     Status: None   Collection Time    09/24/12  8:35 AM      Result Value Range Status   Specimen Description BRONCHIAL ALVEOLAR LAVAGE   Final   Special Requests NONE   Final   Gram Stain     Final   Value: ABUNDANT WBC PRESENT,BOTH PMN AND MONONUCLEAR     NO SQUAMOUS EPITHELIAL CELLS SEEN     NO ORGANISMS SEEN     Performed at [facility] Count     Final   Value: 45,000 COLONIES/ML     Performed at [facility]   Culture     Final   Value: Non-Pathogenic Oropharyngeal-type Flora Isolated.     Performed at [facility]   Report Status 09/26/2012 FINAL   Final  FUNGUS CULTURE W SMEAR     Status: None   Collection Time    09/24/12  8:35 AM      Result Value Range Status   Specimen Description BRONCHIAL  ALVEOLAR LAVAGE   Final   Special Requests NONE   Final   Fungal Smear     Final   Value: NO YEAST OR FUNGAL ELEMENTS SEEN     Performed at [facility]   Culture     Final   Value: CULTURE IN PROGRESS FOR FOUR WEEKS     Performed at [facility]   Report Status PENDING   Incomplete  URINE CULTURE     Status: None   Collection Time    09/24/12  3:08 PM      Result Value Range Status   Specimen Description URINE, RANDOM   Final   Special Requests NONE   Final   Culture  Setup Time     Final   Value: 09/24/2012 23:08     Performed at [facility] Count     Final   Value: NO GROWTH     Performed at [facility]   Culture     Final   Value: NO GROWTH     Performed at [facility]   Report Status 09/25/2012 FINAL   Final     Procedures and Diagnostic Studies: Dg  Chest 2 View  09/20/2012   *RADIOLOGY REPORT*  Clinical Data: Fall, weakness, unable to walk well, the kidney, cough, history lung cancer, hypertension, COPD  CHEST - 2 VIEW  Comparison: Exam at 1603 hours compared to earlier study of 0425 hours  Findings: Mild volume loss in right hemithorax again noted. Enlargement of cardiac silhouette with tortuous aorta. Pulmonary vascular congestion. Right perihilar opacity compatible with tumor and post-treatment changes. Bibasilar atelectasis versus infiltrates with small right pleural effusion. No pneumothorax. Bones demineralized.  IMPRESSION: Right perihilar opacity consistent with lung cancer and post- therapy changes. COPD changes with bibasilar atelectasis versus infiltrates and small right pleural effusion.   Original Report Authenticated By: Ulyses Southward, M.D.   Dg Chest 2 View  09/20/2012   *RADIOLOGY REPORT*  Clinical Data: Status post fall.  Chest pain.  History of lung cancer.  CHEST - 2 VIEW  Comparison: None.  Findings: There is volume loss in the right chest with a mass-like opacity in the right upper lobe.  Blunting of the right costophrenic angle could be due to a small effusion or scar. Surgical clips right hilum noted.  The left lung appears clear. Heart size is normal.  IMPRESSION: Post-treatment change right chest with a mass-like opacity right upper lobe compatible with history of lung cancer.  Small right pleural effusion.   Original Report Authenticated By: Holley Dexter, M.D.   Ct Head Wo Contrast  09/20/2012   *RADIOLOGY REPORT*  Clinical Data:  Status post fall with a blow to the head.  CT HEAD WITHOUT CONTRAST CT CERVICAL SPINE WITHOUT CONTRAST  Technique:  Multidetector CT imaging of the head and cervical spine was performed following the standard protocol without intravenous contrast.  Multiplanar CT image reconstructions of the cervical spine were also generated.  Comparison:  None.  CT HEAD  Findings: The brain is atrophic with chronic  microvascular ischemic change.  No acute intracranial abnormality including infarction, hemorrhage, mass lesion, mass effect, midline shift or abnormal extra-axial fluid collection is identified.  Hematoma over the frontal bone is noted.  No fracture is seen.  IMPRESSION:  1.  Hematoma over the frontal bone without underlying fracture or for acute intracranial abnormality. 2.  Atrophy and chronic microvascular ischemic change.  CT CERVICAL SPINE  Findings: No  fracture or subluxation of the cervical spine is identified. Loss of disc space height and endplate spurring are most notable at C5-6 and C6-7.  Multilevel facet degenerative change is worse on the left.  Paraspinous soft tissue structures are unremarkable.  Apical scarring on the right is noted.  IMPRESSION: No acute finding.  Degenerative disc disease C5-6 and C6-7.   Original Report Authenticated By: Holley Dexter, M.D.   Ct Chest W Contrast  09/21/2012   *RADIOLOGY REPORT*  Clinical Data: Evaluate right upper lobe lung mass. History lung cancer.  Elevated white blood cells.  Acute respiratory distress. History of dementia.  CT CHEST WITH CONTRAST  Technique:  Multidetector CT imaging of the chest was performed following the standard protocol during bolus administration of intravenous contrast.  Contrast: 80mL OMNIPAQUE IOHEXOL 300 MG/ML  SOLN  Comparison: Plain film of 09/20/2012.  No prior CT.  Findings: Lungs/pleura: Mild motion degradation.  Material in the right mainstem bronchus on image 38/series 6 and lower lobe bronchi on image 43/series 6.  Right greater than left patchy bibasilar airspace disease.  Right suprahilar confluent pulmonary opacity with bronchiectasis.  This is identified lateral and anterior to surgical sutures, including on image 39/series 6.  Trace left pleural fluid.  Small right-sided pleural effusion with minimal wall thickening or loculation anteriorly.  Surgical sutures about right lower lobe bronchi.  Heart/Mediastinum:  Mild cardiomegaly.  Coronary artery atherosclerosis.  Main pulmonary artery enlargement. No central pulmonary embolism, on this non-dedicated study.  Mediastinal deviation to the right with volume loss in the right hemithorax.  Right paratracheal node 1.2 cm on image 28. Subcarinal node 1.7 cm on image 41.  Upper abdomen: Fatty atrophy of the pancreas.  Normal imaged adrenal glands.  Bones/Musculoskeletal:  Postsurgical or post-traumatic defects of right-sided ribs.  IMPRESSION:  1.  Findings which are highly suspicious for aspiration.  Debris within the right-sided endobronchial tree with bibasilar, right greater than left patchy airspace opacities. 2.  Right-sided pulmonary process which is primarily felt to be treatment/radiation related.  No convincing evidence of dominant residual or recurrent primary tumor.  CT follow-up and consideration of eventual PET may be informative.  In addition, comparison with prior exams would be useful. 3.  Thoracic adenopathy, for which which metastatic disease is suspected.  Alternatively, these nodes could be reactive. 4.  Right greater than left bilateral pleural effusions. 5. Pulmonary artery enlargement suggests pulmonary arterial hypertension.   Original Report Authenticated By: Jeronimo Greaves, M.D.   Ct Cervical Spine Wo Contrast  09/20/2012   *RADIOLOGY REPORT*  Clinical Data:  Status post fall with a blow to the head.  CT HEAD WITHOUT CONTRAST CT CERVICAL SPINE WITHOUT CONTRAST  Technique:  Multidetector CT imaging of the head and cervical spine was performed following the standard protocol without intravenous contrast.  Multiplanar CT image reconstructions of the cervical spine were also generated.  Comparison:  None.  CT HEAD  Findings: The brain is atrophic with chronic microvascular ischemic change.  No acute intracranial abnormality including infarction, hemorrhage, mass lesion, mass effect, midline shift or abnormal extra-axial fluid collection is identified.   Hematoma over the frontal bone is noted.  No fracture is seen.  IMPRESSION:  1.  Hematoma over the frontal bone without underlying fracture or for acute intracranial abnormality. 2.  Atrophy and chronic microvascular ischemic change.  CT CERVICAL SPINE  Findings: No fracture or subluxation of the cervical spine is identified. Loss of disc space height and endplate spurring are most notable at  C5-6 and C6-7.  Multilevel facet degenerative change is worse on the left.  Paraspinous soft tissue structures are unremarkable.  Apical scarring on the right is noted.  IMPRESSION: No acute finding.  Degenerative disc disease C5-6 and C6-7.   Original Report Authenticated By: Holley Dexter, M.D.   Dg Chest Port 1 View  09/24/2012   *RADIOLOGY REPORT*  Clinical Data: Post bronchoscopy  PORTABLE CHEST - 1 VIEW  Comparison: Study obtained earlier in the day.  Findings:  No pneumothorax.  There is scarring in the right mid lung with retraction and volume loss.  There is apical pleural thickening on the right.  There is scarring in the left base as well.  There is no new opacity.  Heart size and pulmonary vascularity are within normal limits given the scarring and retraction on the right.  There is underlying emphysema.  IMPRESSION: No appreciable change compared to study obtained earlier in the day.  No pneumothorax.   Original Report Authenticated By: Bretta Bang, M.D.   Dg Chest Port 1 View  09/24/2012   *RADIOLOGY REPORT*  Clinical Data: Follow up airspace disease  PORTABLE CHEST - 1 VIEW  Comparison: 09/20/2012  Findings: Right perihilar density unchanged.  Right pleural effusion/pleural scarring unchanged.  There is right lower lobe collapse which has developed in the interval.  This may be due to endobronchial plugging from mucus or aspiration.  Mild left lower lobe infiltrate unchanged.  No effusion on the left.  Underlying COPD.  IMPRESSION: Right lower lobe collapse has occurred in the interval.  There is a  small right effusion/pleural scarring.  Mild left lower lobe infiltrate is unchanged.   Original Report Authenticated By: Janeece Riggers, M.D.   Dg Swallowing Func-speech Pathology  09/23/2012   Chales Abrahams, CCC-SLP     09/23/2012  3:21 PM Objective Swallowing Evaluation: Modified Barium Swallowing Study   Patient Details  Name: JOHNN KRASOWSKI MRN: [id number] Date of Birth: [birth year]  Today's Date: 09/23/2012 Time: 2841-3244 SLP Time Calculation (min): 30 min  Past Medical History:  Past Medical History  Diagnosis Date  . Altered mental status   . COPD (chronic obstructive pulmonary disease)   . Diabetes mellitus without complication   . Hypertension   . Cancer   . Lung cancer    Past Surgical History: History reviewed. No pertinent past  surgical history. HPI:  79 yo resident of ALF [facility])  admitted to [facility]  after falling-pt found to have pna.  PMH + for HTN, COPD,  dementia.  Pt CT head showed hematoma over frontal bone, atrophy.   Pt with possible debris in right lung concerning for aspiration.   Pt denies dysphagia but given his dementia, uncertain of ability  to recall information.  BSE completed with indications for MBS  and order was obtained.       Assessment / Plan / Recommendation Clinical Impression  Dysphagia Diagnosis: Moderate oral phase dysphagia;Moderate  pharyngeal phase dysphagia (suspect esophageal component as well)   Clinical impression: Pt presents with moderate oropharyngeal  sensorimotor dysphagia with decreased oral bolus containment from  weakness resulting in premature spillage of barium into pharynx  and oral residuals (mostly with liquids) without pt sensation.   Pt's pharyngeal swallow characterized by delayed initiation  (cracker accumulating in vallecular space for 4 seconds prior to  swallow) and liquids spilled to pyriform sinus prior to swallow.   Decreased tongue base retraction, laryngeal elevation, pharyngeal  contraction result in pharyngeal stasis  with  liquids and barium  tablet WITHOUT awareness and laryngeal penetration of both  nectar/thin consistencies.  Chin tuck not helpful and cued cough  was WEAK and did not clear penetrates.  Barium tablet given with  pudding precariously lodged at vallecular space without pt  awareness, thin aided clearance but with penetration.   Given pt's increased dyspenia with effort, delayed swallow  responses, no awareness to stasis and weak nonprotective cough,  he is deemed a high aspiration risk with po.  Suspect pt is  aspirating secretions as barium mixed with secretions from oral  cavity and were penetrated.  Skilled intervention included  educating pt to findings via video monitor, providing visual and  verbal feedback to compensation strategies.    Options may be to allow po with known risk and strategies to  mitigate risk or keep npo except medicine crushed with applesauce  and ice prn after oral care.   Would need repeat MBS after  medical/respiratory improvement due to sensory motor deficits.   Unfortunately pt coughed throughout entire MBS making clinical  evaluation inaccurate and pt's cough is weak and nonprotective.    SLP phoned ALF after MBS and spoke to [place], resident care  Production designer, theatre/television/film, who reported pt was on a regular/thin diet and took  medications with water with good tolerance as far as she knew.   Grover Canavan stated pt did not have significant weight loss nor  pulmonary infections recently as far as she knew.         Treatment Recommendation  Therapy as outlined in treatment plan below    Diet Recommendation NPO;Ice chips PRN after oral care (vs po of  pureed/thin with risks accepted)   Medication Administration: Crushed with puree    Other  Recommendations Recommended Consults: MBS Oral Care Recommendations: Oral care BID   Follow Up Recommendations  Skilled Nursing facility    Frequency and Duration min 2x/week  2 weeks   Pertinent Vitals/Pain Afebrile, nonproductive congested cough, pt  unable to clear,  occasional wet voice-? Asp of secretions    SLP Swallow Goals Patient will utilize recommended strategies during swallow to  increase swallowing safety with: Total assistance Goal #3: Pt and MD will determine if desire for pt to eat with  known aspiration risk and strategies to mitigate it.     General HPI: 79 yo resident of ALF [facility])   admitted to [facility] after falling-pt found to have pna.  PMH + for  HTN, COPD, dementia.  Pt CT head showed hematoma over frontal  bone, atrophy.  Pt with possible debris in right lung concerning  for aspiration.  Pt denies dysphagia but given his dementia,  uncertain of ability to recall information.  BSE completed with  indications for MBS and order was obtained.   Type of Study: Modified Barium Swallowing Study Reason for Referral: Objectively evaluate swallowing function Previous Swallow Assessment: none in epic Diet Prior to this Study: NPO Temperature Spikes Noted: No Respiratory Status: Supplemental O2 delivered via (comment) History of Recent Intubation: No Behavior/Cognition:  Alert;Cooperative;Confused;Impulsive;Distractible;Decreased  sustained attention Oral Cavity - Dentition: Edentulous Oral Motor / Sensory Function: Impaired - see Bedside swallow  eval Self-Feeding Abilities: Needs assist Patient Positioning: Upright in chair Baseline Vocal Quality: Low vocal intensity Volitional Cough: Weak;Congested Volitional Swallow: Able to elicit Anatomy: Within functional limits Pharyngeal Secretions: Standing secretions in (comment)    Reason for Referral Objectively evaluate swallowing function   Oral Phase Oral Preparation/Oral Phase Oral Phase: Impaired Oral - Nectar Oral -  Nectar Teaspoon: Reduced posterior propulsion;Piecemeal  swallowing;Delayed oral transit;Weak lingual manipulation Oral - Nectar Cup: Reduced posterior propulsion;Piecemeal  swallowing;Delayed oral transit;Weak lingual manipulation Oral - Thin Oral - Thin Teaspoon: Delayed oral  transit;Piecemeal  swallowing;Weak lingual manipulation Oral - Thin Cup: Piecemeal swallowing;Reduced posterior  propulsion;Weak lingual manipulation;Lingual/palatal residue Oral - Thin Straw: Piecemeal swallowing;Reduced posterior  propulsion;Weak lingual manipulation;Lingual/palatal residue Oral - Solids Oral - Puree: Reduced posterior propulsion;Delayed oral  transit;Weak lingual manipulation Oral - Regular: Delayed oral transit;Impaired mastication;Reduced  posterior propulsion;Lingual/palatal residue;Weak lingual  manipulation Oral - Pill: Reduced posterior propulsion;Delayed oral  transit;Weak lingual manipulation Oral Phase - Comment Oral Phase - Comment: oral residuals prematurely spill into  pharynx without pt awareness- at times pt required cues to  swallow and response was delayed   Pharyngeal Phase Pharyngeal Phase Pharyngeal Phase: Impaired Pharyngeal - Nectar Pharyngeal - Nectar Teaspoon: Delayed swallow  initiation;Pharyngeal residue - valleculae;Reduced laryngeal  elevation;Reduced airway/laryngeal closure;Reduced tongue base  retraction;Reduced pharyngeal peristalsis Pharyngeal - Nectar Cup: Delayed swallow initiation;Premature  spillage to pyriform sinuses;Reduced airway/laryngeal  closure;Reduced laryngeal elevation;Reduced tongue base  retraction;Pharyngeal residue - valleculae;Pharyngeal residue -  pyriform sinuses Pharyngeal - Thin Pharyngeal - Thin Teaspoon: Premature spillage to pyriform  sinuses;Reduced airway/laryngeal closure;Reduced laryngeal  elevation;Delayed swallow initiation;Reduced pharyngeal  peristalsis;Pharyngeal residue - valleculae (swallow triggered at  approx 2 seconds at vallecular space) Pharyngeal - Thin Cup: Premature spillage to valleculae;Delayed  swallow initiation;Premature spillage to pyriform  sinuses;Penetration/Aspiration during swallow;Reduced laryngeal  elevation;Reduced airway/laryngeal closure;Reduced tongue base  retraction;Reduced pharyngeal  peristalsis;Pharyngeal residue -  valleculae Penetration/Aspiration details (thin cup): Material enters  airway, remains ABOVE vocal cords and not ejected out Pharyngeal - Thin Straw: Delayed swallow initiation;Premature  spillage to pyriform sinuses;Penetration/Aspiration during  swallow;Penetration/Aspiration after swallow;Reduced  airway/laryngeal closure;Reduced laryngeal elevation;Reduced  pharyngeal peristalsis Penetration/Aspiration details (thin straw): Material enters  airway, remains ABOVE vocal cords and not ejected out Pharyngeal - Solids Pharyngeal - Puree: Delayed swallow initiation;Premature spillage  to valleculae (trace base of tongue stasis without awarness) Pharyngeal - Regular: Delayed swallow initiation;Premature  spillage to valleculae (delay up to 4 seconds filling vallecular  space) Pharyngeal - Pill: Delayed swallow initiation;Pharyngeal residue  - valleculae;Premature spillage to valleculae;Reduced tongue base  retraction;Reduced laryngeal elevation;Reduced airway/laryngeal  closure (pt did not sense barium tablet lodged in pharynx) Pharyngeal Phase - Comment Pharyngeal Comment: pt with delayed pharyngeal swallow initiation  and no awareness to tongue base and vallecular stasis, cued dry  swallows helpful but pt was delayed in performance and would  likely be taxing for pt with COPD, cued cough did not fully clear  trace penetration, chin tuck did not prevent penetration of thin  was difficult for pt to perform, nectar liquids resulted in more  stasis than thin, pharyngeal swallow strong with solid and puree  without stasis   Cervical Esophageal Phase    GO    Cervical Esophageal Phase Cervical Esophageal Phase: Impaired Cervical Esophageal Phase - Comment Cervical Esophageal Comment: After pt consumed a very small  amount of nectar barium *one cup sip and one tsp, pt appeared  with significant distal stasis without awareness that did not  clear, swallowing water boluses aided clearance;  further boluses  of thin resulted in distal stasis - again requiring water bolus  to clear.  Pt appeared to clear puree, cracker and barium tablet  in timely fashion.  Would recommend strict esophageal/reflux  precautions for this pt.         Donavan Burnet, MS [facility] SLP [phone]  Scheduled Meds: . acidophilus  1 capsule Oral Daily  . aspirin  81 mg Oral Daily  . bisacodyl  10 mg Rectal Daily  . ceFEPime (MAXIPIME) IV  1 g Intravenous Q8H  . citalopram  20 mg Oral Daily  . divalproex  500 mg Oral BID  . donepezil  10 mg Oral QHS  . felodipine  5 mg Oral Daily  . gabapentin  100 mg Oral TID  . haloperidol  0.5 mg Oral BID  . hydrochlorothiazide  25 mg Oral Daily  . memantine  5 mg Oral BID  . metFORMIN  500 mg Oral Q breakfast  . [START ON 09/27/2012] predniSONE  10 mg Oral Q breakfast  . vancomycin  1,000 mg Intravenous Q12H   Continuous Infusions: . 0.9 % NaCl with KCl 40 mEq / L 75 mL/hr at 09/26/12 0826  . dextrose 5 % and 0.9% NaCl 100 mL/hr at 09/23/12 1937    Time spent: 25 minutes.   LOS: 6 days   Tyrome Donatelli  [facility] Pager [phone].   *Please note that the hospitalists switch teams on Wednesdays. Please call the flow manager at [phone] if you are having difficulty reaching the hospitalist taking care of this patient as she can update you and provide the most up-to-date pager number of provider caring for the patient. If 8PM-8AM, please contact night-coverage at [email or web address], password [facility]  09/26/2012, 10:56 AM

## 2012-09-26 NOTE — Progress Notes (Signed)
NUTRITION FOLLOW UP  Intervention:   - Once PEG placed and ready for use, will order TF of Jevity 1.2 start at 94ml/hr increase by 10ml every 4 hours goal of 38ml/hr - this will provide 1872 calories, 87g protein, free water. If IVF d/c, recommend water flushes 4 times/day. - Will order adult enteral protocol once PEG placed - Will continue to monitor   Nutrition Dx:   Inadequate oral intake related to inability to eat as evidenced by npo status - ongoing  Goal:   Patient to meet >90% estimated needs with oral vs enteral nutrition - not met   Monitor:   Weights, labs, TF initiation and tolerance  Assessment:   Pt discussed during multidisciplinary rounds.   S/p clinical/bedside swallow evaluation 8/11 that showed pt with severe aspiration risk. S/p MBS 8/11 with recommendations for NPO. Plan is for PEG tube placement today. Per SLP's discussion with staff at pt's facility, pt did not have any weight loss PTA. Noted pt's weight up 4 pounds since admission, and pt with RLE, LLE edema. Noted pt with low potassium, getting IV KCl. Per conversation with MD, RD able to manage TF once PEG placed and ready for use. Per discussion with RN, once PEG placed today, pt will need to wait 24 hours before it can be used.   Noted MD added severe malnutrition to problem list however per conversation with facility, pt has had stable weight, and per nutrition focused physical exam, pt only with some areas of mild/moderate muscle wasting.   Nutrition Focused Physical Exam:  Subcutaneous Fat:  Orbital Region: mild/moderate wasting Upper Arm Region: mild/moderate wasting Thoracic and Lumbar Region: WNL  Muscle:  Temple Region: mild/moderate wasting Clavicle Bone Region: WNL Clavicle and Acromion Bone Region: WNL Scapular Bone Region: WNL Dorsal Hand: WNL Patellar Region: NA Anterior Thigh Region: NA Posterior Calf Region: NA  Edema: RLE, LLE edema   Height: Ht Readings from Last 1  Encounters:  09/20/12 5\' 9"  (1.753 m)    Weight Status:   Wt Readings from Last 1 Encounters:  09/25/12 166 lb 7.2 oz (75.5 kg)    Re-estimated needs:  Kcal: 1900-2200 Protein: 80-90g Fluid: > 2 L/day  Skin: WNL  Diet Order: NPO   Intake/Output Summary (Last 24 hours) at 09/26/12 0929 Last data filed at 09/26/12 0604  Gross per 24 hour  Intake 755.16 ml  Output    875 ml  Net -119.84 ml    Last BM: 8/13   Labs:   Recent Labs Lab 09/20/12 1510 09/20/12 2010  09/24/12 0340 09/25/12 0325 09/26/12 0350  NA 137 138  < > 136 136 138  K 3.9 3.6  < > 2.5* 3.1* 3.0*  CL 95* 96  < > 94* 92* 93*  CO2 31 33*  < > 40* 41* 42*  BUN 18 15  < > 5* 9 10  CREATININE 0.83 0.72  < > 0.55 0.60 0.64  CALCIUM 8.8 8.9  < > 8.9 8.6 8.8  MG  --  1.8  --   --   --  1.7  PHOS  --  2.9  --   --   --   --   GLUCOSE 207* 158*  < > 196* 172* 134*  < > = values in this interval not displayed.  CBG (last 3)   Recent Labs  09/25/12 2021 09/26/12 0045 09/26/12 0532  GLUCAP 152* 136* 120*    Scheduled Meds: . acidophilus  1 capsule  Oral Daily  . aspirin  81 mg Oral Daily  . bisacodyl  10 mg Rectal Daily  . ceFEPime (MAXIPIME) IV  1 g Intravenous Q8H  . citalopram  20 mg Oral Daily  . divalproex  500 mg Oral BID  . donepezil  10 mg Oral QHS  . felodipine  5 mg Oral Daily  . gabapentin  100 mg Oral TID  . haloperidol  0.5 mg Oral BID  . hydrochlorothiazide  25 mg Oral Daily  . memantine  5 mg Oral BID  . metFORMIN  500 mg Oral Q breakfast  . predniSONE  20 mg Oral Q breakfast  . vancomycin  1,000 mg Intravenous Q12H    Continuous Infusions: . 0.9 % NaCl with KCl 40 mEq / L 75 mL/hr at 09/26/12 0826  . dextrose 5 % and 0.9% NaCl 100 mL/hr at 09/23/12 1478     Levon Hedger MS, RD, LDN [phone] Pager [phone] After Hours Pager

## 2012-09-26 NOTE — Progress Notes (Signed)
PT Cancellation Note  Patient Details Name: Joel Summers MRN: [id number] DOB: [birth year]   Cancelled Treatment:    Reason Eval/Treat Not Completed: Other (comment) (Palliative care meeting  In AM. will await  GOC decision.)   Rada Hay 09/26/2012, 1:17 PM

## 2012-09-26 NOTE — Progress Notes (Signed)
CRITICAL VALUE ALERT  Critical value received:  CO2 42, K+ 3.0  Date of notification:  09/26/12  Time of notification:  0449  Critical value read back:yes  Nurse who received alert: Greig Right    MD notified (1st page):  Craige Cotta  Time of first page:  0521  MD notified (2nd page):  Time of second page:  Responding MD:  Craige Cotta  Time MD responded:  0541--2 KCL runs ordered

## 2012-09-26 NOTE — Progress Notes (Signed)
ANTIBIOTIC CONSULT NOTE - Follow Up  Pharmacy Consult for:  Vancomycin Indication:  Suspected pneumonia (HCAP vs aspiration PNA)  Allergies  Allergen Reactions  . Ativan [Lorazepam]    Patient Measurements: Height: 5\' 9"  (175.3 cm) Weight: 166 lb 7.2 oz (75.5 kg) IBW/kg (Calculated) : 70.7  Vital Signs: Temp: 97.8 F (36.6 C) (08/14 0538) Temp src: Oral (08/14 0538) BP: 126/57 mmHg (08/14 0538) Pulse Rate: 60 (08/14 0538)  Labs:  Recent Labs  09/24/12 0340 09/25/12 0325 09/26/12 0350  WBC 10.4 9.5 9.4  HGB 11.7* 11.0* 11.5*  PLT 490* 454* 460*  CREATININE 0.55 0.60 0.64   Estimated Creatinine Clearance: 73.6 ml/min (by C-G formula based on Cr of 0.64).   Medications:  Scheduled:  . acidophilus  1 capsule Oral Daily  . aspirin  81 mg Oral Daily  . bisacodyl  10 mg Rectal Daily  . ceFEPime (MAXIPIME) IV  1 g Intravenous Q8H  . citalopram  20 mg Oral Daily  . divalproex  500 mg Oral BID  . donepezil  10 mg Oral QHS  . felodipine  5 mg Oral Daily  . gabapentin  100 mg Oral TID  . haloperidol  0.5 mg Oral BID  . hydrochlorothiazide  25 mg Oral Daily  . memantine  5 mg Oral BID  . metFORMIN  500 mg Oral Q breakfast  . predniSONE  20 mg Oral Q breakfast  . vancomycin  1,000 mg Intravenous Q12H   Assessment:  D7/8 Vancomycin/Cefepime for HCAP vs aspiration PNA  Leukocytosis resolved, afebrile, renal function stable, Vanc trough 8/11 in range  Last day abx is 8/16  Goal of Therapy:  Vancomycin trough levels 15-20 mcg/ml Eradication of infection  Plan:   Continue Vancomycin 1gm q12, Cefepime 1gm q8hr  Otho Bellows PharmD Pager [phone] 09/26/2012, 8:24 AM

## 2012-09-26 NOTE — Progress Notes (Addendum)
Thank you for consulting the Palliative Medicine Team at [facility] to meet your patient's and family's needs.  The reason that you asked [place] to see your patient is for a GOC - Goals of Care We have scheduled your patient for a meeting Friday 8/15   @   8am ML  The Surrogate decision maker is Insurance account manager.  Their representative SW [name] will meet for GOC. Other family members that MAY be present include: Daughter - although DTR IS NOT A DECISION MAKER. Your patient is unable to participate. Additional Narrative: adm w/Acute resp failure w/hypoxia, SOB, Cough.  PEG placed 8/13.  Chalmers Cater RN,  PMT RN Liaison Call PMT phone [phone]

## 2012-09-26 NOTE — Procedures (Signed)
Successful placement of 20 French gastrostomy tube.  No immediate complication.    

## 2012-09-26 NOTE — Progress Notes (Signed)
CSW reviewed chart and noted that plan is for PMT GOC tomorrow morning at 8 am.  CSW also noted that PEG tube is being placed today.  CSW contacted pt legal guardian, Joel Summers re: pt care plan and disposition plan.  CSW acknowledged that PMT GOC meeting will be held tomorrow and this will assist legal guardian with making decisions about further care for pt, but discussed that given that PEG tube is being placed that CSW wanted to provide SNF bed offers because if pt maintains PEG placement then SNF placement will be required.  CSW provided SNF bed offers and clarified pt legal guardian questions.   CSW to follow up following PMT GOC meeting tomorrow morning and assist with discharge planning needs.  Jacklynn Lewis, MSW, LCSWA  Clinical Social Work [phone]

## 2012-09-26 NOTE — Progress Notes (Signed)
PULMONARY  / CRITICAL CARE MEDICINE  Name: Joel Summers MRN: [id number] DOB: 29-Dec-1930    ADMISSION DATE:  09/20/2012 CONSULTATION DATE:  09/22/12  REFERRING MD :  Elisabeth Pigeon PRIMARY SERVICE:  [facility]  CHIEF COMPLAINT:  Eval abn CXR  BRIEF PATIENT DESCRIPTION:  79 y/o WM from nursing home, adm for SOB, cough, and abn CXR w/ extensive right sided changes from hx lung cancer & treatment (no details avail in Epic).  ?Aspiration raised w/ debris seen in right airways on CT Chest.  SIGNIFICANT EVENTS / STUDIES:  8/12 RLL atelectasis 8/12 bscopy showed RLL endobronchial mass -neg brushing  LINES / TUBES:   CULTURES: 8/8 Blood x2>>ng 8/8 Urine>>neg  ANTIBIOTICS: Cefepime 8/8>>  Vancomycin 8/8>>8/11   SUBJECTIVE: Confused -has sitter afebrile oob to chair  VITAL SIGNS: Temp:  [97.1 F (36.2 C)-97.8 F (36.6 C)] 97.8 F (36.6 C) (08/14 0538) Pulse Rate:  [60-74] 60 (08/14 0538) Resp:  [16-18] 16 (08/14 0538) BP: (126-151)/(57-77) 126/57 mmHg (08/14 0538) SpO2:  [91 %-96 %] 96 % (08/14 0538)  PHYSICAL EXAMINATION: Constitutional: Elderly, appears chr ill. No distress.  HENT: Normocephalic. Dry mucus membranes.  Eyes: Conjunctivae and EOM are normal. PERRLA, no scleral icterus.  Neck:  No JVD. No tracheal deviation. No thyromegaly.  CVS: RRR, Gr1/6 SEM at lsb w/o rubs or gallops detected Lungs: decr BS on right, no rhonchi Abdominal: Soft & nontender w/o guarding or rebound Musculoskeletal: DJD- sl decr ROM. No edema and no tenderness.  Lymphadenopathy: No lymphadenopathy noted, cervical, inguinal. Neuro: Alert. Not oriented, no focal neurologic deficits  Skin: Skin is warm and dry.    Recent Labs Lab 09/24/12 0340 09/25/12 0325 09/26/12 0350  NA 136 136 138  K 2.5* 3.1* 3.0*  CL 94* 92* 93*  CO2 40* 41* 42*  BUN 5* 9 10  CREATININE 0.55 0.60 0.64  GLUCOSE 196* 172* 134*    Recent Labs Lab 09/24/12 0340 09/25/12 0325 09/26/12 0350  HGB 11.7* 11.0* 11.5*   HCT 34.6* 32.0* 33.6*  WBC 10.4 9.5 9.4  PLT 490* 454* 460*            ASSESSMENT / PLAN:  1)  COPD - records indicate he was on Proventil 2mg Tid, ProventilHFA prn, Robitussin, Pred20mg  & Augmentin/ Levaquin recently in the NH 2)  Hx Lung Cancer w/ surg & ?XRT- we do not have any records or details & this would obviously be of signif value in his current assessment, Endobronchial lesion RLL s/o recurrence even though brushing neg 3)  Poss aspiration w/ debris in right sided airways - he is not in distress & not coughing at present 4) RLL atelectasis  Plan: - given his advanced dementia,  he is NOT a candidate for chemo / XRT - agree w/ NEBS, chest vest bid  - agree w/ empiric Maxipime, can dc Vanco  - wean Pred rapidly (looks like it was started in the NH), reduced 8/11 to 40 QD -Bronchoscopy findings & likely aspiration were discussed with HCPOA. Decision made to issue DNR order. Even though brushing negative, this may be recurrent cancer. Would not pursue any more biopsies since no treatment to offer.  PCCM to sign off , On going palliative care discussion appropriate   Cyril Mourning MD. FCCP. [facility] Pager [phone] If no response call [phone]    ALVA,RAKESH V.

## 2012-09-27 DIAGNOSIS — R222 Localized swelling, mass and lump, trunk: Secondary | ICD-10-CM

## 2012-09-27 DIAGNOSIS — J189 Pneumonia, unspecified organism: Secondary | ICD-10-CM

## 2012-09-27 DIAGNOSIS — E1149 Type 2 diabetes mellitus with other diabetic neurological complication: Secondary | ICD-10-CM

## 2012-09-27 DIAGNOSIS — E1142 Type 2 diabetes mellitus with diabetic polyneuropathy: Secondary | ICD-10-CM

## 2012-09-27 DIAGNOSIS — J9600 Acute respiratory failure, unspecified whether with hypoxia or hypercapnia: Secondary | ICD-10-CM

## 2012-09-27 DIAGNOSIS — E43 Unspecified severe protein-calorie malnutrition: Secondary | ICD-10-CM

## 2012-09-27 DIAGNOSIS — Z515 Encounter for palliative care: Secondary | ICD-10-CM

## 2012-09-27 LAB — GLUCOSE, CAPILLARY
Glucose-Capillary: 109 mg/dL — ABNORMAL HIGH (ref 70–99)
Glucose-Capillary: 116 mg/dL — ABNORMAL HIGH (ref 70–99)
Glucose-Capillary: 156 mg/dL — ABNORMAL HIGH (ref 70–99)

## 2012-09-27 LAB — BASIC METABOLIC PANEL
BUN: 8 mg/dL (ref 6–23)
Calcium: 8.6 mg/dL (ref 8.4–10.5)
GFR calc non Af Amer: >90 mL/min (ref >90)
Glucose, Bld: 120 mg/dL — ABNORMAL HIGH (ref 70–99)

## 2012-09-27 LAB — CULTURE, BLOOD (ROUTINE X 2)
Culture: NO GROWTH
Culture: NO GROWTH

## 2012-09-27 LAB — VANCOMYCIN, TROUGH: Vancomycin Tr: 15 ug/mL (ref 10.0–20.0)

## 2012-09-27 MED ORDER — HYDROCODONE-ACETAMINOPHEN 5-325 MG PO TABS: 1.0000 | ORAL_TABLET | ORAL | Status: DC | PRN

## 2012-09-27 MED ORDER — AMOXICILLIN-POT CLAVULANATE 500-125 MG PO TABS: 1.0000 | ORAL_TABLET | Freq: Three times a day (TID) | ORAL | Status: DC

## 2012-09-27 MED ORDER — JEVITY 1.2 CAL PO LIQD: 1000.0000 mL | ORAL | Status: DC

## 2012-09-27 MED ORDER — FREE WATER: 200.0000 mL | Freq: Three times a day (TID) | Status: DC

## 2012-09-27 MED ORDER — JEVITY 1.2 CAL PO LIQD
1000.0000 mL | ORAL | Status: DC
  Administered 2012-09-27: 1000 mL

## 2012-09-27 MED ORDER — BISACODYL 10 MG RE SUPP: 10.0000 mg | Freq: Every day | RECTAL | Status: DC

## 2012-09-27 MED ORDER — POTASSIUM CHLORIDE 20 MEQ/15ML (10%) PO LIQD
20.0000 meq | Freq: Two times a day (BID) | ORAL | Status: DC
  Administered 2012-09-27: 20 meq via ORAL
  Filled 2012-09-27 (×2): qty 15

## 2012-09-27 MED ORDER — POTASSIUM CHLORIDE 20 MEQ/15ML (10%) PO LIQD: 20.0000 meq | Freq: Two times a day (BID) | ORAL | Status: DC

## 2012-09-27 MED ORDER — HALOPERIDOL 0.5 MG PO TABS: 0.5000 mg | ORAL_TABLET | Freq: Two times a day (BID) | ORAL | Status: DC

## 2012-09-27 MED ORDER — FREE WATER
200.0000 mL | Freq: Three times a day (TID) | Status: DC
  Administered 2012-09-27: 200 mL

## 2012-09-27 NOTE — Progress Notes (Signed)
CSW received notification from PMT NP, Lorinda Creed that PMT GOC meeting was held and plan is for PEG tube to remain in place at this time and SNF placement.   CSW contacted pt legal guardian, [name] and notified her that [facility] was able to offer a bed. Pt legal guardian is agreeable to [facility] for pt placement.   CSW contacted [facility] and facility can accept pt today.  CSW notified MD.  CSW to facilitate pt discharge needs this afternoon if MD feels pt medically stable for discharge today.  Jacklynn Lewis, MSW, LCSWA  Clinical Social Work [phone]

## 2012-09-27 NOTE — Progress Notes (Signed)
PT Cancellation Note  Patient Details Name: Joel Summers MRN: [id number] DOB: 11-20-1930   Cancelled Treatment:    Reason Eval/Treat Not Completed: Other (comment) (Pt sleeping soundly, didn't arouse to verbal/tactile stimuli). RN requested pt be allowed to rest.  Will follow.    Ralene Bathe Kistler 09/27/2012, 1:08 PM [phone]

## 2012-09-27 NOTE — Progress Notes (Signed)
3 Days Post-Op  Subjective: Pt without new c/o  Objective: Vital signs in last 24 hours: Temp:  [98.7 F (37.1 C)] 98.7 F (37.1 C) (08/15 0519) Pulse Rate:  [67-87] 87 (08/15 0519) Resp:  [13-27] 20 (08/15 0519) BP: (136-163)/(57-81) 143/57 mmHg (08/15 0519) SpO2:  [88 %-99 %] 91 % (08/15 0519) Last BM Date: 09/26/12  Intake/Output from previous day: 08/14 0701 - 08/15 0700 In: 2242.5 [I.V.:1542.5; IV Piggyback:600] Out: 1426 [Urine:1425; Stool:1] Intake/Output this shift:    G tube intact, insertion site ok, NT; abd- soft,ND  Lab Results:   Recent Labs  09/25/12 0325 09/26/12 0350  WBC 9.5 9.4  HGB 11.0* 11.5*  HCT 32.0* 33.6*  PLT 454* 460*   BMET  Recent Labs  09/26/12 0350 09/27/12 0340  NA 138 137  K 3.0* 3.6  CL 93* 95*  CO2 42* 40*  GLUCOSE 134* 120*  BUN 10 8  CREATININE 0.64 0.62  CALCIUM 8.8 8.6   PT/INR  Recent Labs  09/26/12 0350  LABPROT 15.5*  INR 1.26   ABG No results found for this basename: PHART, PCO2, PO2, HCO3,  in the last 72 hours  Studies/Results: Ir Gastrostomy Tube Mod Sed  09/26/2012   *RADIOLOGY REPORT*  Clinical history:Altered mental status and aspiration risk.  Poor nutrition.  PROCEDURE(S): PERCUTANEOUS GASTROSTOMY TUBE WITH FLUOROSCOPIC GUIDANCE  Physician: Rachelle Hora. Henn, MD  Medications:Fentanyl 100 mcg  Fluoroscopy time: 12 minutes and 36 seconds.  Procedure:Informed consent was obtained for a percutaneous gastrostomy tube.  The patient was placed on the interventional table. A rectal tube was placed and a barium enema was performed in order to opacify the entire transverse colon.  Rectal tube balloon was deflated.  An orogastric tube is placed with fluoroscopic guidance.  The anterior abdomen was prepped and draped in sterile fashion.  Maximal barrier sterile technique was utilized including caps, mask, sterile gowns, sterile gloves, sterile drape, hand hygiene and skin antiseptic.  Stomach was inflated with air through  the orogastric tube.  The skin and subcutaneous tissues were anesthetized with 1% lidocaine.  A 17 gauge needle was directed into the distended stomach with fluoroscopic guidance.  A wire was advanced into the stomach.  A 9-French vascular sheath was placed and the orogastric tube was snared using a [employer] snare device. The orogastric tube and snare were pulled out of the patient's mouth.  The snare device was connected to a 20-French gastrostomy tube.  The snare device and gastrostomy tube were pulled through the patient's mouth and out the anterior abdominal wall.  The gastrostomy tube was cut to an appropriate length.  Contrast injection through gastrostomy tube confirmed placement within the stomach.  Fluoroscopic images were obtained for documentation.  The gastrostomy tube was flushed with normal saline.  Findings:Gastrostomy tube within the stomach.  Complications: None  Impression:Successful fluoroscopic guided percutaneous gastrostomy tube placement.   Original Report Authenticated By: Richarda Overlie, M.D.    Anti-infectives: Anti-infectives   Start     Dose/Rate Route Frequency Ordered Stop   09/21/12 0800  vancomycin (VANCOCIN) IVPB 1000 mg/200 mL premix     1,000 mg 200 mL/hr over 60 Minutes Intravenous Every 12 hours 09/20/12 2209 09/28/12 1959   09/21/12 0200  ceFEPIme (MAXIPIME) 1 g in dextrose 5 % 50 mL IVPB     1 g 100 mL/hr over 30 Minutes Intravenous Every 8 hours 09/20/12 2153 09/28/12 1759   09/20/12 1830  vancomycin (VANCOCIN) IVPB 1000 mg/200 mL premix  1,000 mg 200 mL/hr over 60 Minutes Intravenous  Once 09/20/12 1808 09/20/12 2100   09/20/12 1800  ceFEPIme (MAXIPIME) 1 g in dextrose 5 % 50 mL IVPB  Status:  Discontinued     1 g 100 mL/hr over 30 Minutes Intravenous Every 8 hours 09/20/12 1724 09/20/12 1912   09/20/12 1700  cefTRIAXone (ROCEPHIN) 1 g in dextrose 5 % 50 mL IVPB  Status:  Discontinued     1 g 100 mL/hr over 30 Minutes Intravenous Every 24 hours 09/20/12  1651 09/20/12 1728   09/20/12 1700  azithromycin (ZITHROMAX) 500 mg in dextrose 5 % 250 mL IVPB  Status:  Discontinued     500 mg 250 mL/hr over 60 Minutes Intravenous Every 24 hours 09/20/12 1651 09/20/12 1912      Assessment/Plan: s/p perc gastrostomy tube placement 8/14; ok to use tube; other plans per primary service  LOS: 7 days    ALLRED,D [facility] 09/27/2012

## 2012-09-27 NOTE — Progress Notes (Signed)
Pt for discharge to [facility].  CSW facilitated pt discharge needs including contacting facility, faxing pt discharge information via [facility], notifying pt legal guardian, providing RN phone number to call report, and arranging ambulance transportation for pt to [facility] (Service Request ID#: [id number]).  No further social work needs identified at this time.  CSW signing off.   Jacklynn Lewis, MSW, LCSWA  Clinical Social Work [phone]

## 2012-09-27 NOTE — Discharge Summary (Addendum)
Physician Discharge Summary  Joel Summers ZOX:[id number] DOB: 1931-01-18 DOA: 09/20/2012  PCP: Ron Parker, MD  Admit date: 09/20/2012 Discharge date: 09/27/2012  Recommendations for Outpatient Follow-up:  1. Jevity 1.2 tube feeding ordered. Started 20 cc per hour. May increase by 10 cc every 4 hours until goal rate of 65 cc per hour was reached. 2. For problems or questions regarding the gastrostomy tube, contact interventional radiologist on call at [phone]. 3. Check residuals every 4 hours. If greater than 400 mL, re-feed entire residual, continue tube feed and recheck residual in one hour. If residual remains greater than 400, re-feed entire residual and decrease the tube feed rate by 50% and recheck residual in 2 hours. If residual remains greater than 400 cc, re-feed entire residual and notify M.D. for consideration of prokinetic. If residual less than 400 cc, re-feed entire residual and resume tube feeding at previous rate. 4. Irrigate gastrostomy tube with 20 cc of water after use with medications or at least once per shift. 5. Notify M.D. of abdominal pain, distention, vomiting, or suspected aspiration.  Discharge Diagnoses:  Principal Problem:    Acute respiratory failure with hypoxia secondary to aspiration pneumonia Active Problems:    COPD (chronic obstructive pulmonary disease)    HCAP (healthcare-associated pneumonia)    Anemia    Hypokalemia    Leukocytosis    Dementia    Lung mass    DM (diabetes mellitus)    HTN (hypertension)    Diabetic neuropathy    Severe protein-calorie malnutrition   Discharge Condition: Improved.  Diet recommendation: N.p.o. except for ice chips. Tube feeding as outlined above.  History of present illness:  Joel Summers is an 79 y.o. male with a PMH of hypertension, dementia, COPD, diabetes who was admitted on 09/20/2012 with acute respiratory failure. CXR revealed right upper lung lobe mass consistent with history of  lung cancer. No previous records of history of lung cancer or treatments. CT chest was significant for possible aspiration in addition to debris within the right-sided endobronchial tree with bibasilar opacities, right-sided pulmonary process which was primarily felt to be treatment/radiation related. No convincing evidence of dominant residual or recurrent primary tumor. PCCM consulted and he underwent bronchoscopy with lavage 09/25/2012. Bronchial washings were negative for malignancy. Per swallow evaluation, pt continues to be at high risk of aspiration and remains NPO, status post PEG tube placement 09/26/2012 with initiation of tube feeding on 09/27/2012.   Hospital Course by problem:  Principal Problem:  *Acute respiratory failure with hypoxia secondary to aspiration pneumonia  - Provided with supplemental oxygen support, now weaned to nasal cannula.  - Felt to be from aspiration pneumonia/post obstructive penumonia related to lung mass/lung cancer.  - Has had 7 days of vanco and cefepime.  Would treat for 3 additional days with Augmentin. Blood and sputum cultures negative to date. Legionella pneumonia and strep pneumonia antigen was negative.  - Status post pulmonary consultation and bronchoscopy with lavage 09/24/2012 with no subsequent complications; results from brushings negative for malignancy.  - Continue bronchodilator therapy at discharge.  - Prednisone tapered to off.  - Continue aspiration precautions.  COPD (chronic obstructive pulmonary disease)  - COPD order set in place; COPD gold alert ordered.  - Status post management with steroids (PO), nebulizer treatments PRN and antibiotics.  Active Problems:  Anemia of chronic disease  - Hemoglobin stable with no current indication for transfusion.  Hypokalemia  - Monitor and supplement as needed. Magnesium okay. Potassium added to IV fluids which  corrected potassium.  Leukocytosis  - Likely due to steroids and pneumonia, resolved.   Dementia with behavioral disturbances  - Continue aricept and namenda.  - Continue haldol 0.5 mg PO BID.  Lung mass  - Status post bronch with lavage 09/24/2012. Bronchial washings negative for malignancy.  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 CBGs 109-140.  Diabetic neuropathy  - Continue gabapentin 100 mg PO TID.  HTN (hypertension)  - Continue Hctz and felodipine.  Severe protein calorie malnutrition  - NPO due to risk of aspiration.  - Status post swallow evaluation, patient felt to be at high risk of aspiration.  - Peg tube placed 09/26/2012. Initiate tube feedings per dietitian recommendations.  Procedures:  PEG tube placed 09/26/2012.  Medical Consultants:  Dr. Cyril Mourning, Pulmonology  Dr. Abundio Miu, IR   Discharge Exam: Filed Vitals:   09/27/12 1027  BP: 143/58  Pulse: 71  Temp:   Resp: 20   Filed Vitals:   09/26/12 1644 09/26/12 2050 09/27/12 0519 09/27/12 1027  BP: 163/78 138/58 143/57 143/58  Pulse: 71 87 87 71  Temp:  98.7 F (37.1 C) 98.7 F (37.1 C)   TempSrc:  Oral Oral   Resp: 13 20 20 20   Height:      Weight:      SpO2:  99% 91% 91%    Gen:  NAD Cardiovascular:  RRR, No M/R/G Respiratory: Lungs CTAB Gastrointestinal: Abdomen soft, NT/ND with normal active bowel sounds. Extremities: No C/E/C   Discharge Instructions  Discharge Orders   Future Orders Complete By Expires   Call MD for:  persistant nausea and vomiting  As directed    Call MD for:  severe uncontrolled pain  As directed    Call MD for:  temperature >100.4  As directed    Diet general  As directed    Scheduling Instructions:     Jevity tube feeding with ice chips by mouth ONLY.   Discharge instructions  As directed    Comments:     You were cared for by Dr. Hillery Aldo  (a hospitalist) during your hospital stay. If you have any questions about your discharge medications or the care you received while you  were in the hospital after you are discharged, you can call the unit and ask to speak with the hospitalist on call if the hospitalist that took care of you is not available. Once you are discharged, your primary care physician will handle any further medical issues. Please note that NO REFILLS for any discharge medications will be authorized once you are discharged, as it is imperative that you return to your primary care physician (or establish a relationship with a primary care physician if you do not have one) for your aftercare needs so that they can reassess your need for medications and monitor your lab values.  Any outstanding tests can be reviewed by your PCP at your follow up visit.  It is also important to review any medicine changes with your PCP.  Please bring these d/c instructions with you to your next visit so your physician can review these changes with you.  If you do not have a primary care physician, you can call [phone] for a physician referral.  It is highly recommended that you obtain a PCP for hospital follow up.   Increase activity slowly  As directed    Walk with assistance  As directed    Dan Humphreys  As directed        Medication List    STOP taking these medications       levofloxacin 500 MG tablet  Commonly known as:  LEVAQUIN     loperamide 2 MG tablet  Commonly known as:  IMODIUM A-D     predniSONE 20 MG tablet  Commonly known as:  DELTASONE      TAKE these medications       acetaminophen 500 MG tablet  Commonly known as:  TYLENOL  Take 500 mg by mouth every 6 (six) hours as needed for pain.     acidophilus Caps capsule  Take 1 capsule by mouth daily. For 20 days     albuterol 108 (90 BASE) MCG/ACT inhaler  Commonly known as:  PROVENTIL HFA;VENTOLIN HFA  Inhale 1-2 puffs into the lungs every 4 (four) hours as needed for wheezing or shortness of breath.     albuterol 2 MG tablet  Commonly known as:  PROVENTIL  Take 2 mg by mouth 3 (three) times daily.  For 5 days     amoxicillin-clavulanate 500-125 MG per tablet  Commonly known as:  AUGMENTIN  Take 1 tablet (500 mg total) by mouth 3 (three) times daily. For 3 more days.     aspirin 81 MG chewable tablet  Chew 81 mg by mouth daily.     bisacodyl 10 MG suppository  Commonly known as:  DULCOLAX  Place 1 suppository (10 mg total) rectally daily.     citalopram 20 MG tablet  Commonly known as:  CELEXA  Take 20 mg by mouth daily.     divalproex 500 MG 24 hr tablet  Commonly known as:  DEPAKOTE ER  Take 500 mg by mouth 2 (two) times daily.     donepezil 10 MG tablet  Commonly known as:  ARICEPT  Take 10 mg by mouth at bedtime.     feeding supplement (JEVITY 1.2 CAL) Liqd  Place 1,000 mL into feeding tube continuous.     felodipine 5 MG 24 hr tablet  Commonly known as:  PLENDIL  Take 5 mg by mouth daily.     free water Soln  Place 200 mL into feeding tube every 8 (eight) hours.     gabapentin 100 MG capsule  Commonly known as:  NEURONTIN  Take 100 mg by mouth 3 (three) times daily.     guaiFENesin 100 MG/5ML liquid  Commonly known as:  ROBITUSSIN  Take 200 mg by mouth 3 (three) times daily as needed for cough.     haloperidol 0.5 MG tablet  Commonly known as:  HALDOL  Take 0.5 mg by mouth 2 (two) times daily.     hydrochlorothiazide 25 MG tablet  Commonly known as:  HYDRODIURIL  Take 25 mg by mouth daily.     HYDROcodone-acetaminophen 5-325 MG per tablet  Commonly known as:  NORCO/VICODIN  Take 1-2 tablets by mouth every 4 (four) hours as needed.     magnesium hydroxide 400 MG/5ML suspension  Commonly known as:  MILK OF MAGNESIA  Take 30 mLs by mouth daily as needed for constipation.     metFORMIN 500 MG tablet  Commonly known as:  GLUCOPHAGE  Take 500 mg by mouth daily with breakfast.     NAMENDA XR 14 MG Cp24  Generic drug:  Memantine HCl ER  Take by mouth.     potassium chloride 20 MEQ/15ML (10%) solution  Take 15 mL (20 mEq total) by mouth 2 (two)  times daily.          The results of significant diagnostics from this hospitalization (including imaging, microbiology, ancillary and laboratory) are listed below for reference.    Significant Diagnostic Studies: Dg Chest 2 View  09/20/2012   *RADIOLOGY REPORT*  Clinical Data: Fall, weakness, unable to walk well, the kidney, cough, history lung cancer, hypertension, COPD  CHEST - 2 VIEW  Comparison: Exam at 1603 hours compared to earlier study of 0425 hours  Findings: Mild volume loss in right hemithorax again noted. Enlargement of cardiac silhouette with tortuous aorta. Pulmonary vascular congestion. Right perihilar opacity compatible with tumor and post-treatment changes. Bibasilar atelectasis versus infiltrates with small right pleural effusion. No pneumothorax. Bones demineralized.  IMPRESSION: Right perihilar opacity consistent with lung cancer and post- therapy changes. COPD changes with bibasilar atelectasis versus infiltrates and small right pleural effusion.   Original Report Authenticated By: Ulyses Southward, M.D.   Dg Chest 2 View  09/20/2012   *RADIOLOGY REPORT*  Clinical Data: Status post fall.  Chest pain.  History of lung cancer.  CHEST - 2 VIEW  Comparison: None.  Findings: There is volume loss in the right chest with a mass-like opacity in the right upper lobe.  Blunting of the right costophrenic angle could be due to a small effusion or scar. Surgical clips right hilum noted.  The left lung appears clear. Heart size is normal.  IMPRESSION: Post-treatment change right chest with a mass-like opacity right upper lobe compatible with history of lung cancer.  Small right pleural effusion.   Original Report Authenticated By: Holley Dexter, M.D.   Ct Head Wo Contrast  09/20/2012   *RADIOLOGY REPORT*  Clinical Data:  Status post fall with a blow to the head.  CT HEAD WITHOUT CONTRAST CT CERVICAL SPINE WITHOUT CONTRAST  Technique:  Multidetector CT imaging of the head and cervical spine was  performed following the standard protocol without intravenous contrast.  Multiplanar CT image reconstructions of the cervical spine were also generated.  Comparison:  None.  CT HEAD  Findings: The brain is atrophic with chronic microvascular ischemic change.  No acute intracranial abnormality including infarction, hemorrhage, mass lesion, mass effect, midline shift or abnormal extra-axial fluid collection is identified.  Hematoma over the frontal bone is noted.  No fracture is seen.  IMPRESSION:  1.  Hematoma over the frontal bone without underlying fracture or for acute intracranial abnormality. 2.  Atrophy and chronic microvascular ischemic change.  CT CERVICAL SPINE  Findings: No fracture or subluxation of the cervical spine is identified. Loss of disc space height and endplate spurring are most notable at C5-6 and C6-7.  Multilevel facet degenerative change is worse on the left.  Paraspinous soft tissue structures are unremarkable.  Apical scarring on the right is noted.  IMPRESSION: No acute finding.  Degenerative disc disease C5-6 and C6-7.   Original Report Authenticated By: Holley Dexter, M.D.   Ct Chest W Contrast  09/21/2012   *RADIOLOGY REPORT*  Clinical Data: Evaluate right upper lobe lung mass. History lung cancer.  Elevated white blood cells.  Acute respiratory distress. History of dementia.  CT CHEST WITH CONTRAST  Technique:  Multidetector CT imaging of the chest was performed following the standard protocol during bolus administration of intravenous contrast.  Contrast: 80mL OMNIPAQUE IOHEXOL 300 MG/ML  SOLN  Comparison: Plain film of 09/20/2012.  No prior CT.  Findings: Lungs/pleura: Mild motion degradation.  Material in the right mainstem bronchus on image 38/series 6 and lower lobe bronchi  on image 43/series 6.  Right greater than left patchy bibasilar airspace disease.  Right suprahilar confluent pulmonary opacity with bronchiectasis.  This is identified lateral and anterior to surgical  sutures, including on image 39/series 6.  Trace left pleural fluid.  Small right-sided pleural effusion with minimal wall thickening or loculation anteriorly.  Surgical sutures about right lower lobe bronchi.  Heart/Mediastinum: Mild cardiomegaly.  Coronary artery atherosclerosis.  Main pulmonary artery enlargement. No central pulmonary embolism, on this non-dedicated study.  Mediastinal deviation to the right with volume loss in the right hemithorax.  Right paratracheal node 1.2 cm on image 28. Subcarinal node 1.7 cm on image 41.  Upper abdomen: Fatty atrophy of the pancreas.  Normal imaged adrenal glands.  Bones/Musculoskeletal:  Postsurgical or post-traumatic defects of right-sided ribs.  IMPRESSION:  1.  Findings which are highly suspicious for aspiration.  Debris within the right-sided endobronchial tree with bibasilar, right greater than left patchy airspace opacities. 2.  Right-sided pulmonary process which is primarily felt to be treatment/radiation related.  No convincing evidence of dominant residual or recurrent primary tumor.  CT follow-up and consideration of eventual PET may be informative.  In addition, comparison with prior exams would be useful. 3.  Thoracic adenopathy, for which which metastatic disease is suspected.  Alternatively, these nodes could be reactive. 4.  Right greater than left bilateral pleural effusions. 5. Pulmonary artery enlargement suggests pulmonary arterial hypertension.   Original Report Authenticated By: Jeronimo Greaves, M.D.   Ct Cervical Spine Wo Contrast  09/20/2012   *RADIOLOGY REPORT*  Clinical Data:  Status post fall with a blow to the head.  CT HEAD WITHOUT CONTRAST CT CERVICAL SPINE WITHOUT CONTRAST  Technique:  Multidetector CT imaging of the head and cervical spine was performed following the standard protocol without intravenous contrast.  Multiplanar CT image reconstructions of the cervical spine were also generated.  Comparison:  None.  CT HEAD  Findings: The brain  is atrophic with chronic microvascular ischemic change.  No acute intracranial abnormality including infarction, hemorrhage, mass lesion, mass effect, midline shift or abnormal extra-axial fluid collection is identified.  Hematoma over the frontal bone is noted.  No fracture is seen.  IMPRESSION:  1.  Hematoma over the frontal bone without underlying fracture or for acute intracranial abnormality. 2.  Atrophy and chronic microvascular ischemic change.  CT CERVICAL SPINE  Findings: No fracture or subluxation of the cervical spine is identified. Loss of disc space height and endplate spurring are most notable at C5-6 and C6-7.  Multilevel facet degenerative change is worse on the left.  Paraspinous soft tissue structures are unremarkable.  Apical scarring on the right is noted.  IMPRESSION: No acute finding.  Degenerative disc disease C5-6 and C6-7.   Original Report Authenticated By: Holley Dexter, M.D.   Ir Gastrostomy Tube Mod Sed  09/26/2012   *RADIOLOGY REPORT*  Clinical history:Altered mental status and aspiration risk.  Poor nutrition.  PROCEDURE(S): PERCUTANEOUS GASTROSTOMY TUBE WITH FLUOROSCOPIC GUIDANCE  Physician: Rachelle Hora. Henn, MD  Medications:Fentanyl 100 mcg  Fluoroscopy time: 12 minutes and 36 seconds.  Procedure:Informed consent was obtained for a percutaneous gastrostomy tube.  The patient was placed on the interventional table. A rectal tube was placed and a barium enema was performed in order to opacify the entire transverse colon.  Rectal tube balloon was deflated.  An orogastric tube is placed with fluoroscopic guidance.  The anterior abdomen was prepped and draped in sterile fashion.  Maximal barrier sterile technique was utilized including caps,  mask, sterile gowns, sterile gloves, sterile drape, hand hygiene and skin antiseptic.  Stomach was inflated with air through the orogastric tube.  The skin and subcutaneous tissues were anesthetized with 1% lidocaine.  A 17 gauge needle was directed  into the distended stomach with fluoroscopic guidance.  A wire was advanced into the stomach.  A 9-French vascular sheath was placed and the orogastric tube was snared using a Gooseneck snare device. The orogastric tube and snare were pulled out of the patient's mouth.  The snare device was connected to a 20-French gastrostomy tube.  The snare device and gastrostomy tube were pulled through the patient's mouth and out the anterior abdominal wall.  The gastrostomy tube was cut to an appropriate length.  Contrast injection through gastrostomy tube confirmed placement within the stomach.  Fluoroscopic images were obtained for documentation.  The gastrostomy tube was flushed with normal saline.  Findings:Gastrostomy tube within the stomach.  Complications: None  Impression:Successful fluoroscopic guided percutaneous gastrostomy tube placement.   Original Report Authenticated By: Richarda Overlie, M.D.   Dg Chest Port 1 View  09/24/2012   *RADIOLOGY REPORT*  Clinical Data: Post bronchoscopy  PORTABLE CHEST - 1 VIEW  Comparison: Study obtained earlier in the day.  Findings:  No pneumothorax.  There is scarring in the right mid lung with retraction and volume loss.  There is apical pleural thickening on the right.  There is scarring in the left base as well.  There is no new opacity.  Heart size and pulmonary vascularity are within normal limits given the scarring and retraction on the right.  There is underlying emphysema.  IMPRESSION: No appreciable change compared to study obtained earlier in the day.  No pneumothorax.   Original Report Authenticated By: Bretta Bang, M.D.   Dg Chest Port 1 View  09/24/2012   *RADIOLOGY REPORT*  Clinical Data: Follow up airspace disease  PORTABLE CHEST - 1 VIEW  Comparison: 09/20/2012  Findings: Right perihilar density unchanged.  Right pleural effusion/pleural scarring unchanged.  There is right lower lobe collapse which has developed in the interval.  This may be due to  endobronchial plugging from mucus or aspiration.  Mild left lower lobe infiltrate unchanged.  No effusion on the left.  Underlying COPD.  IMPRESSION: Right lower lobe collapse has occurred in the interval.  There is a small right effusion/pleural scarring.  Mild left lower lobe infiltrate is unchanged.   Original Report Authenticated By: Janeece Riggers, M.D.   Dg Swallowing Func-speech Pathology  09/23/2012   Chales Abrahams, CCC-SLP     09/23/2012  3:21 PM Objective Swallowing Evaluation: Modified Barium Swallowing Study   Patient Details  Name: KONNAR BEN MRN: [id number] Date of Birth: 05-02-1930  Today's Date: 09/23/2012 Time: 4098-1191 SLP Time Calculation (min): 30 min  Past Medical History:  Past Medical History  Diagnosis Date  . Altered mental status   . COPD (chronic obstructive pulmonary disease)   . Diabetes mellitus without complication   . Hypertension   . Cancer   . Lung cancer    Past Surgical History: History reviewed. No pertinent past  surgical history. HPI:  79 yo resident of ALF [facility])  admitted to [facility]  after falling-pt found to have pna.  PMH + for HTN, COPD,  dementia.  Pt CT head showed hematoma over frontal bone, atrophy.   Pt with possible debris in right lung concerning for aspiration.   Pt denies dysphagia but given his dementia, uncertain of ability  to recall information.  BSE completed with indications for MBS  and order was obtained.       Assessment / Plan / Recommendation Clinical Impression  Dysphagia Diagnosis: Moderate oral phase dysphagia;Moderate  pharyngeal phase dysphagia (suspect esophageal component as well)   Clinical impression: Pt presents with moderate oropharyngeal  sensorimotor dysphagia with decreased oral bolus containment from  weakness resulting in premature spillage of barium into pharynx  and oral residuals (mostly with liquids) without pt sensation.   Pt's pharyngeal swallow characterized by delayed initiation  (cracker accumulating in  vallecular space for 4 seconds prior to  swallow) and liquids spilled to pyriform sinus prior to swallow.   Decreased tongue base retraction, laryngeal elevation, pharyngeal  contraction result in pharyngeal stasis with liquids and barium  tablet WITHOUT awareness and laryngeal penetration of both  nectar/thin consistencies.  Chin tuck not helpful and cued cough  was WEAK and did not clear penetrates.  Barium tablet given with  pudding precariously lodged at vallecular space without pt  awareness, thin aided clearance but with penetration.   Given pt's increased dyspenia with effort, delayed swallow  responses, no awareness to stasis and weak nonprotective cough,  he is deemed a high aspiration risk with po.  Suspect pt is  aspirating secretions as barium mixed with secretions from oral  cavity and were penetrated.  Skilled intervention included  educating pt to findings via video monitor, providing visual and  verbal feedback to compensation strategies.    Options may be to allow po with known risk and strategies to  mitigate risk or keep npo except medicine crushed with applesauce  and ice prn after oral care.   Would need repeat MBS after  medical/respiratory improvement due to sensory motor deficits.   Unfortunately pt coughed throughout entire MBS making clinical  evaluation inaccurate and pt's cough is weak and nonprotective.    SLP phoned ALF after MBS and spoke to [place], resident care  Production designer, theatre/television/film, who reported pt was on a regular/thin diet and took  medications with water with good tolerance as far as she knew.   Grover Canavan stated pt did not have significant weight loss nor  pulmonary infections recently as far as she knew.         Treatment Recommendation  Therapy as outlined in treatment plan below    Diet Recommendation NPO;Ice chips PRN after oral care (vs po of  pureed/thin with risks accepted)   Medication Administration: Crushed with puree    Other  Recommendations Recommended Consults: MBS Oral Care  Recommendations: Oral care BID   Follow Up Recommendations  Skilled Nursing facility    Frequency and Duration min 2x/week  2 weeks   Pertinent Vitals/Pain Afebrile, nonproductive congested cough, pt  unable to clear, occasional wet voice-? Asp of secretions    SLP Swallow Goals Patient will utilize recommended strategies during swallow to  increase swallowing safety with: Total assistance Goal #3: Pt and MD will determine if desire for pt to eat with  known aspiration risk and strategies to mitigate it.     General HPI: 79 yo resident of ALF [facility])   admitted to [facility] after falling-pt found to have pna.  PMH + for  HTN, COPD, dementia.  Pt CT head showed hematoma over frontal  bone, atrophy.  Pt with possible debris in right lung concerning  for aspiration.  Pt denies dysphagia but given his dementia,  uncertain of ability to recall information.  BSE completed with  indications  for MBS and order was obtained.   Type of Study: Modified Barium Swallowing Study Reason for Referral: Objectively evaluate swallowing function Previous Swallow Assessment: none in epic Diet Prior to this Study: NPO Temperature Spikes Noted: No Respiratory Status: Supplemental O2 delivered via (comment) History of Recent Intubation: No Behavior/Cognition:  Alert;Cooperative;Confused;Impulsive;Distractible;Decreased  sustained attention Oral Cavity - Dentition: Edentulous Oral Motor / Sensory Function: Impaired - see Bedside swallow  eval Self-Feeding Abilities: Needs assist Patient Positioning: Upright in chair Baseline Vocal Quality: Low vocal intensity Volitional Cough: Weak;Congested Volitional Swallow: Able to elicit Anatomy: Within functional limits Pharyngeal Secretions: Standing secretions in (comment)    Reason for Referral Objectively evaluate swallowing function   Oral Phase Oral Preparation/Oral Phase Oral Phase: Impaired Oral - Nectar Oral - Nectar Teaspoon: Reduced posterior propulsion;Piecemeal   swallowing;Delayed oral transit;Weak lingual manipulation Oral - Nectar Cup: Reduced posterior propulsion;Piecemeal  swallowing;Delayed oral transit;Weak lingual manipulation Oral - Thin Oral - Thin Teaspoon: Delayed oral transit;Piecemeal  swallowing;Weak lingual manipulation Oral - Thin Cup: Piecemeal swallowing;Reduced posterior  propulsion;Weak lingual manipulation;Lingual/palatal residue Oral - Thin Straw: Piecemeal swallowing;Reduced posterior  propulsion;Weak lingual manipulation;Lingual/palatal residue Oral - Solids Oral - Puree: Reduced posterior propulsion;Delayed oral  transit;Weak lingual manipulation Oral - Regular: Delayed oral transit;Impaired mastication;Reduced  posterior propulsion;Lingual/palatal residue;Weak lingual  manipulation Oral - Pill: Reduced posterior propulsion;Delayed oral  transit;Weak lingual manipulation Oral Phase - Comment Oral Phase - Comment: oral residuals prematurely spill into  pharynx without pt awareness- at times pt required cues to  swallow and response was delayed   Pharyngeal Phase Pharyngeal Phase Pharyngeal Phase: Impaired Pharyngeal - Nectar Pharyngeal - Nectar Teaspoon: Delayed swallow  initiation;Pharyngeal residue - valleculae;Reduced laryngeal  elevation;Reduced airway/laryngeal closure;Reduced tongue base  retraction;Reduced pharyngeal peristalsis Pharyngeal - Nectar Cup: Delayed swallow initiation;Premature  spillage to pyriform sinuses;Reduced airway/laryngeal  closure;Reduced laryngeal elevation;Reduced tongue base  retraction;Pharyngeal residue - valleculae;Pharyngeal residue -  pyriform sinuses Pharyngeal - Thin Pharyngeal - Thin Teaspoon: Premature spillage to pyriform  sinuses;Reduced airway/laryngeal closure;Reduced laryngeal  elevation;Delayed swallow initiation;Reduced pharyngeal  peristalsis;Pharyngeal residue - valleculae (swallow triggered at  approx 2 seconds at vallecular space) Pharyngeal - Thin Cup: Premature spillage to valleculae;Delayed   swallow initiation;Premature spillage to pyriform  sinuses;Penetration/Aspiration during swallow;Reduced laryngeal  elevation;Reduced airway/laryngeal closure;Reduced tongue base  retraction;Reduced pharyngeal peristalsis;Pharyngeal residue -  valleculae Penetration/Aspiration details (thin cup): Material enters  airway, remains ABOVE vocal cords and not ejected out Pharyngeal - Thin Straw: Delayed swallow initiation;Premature  spillage to pyriform sinuses;Penetration/Aspiration during  swallow;Penetration/Aspiration after swallow;Reduced  airway/laryngeal closure;Reduced laryngeal elevation;Reduced  pharyngeal peristalsis Penetration/Aspiration details (thin straw): Material enters  airway, remains ABOVE vocal cords and not ejected out Pharyngeal - Solids Pharyngeal - Puree: Delayed swallow initiation;Premature spillage  to valleculae (trace base of tongue stasis without awarness) Pharyngeal - Regular: Delayed swallow initiation;Premature  spillage to valleculae (delay up to 4 seconds filling vallecular  space) Pharyngeal - Pill: Delayed swallow initiation;Pharyngeal residue  - valleculae;Premature spillage to valleculae;Reduced tongue base  retraction;Reduced laryngeal elevation;Reduced airway/laryngeal  closure (pt did not sense barium tablet lodged in pharynx) Pharyngeal Phase - Comment Pharyngeal Comment: pt with delayed pharyngeal swallow initiation  and no awareness to tongue base and vallecular stasis, cued dry  swallows helpful but pt was delayed in performance and would  likely be taxing for pt with COPD, cued cough did not fully clear  trace penetration, chin tuck did not prevent penetration of thin  was difficult for pt to perform, nectar liquids resulted in more  stasis than thin, pharyngeal  swallow strong with solid and puree  without stasis   Cervical Esophageal Phase    GO    Cervical Esophageal Phase Cervical Esophageal Phase: Impaired Cervical Esophageal Phase - Comment Cervical Esophageal Comment:  After pt consumed a very small  amount of nectar barium *one cup sip and one tsp, pt appeared  with significant distal stasis without awareness that did not  clear, swallowing water boluses aided clearance; further boluses  of thin resulted in distal stasis - again requiring water bolus  to clear.  Pt appeared to clear puree, cracker and barium tablet  in timely fashion.  Would recommend strict esophageal/reflux  precautions for this pt.         Donavan Burnet, MS [facility] [phone]     Labs:  Basic Metabolic Panel:  Recent Labs Lab 09/20/12 1510 09/20/12 2010  09/23/12 0354 09/24/12 0340 09/25/12 0325 09/26/12 0350 09/27/12 0340  NA 137 138  < > 137 136 136 138 137  K 3.9 3.6  < > 3.0* 2.5* 3.1* 3.0* 3.6  CL 95* 96  < > 96 94* 92* 93* 95*  CO2 31 33*  < > 36* 40* 41* 42* 40*  GLUCOSE 207* 158*  < > 240* 196* 172* 134* 120*  BUN 18 15  < > 9 5* 9 10 8   CREATININE 0.83 0.72  < > 0.58 0.55 0.60 0.64 0.62  CALCIUM 8.8 8.9  < > 8.8 8.9 8.6 8.8 8.6  MG  --  1.8  --   --   --   --  1.7  --   PHOS  --  2.9  --   --   --   --   --   --   < > = values in this interval not displayed. GFR Estimated Creatinine Clearance: 73.6 ml/min (by C-G formula based on Cr of 0.62). Liver Function Tests:  Recent Labs Lab 09/20/12 1510 09/20/12 2010 09/21/12 0352  AST 19 18 21   ALT 19 18 16   ALKPHOS 91 92 93  BILITOT 0.5 0.3 0.3  PROT 6.5 6.6 6.1  ALBUMIN 2.4* 2.4* 2.2*   Coagulation profile  Recent Labs Lab 09/20/12 2010 09/26/12 0350  INR 1.28 1.26    CBC:  Recent Labs Lab 09/20/12 1510 09/20/12 2010 09/21/12 0352 09/23/12 0354 09/24/12 0340 09/25/12 0325 09/26/12 0350  WBC 18.8* 20.9* 18.4* 15.2* 10.4 9.5 9.4  NEUTROABS 15.8* 17.3*  --   --   --   --   --   HGB 11.4* 11.5* 11.0* 11.7* 11.7* 11.0* 11.5*  HCT 33.2* 33.7* 31.9* 34.3* 34.6* 32.0* 33.6*  MCV 89.5 90.1 89.9 90.5 90.1 89.9 90.3  PLT 453* 433* 406* 462* 490* 454* 460*   CBG:  Recent Labs Lab 09/26/12 2055  09/27/12 0003 09/27/12 0509 09/27/12 0727 09/27/12 1139  GLUCAP 139* 140* 109* 116* 156*   Microbiology Recent Results (from the past 240 hour(s))  URINE CULTURE     Status: None   Collection Time    09/20/12  4:52 PM      Result Value Range Status   Specimen Description URINE, RANDOM   Final   Special Requests NONE   Final   Culture  Setup Time     Final   Value: 09/20/2012 22:14     Performed at [facility]     Final   Value: NO GROWTH     Performed at [facility]  Final   Value: NO GROWTH     Performed at [facility]   Report Status 09/21/2012 FINAL   Final  CULTURE, BLOOD (ROUTINE X 2)     Status: None   Collection Time    09/20/12  5:49 PM      Result Value Range Status   Specimen Description BLOOD RIGHT ARM   Final   Special Requests BOTTLES DRAWN AEROBIC AND ANAEROBIC   Final   Culture  Setup Time     Final   Value: 09/21/2012 03:01     Performed at [facility]   Culture     Final   Value: NO GROWTH 5 DAYS     Performed at [facility]   Report Status 09/27/2012 FINAL   Final  CULTURE, BLOOD (ROUTINE X 2)     Status: None   Collection Time    09/20/12  5:58 PM      Result Value Range Status   Specimen Description BLOOD RIGHT HAND   Final   Special Requests BOTTLES DRAWN AEROBIC AND ANAEROBIC   Final   Culture  Setup Time     Final   Value: 09/21/2012 03:01     Performed at [facility]   Culture     Final   Value: NO GROWTH 5 DAYS     Performed at [facility]   Report Status 09/27/2012 FINAL   Final  MRSA PCR SCREENING     Status: None   Collection Time    09/20/12 11:02 PM      Result Value Range Status   MRSA by PCR NEGATIVE  NEGATIVE Final   Comment:            The GeneXpert MRSA Assay (FDA     approved for NASAL specimens     only), is one component of a     comprehensive MRSA colonization     surveillance program. It is not     intended to diagnose  MRSA     infection nor to guide or     monitor treatment for     MRSA infections.  AFB CULTURE WITH SMEAR     Status: None   Collection Time    09/24/12  8:35 AM      Result Value Range Status   Specimen Description BRONCHIAL ALVEOLAR LAVAGE   Final   Special Requests NONE   Final   ACID FAST SMEAR     Final   Value: NO ACID FAST BACILLI SEEN     Performed at [facility]   Culture     Final   Value: CULTURE WILL BE EXAMINED FOR 6 WEEKS BEFORE ISSUING A FINAL REPORT     Performed at [facility]   Report Status PENDING   Incomplete  CULTURE, BAL-QUANTITATIVE     Status: None   Collection Time    09/24/12  8:35 AM      Result Value Range Status   Specimen Description BRONCHIAL ALVEOLAR LAVAGE   Final   Special Requests NONE   Final   Gram Stain     Final   Value: ABUNDANT WBC PRESENT,BOTH PMN AND MONONUCLEAR     NO SQUAMOUS EPITHELIAL CELLS SEEN     NO ORGANISMS SEEN     Performed at [facility]     Final   Value: 45,000 COLONIES/ML     Performed at [facility]   Culture     Final   Value: Non-Pathogenic Oropharyngeal-type Flora Isolated.     Performed at [facility]   Report Status 09/26/2012 FINAL   Final  FUNGUS CULTURE W SMEAR     Status: None   Collection Time    09/24/12  8:35 AM      Result Value Range Status   Specimen Description BRONCHIAL ALVEOLAR LAVAGE   Final   Special Requests NONE   Final   Fungal Smear     Final   Value: NO YEAST OR FUNGAL ELEMENTS SEEN     Performed at [facility]   Culture     Final   Value: CULTURE IN PROGRESS FOR FOUR WEEKS     Performed at [facility]   Report Status PENDING   Incomplete  URINE CULTURE     Status: None   Collection Time    09/24/12  3:08 PM      Result Value Range Status   Specimen Description URINE, RANDOM   Final   Special Requests NONE   Final   Culture  Setup Time     Final   Value: 09/24/2012 23:08     Performed at [facility]     Final   Value: NO GROWTH     Performed at [facility]   Culture     Final   Value: NO GROWTH     Performed at [facility]   Report Status 09/25/2012 FINAL   Final    Time coordinating discharge: 35 minutes.  Signed:  RAMA,CHRISTINA  Pager [phone] [facility] 09/27/2012, 1:34 PM

## 2012-09-27 NOTE — Progress Notes (Signed)
CRITICAL VALUE ALERT  Critical value received:  C02:40  Date of notification:  09/27/12  Time of notification:  0436am  Critical value read back:yes  Nurse who received alert:  Kenton Kingfisher  MD notified (1st page):  N/A (Lab values consistent with what they have been)  Time of first page:  N/A  MD notified (2nd page): N/A  Time of second page: N/A  Responding MD:  N/A; consistent lab values  Time MD responded:  N/A

## 2012-09-27 NOTE — Progress Notes (Signed)
ANTIBIOTIC CONSULT NOTE - Follow Up  Pharmacy Consult for:  Vancomycin Indication:  Suspected pneumonia (HCAP vs aspiration PNA)  Allergies  Allergen Reactions  . Ativan [Lorazepam]    Patient Measurements: Height: 5\' 9"  (175.3 cm) Weight: 166 lb 7.2 oz (75.5 kg) IBW/kg (Calculated) : 70.7  Vital Signs: Temp: 98.7 F (37.1 C) (08/15 0519) Temp src: Oral (08/15 0519) BP: 143/58 mmHg (08/15 1027) Pulse Rate: 71 (08/15 1027)  Labs:  Recent Labs  09/25/12 0325 09/26/12 0350 09/27/12 0340  WBC 9.5 9.4  --   HGB 11.0* 11.5*  --   PLT 454* 460*  --   CREATININE 0.60 0.64 0.62   Estimated Creatinine Clearance: 73.6 ml/min (by C-G formula based on Cr of 0.62).   Medications:  Scheduled:  . acidophilus  1 capsule Oral Daily  . aspirin  81 mg Oral Daily  . bisacodyl  10 mg Rectal Daily  . ceFEPime (MAXIPIME) IV  1 g Intravenous Q8H  . citalopram  20 mg Oral Daily  . donepezil  10 mg Oral QHS  . felodipine  5 mg Oral Daily  . gabapentin  100 mg Oral TID  . haloperidol  0.5 mg Oral BID  . hydrochlorothiazide  25 mg Oral Daily  . memantine  5 mg Oral BID  . metFORMIN  500 mg Oral Q breakfast  . predniSONE  10 mg Oral Q breakfast  . Valproic Acid  500 mg Per Tube BID  . vancomycin  1,000 mg Intravenous Q12H   Assessment:  Completed 7/8 days Vancomycin/Cefepime for HCAP vs aspiration PNA  Leukocytosis resolved, afebrile, renal function stable, Vanc trough 8/11 in range (14.0)  Vanc trough ordered by MD this am = 15.0 mcg/ml  Last day abx is tomorrow 8/16  Goal of Therapy:  Vancomycin trough levels 15-20 mcg/ml Eradication of infection  Plan:   Continue Vancomycin 1gm q12, Cefepime 1gm q8hr with last doses 8/16  Otho Bellows PharmD Pager [phone] 09/27/2012, 10:38 AM

## 2012-09-27 NOTE — Consult Note (Signed)
Patient ON:Joel Summers      DOB: [birth year]      UXL:[id number]     Consult Note from the Palliative Medicine Team at [facility]    Consult Requested by: Dr Joel Summers     PCP: Joel Parker, MD Reason for Consultation:Clarifciation of GOC and options     Phone Number:None  Assessment of patients Current state: 79 y/o WM from nursing home, adm for SOB, cough, and abn CXR w/ extensive right sided changes from hx lung cancer & treatment.  Continued weakness and overall failure to thrive  CT chest was significant for possible aspiration in addition to debris within the right-sided endobronchial tree with bibasilar opacities, right-sided pulmonary process which was primarily felt to be treatment/radiation related. No convincing evidence of dominant residual or recurrent primary tumor. PCCM consulted and he underwent bronchoscopy with lavage 09/25/2012. Per swallow evaluation, pt is at high risk of aspiration and is currently NPO, status post PEG tube placement 09/26/2012.  Decisions related to AD and anticipatory needs in progress     Consult is for review of medical treatment options, clarification of goals of care and end of life issues, disposition and options, and symptom recommendation.  This NP Joel Summers reviewed medical records, received report from team, assessed the patient and then meet at the patient's bedside along with Legal guardian representative Joel Summers # [phone]  to discuss diagnosis prognosis, GOC, EOL wishes disposition and options.  (patient is a ward of the state with [facility])  A detailed discussion was had today regarding advanced directives.  Concepts specific to code status, artifical feeding and hydration, continued IV antibiotics and rehospitalization was had.  The difference between a aggressive medical intervention path  and a palliative comfort care path for this patient at this time was had.  Values and goals of care important to  patient and family were attempted to be elicited.  Concept of Hospice and Palliative Care were discussed.  Discussed importance of securing a MOST form  Natural trajectory and expectations at EOL were discussed.  Questions and concerns addressed.  Hard Choices booklet left for review. Family encouraged to call with questions or concerns.  PMT will continue to support holistically.   Goals of Care: 1.  Code Status: DNR/DNI   2. Scope of Treatment:  Treat the treatable 1. Vital Signs: per unit  2. Respiratory/Oxygen:as needed 3. Nutritional Support/Tube Feeds: now with PEG, continue 4. Antibiotics:yes 5. Blood Products:yes 6. IVF:yes 7. Labs:yes    4. Disposition: To SNF today if medically stable, open to Palliative Services.  SW working on placement   3. Symptom Management:   1. Anxiety/Agitation:          -Celexa 20 mg daily          -Aricept 10 mg daily          -Namenda- 5 mg bid          -Haldol-0.5 mg daily          -Depakote 500 mg bid 2. Pain: Tylenol  3. Bowel Regimen: MOM         Patient Documents Completed or Given: Document Given Completed  Advanced Directives Pkt    MOST yes   DNR    Gone from [facility]    Hard Choices  yes    Brief HPI: 79 y/o WM from nursing home, adm for SOB, cough, and abn CXR w/ extensive right sided changes from hx lung cancer & treatment  CT chest was significant for possible aspiration in addition to debris within the right-sided endobronchial tree with bibasilar opacities, right-sided pulmonary process which was primarily felt to be treatment/radiation related. No convincing evidence of dominant residual or recurrent primary tumor. PCCM consulted and he underwent bronchoscopy with lavage 09/25/2012. Per swallow evaluation, pt is at high risk of aspiration and is currently NPO, status post PEG tube placement 09/26/2012.  Decisions related to AD and anticipatory needs in progress     ROS: unable to illicit    PMH:  Past  Medical History  Diagnosis Date  . Altered mental status   . COPD (chronic obstructive pulmonary disease)   . Diabetes mellitus without complication   . Hypertension   . Cancer   . Lung cancer      PSH: Past Surgical History  Procedure Laterality Date  . Video bronchoscopy Bilateral 09/24/2012    Procedure: VIDEO BRONCHOSCOPY WITHOUT FLUORO;  Surgeon: Oretha Milch, MD;  Location: [facility];  Service: Cardiopulmonary;  Laterality: Bilateral;   I have reviewed the FH and SH and  If appropriate update it with new information. Allergies  Allergen Reactions  . Ativan [Lorazepam]    Scheduled Meds: . acidophilus  1 capsule Oral Daily  . aspirin  81 mg Oral Daily  . bisacodyl  10 mg Rectal Daily  . ceFEPime (MAXIPIME) IV  1 g Intravenous Q8H  . citalopram  20 mg Oral Daily  . donepezil  10 mg Oral QHS  . felodipine  5 mg Oral Daily  . gabapentin  100 mg Oral TID  . haloperidol  0.5 mg Oral BID  . hydrochlorothiazide  25 mg Oral Daily  . memantine  5 mg Oral BID  . metFORMIN  500 mg Oral Q breakfast  . predniSONE  10 mg Oral Q breakfast  . Valproic Acid  500 mg Per Tube BID  . vancomycin  1,000 mg Intravenous Q12H   Continuous Infusions: . 0.9 % NaCl with KCl 40 mEq / L 75 mL/hr at 09/27/12 0247  . dextrose 5 % and 0.9% NaCl 100 mL/hr at 09/23/12 1937   PRN Meds:.acetaminophen, acetaminophen, albuterol, fentaNYL, guaiFENesin, HYDROcodone-acetaminophen, ipratropium, lidocaine, lidocaine, loperamide, magnesium hydroxide, ondansetron (ZOFRAN) IV, ondansetron, phenylephrine    BP 143/57  Pulse 87  Temp(Src) 98.7 F (37.1 C) (Oral)  Resp 20  Ht 5\' 9"  (1.753 m)  Wt 75.5 kg (166 lb 7.2 oz)  BMI 24.57 kg/m2  SpO2 91%   PPS:30 %   Intake/Output Summary (Last 24 hours) at 09/27/12 0846 Last data filed at 09/27/12 0521  Gross per 24 hour  Intake 2242.5 ml  Output   1426 ml  Net  816.5 ml    Physical Exam:  General: chronically ill appearing, NAD HEENT:  Mm, no  exudate noted Chest:   Diminished in bases CTA CVS: RRR Abdomen:soft NT +BS, PEG site noted WNL Ext: without edema Neuro: oriented to person and place, able to follow simple commands  Labs: CBC    Component Value Date/Time   WBC 9.4 09/26/2012 0350   RBC 3.72* 09/26/2012 0350   HGB 11.5* 09/26/2012 0350   HCT 33.6* 09/26/2012 0350   PLT 460* 09/26/2012 0350   MCV 90.3 09/26/2012 0350   MCH 30.9 09/26/2012 0350   MCHC 34.2 09/26/2012 0350   RDW 13.9 09/26/2012 0350   LYMPHSABS 1.3 09/20/2012 2010   MONOABS 2.3* 09/20/2012 2010   EOSABS 0.0 09/20/2012 2010   BASOSABS 0.0 09/20/2012 2010  BMET    Component Value Date/Time   NA 137 09/27/2012 0340   K 3.6 09/27/2012 0340   CL 95* 09/27/2012 0340   CO2 40* 09/27/2012 0340   GLUCOSE 120* 09/27/2012 0340   BUN 8 09/27/2012 0340   CREATININE 0.62 09/27/2012 0340   CALCIUM 8.6 09/27/2012 0340   GFRNONAA >90 09/27/2012 0340   GFRAA >90 09/27/2012 0340    CMP     Component Value Date/Time   NA 137 09/27/2012 0340   K 3.6 09/27/2012 0340   CL 95* 09/27/2012 0340   CO2 40* 09/27/2012 0340   GLUCOSE 120* 09/27/2012 0340   BUN 8 09/27/2012 0340   CREATININE 0.62 09/27/2012 0340   CALCIUM 8.6 09/27/2012 0340   PROT 6.1 09/21/2012 0352   ALBUMIN 2.2* 09/21/2012 0352   AST 21 09/21/2012 0352   ALT 16 09/21/2012 0352   ALKPHOS 93 09/21/2012 0352   BILITOT 0.3 09/21/2012 0352   GFRNONAA >90 09/27/2012 0340   GFRAA >90 09/27/2012 0340      Time In Time Out Total Time Spent with Patient Total Overall Time  0745 0900 70 min 75 min    Greater than 50%  of this time was spent counseling and coordinating care related to the above assessment and plan.  Joel Creed NP  Palliative Medicine Team Team Phone # [phone] Pager [phone]  Guardian encouraged to call with questions or concerns  Discussed with Elissa Lovett LCSW

## 2012-09-27 NOTE — Progress Notes (Signed)
09/27/12 1534  Called Report to nurse Renae Fickle at [phone] for Rm [phone]

## 2012-09-27 NOTE — Progress Notes (Signed)
[facility] PROGRESS NOTE  Joel Summers ZOX:[id number] DOB: 1930-12-09 DOA: 09/20/2012 PCP: Ron Parker, MD  Brief narrative: Joel Summers is an 79 y.o. male with a PMH of hypertension, dementia, COPD, diabetes who was admitted on 09/20/2012 with acute respiratory failure.  CXR revealed right upper lung lobe mass consistent with history of lung cancer.  No previous records of history of lung cancer or treatments. CT chest was significant for possible aspiration in addition to debris within the right-sided endobronchial tree with bibasilar opacities, right-sided pulmonary process which was primarily felt to be treatment/radiation related. No convincing evidence of dominant residual or recurrent primary tumor. PCCM consulted and he underwent bronchoscopy with lavage 09/25/2012. Per swallow evaluation, pt is at high risk of aspiration and is currently NPO, status post PEG tube placement 09/26/2012.    Assessment/Plan: Principal Problem:  *Acute respiratory failure with hypoxia secondary to aspiration pneumonia  - Provided with supplemental oxygen support, now weaned to nasal cannula.  - Felt to be from aspiration pneumonia/post obstructive penumonia related to lung mass/lung cancer. - Continue vanco and cefepime per PCCM recommendations. Blood and sputum cultures negative to date. Legionella pneumonia and strep pneumonia antigen was negative. - Status post pulmonary consultation and bronchoscopy with lavage 09/24/2012 with no subsequent complications; results from brushings negative for malignancy.  - Continue albuterol and Atrovent nebulizers as needed every 2 hours.  - Continue prednisone taper.  - Continue aspiration precautions. COPD (chronic obstructive pulmonary disease)  - COPD order set in place; COPD gold alert ordered.  - Management as above with steroids (PO), nebulizer treatments PRN and antibiotics.  Active Problems:  Anemia of chronic disease  - Hemoglobin stable with no  current indication for transfusion. Hypokalemia  - Monitor and supplement as needed. Magnesium okay. Potassium added to IV fluids which corrected potassium. Leukocytosis  - Likely due to steroids and pneumonia, resolved. Dementia with behavioral disturbances  - Continue aricept and namenda.  - Continue haldol 0.5 mg PO BID.  Lung mass  - Status post bronch with lavage 09/24/2012. Bronchial washings negative for malignancy. 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 CBGs 109-140. Diabetic neuropathy  - Continue gabapentin 100 mg PO TID.  HTN (hypertension)  - Continue Hctz and felodipine.  Severe protein calorie malnutrition  - NPO due to risk of aspiration.  - Status post swallow evaluation, patient felt to be at high risk of aspiration. - Peg tube placed 09/26/2012. Initiate tube feedings per dietitian recommendations.  Code Status: DNR/DNI  Family Communication: No family at the bedside. Disposition Plan: To SNF when stable.    Medical Consultants:  Dr. Cyril Mourning, Pulmonology  Dr. Abundio Miu, IR  Other Consultants:  Dietitian  Speech therapy  Anti-infectives:  Vancomycin 09/20/2012---> 09/28/2012  Cefepime 09/20/2012---> 09/28/2012  Rocephin 09/20/2012---> 09/20/2012  Azithromycin 09/20/2012---> 09/20/2012  HPI/Subjective: Joel Summers is presently confused. He denies pain at the PEG insertion site. No nausea or vomiting.  Objective: Filed Vitals:   09/26/12 1644 09/26/12 2050 09/27/12 0519 09/27/12 1027  BP: 163/78 138/58 143/57 143/58  Pulse: 71 87 87 71  Temp:  98.7 F (37.1 C) 98.7 F (37.1 C)   TempSrc:  Oral Oral   Resp: 13 20 20 20   Height:      Weight:      SpO2:  99% 91% 91%    Intake/Output Summary (Last 24 hours) at 09/27/12 1059 Last data filed at 09/27/12 0521  Gross per 24  hour  Intake 2242.5 ml  Output   1126 ml  Net 1116.5 ml    Exam: Gen:  NAD Cardiovascular:  RRR, No  M/R/G Respiratory:  Lungs CTAB Gastrointestinal:  Abdomen soft, NT/ND, + BS Extremities:  No C/E/C  Data Reviewed: Basic Metabolic Panel:  Recent Labs Lab 09/20/12 1510 09/20/12 2010  09/23/12 0354 09/24/12 0340 09/25/12 0325 09/26/12 0350 09/27/12 0340  NA 137 138  < > 137 136 136 138 137  K 3.9 3.6  < > 3.0* 2.5* 3.1* 3.0* 3.6  CL 95* 96  < > 96 94* 92* 93* 95*  CO2 31 33*  < > 36* 40* 41* 42* 40*  GLUCOSE 207* 158*  < > 240* 196* 172* 134* 120*  BUN 18 15  < > 9 5* 9 10 8   CREATININE 0.83 0.72  < > 0.58 0.55 0.60 0.64 0.62  CALCIUM 8.8 8.9  < > 8.8 8.9 8.6 8.8 8.6  MG  --  1.8  --   --   --   --  1.7  --   PHOS  --  2.9  --   --   --   --   --   --   < > = values in this interval not displayed. GFR Estimated Creatinine Clearance: 73.6 ml/min (by C-G formula based on Cr of 0.62). Liver Function Tests:  Recent Labs Lab 09/20/12 1510 09/20/12 2010 09/21/12 0352  AST 19 18 21   ALT 19 18 16   ALKPHOS 91 92 93  BILITOT 0.5 0.3 0.3  PROT 6.5 6.6 6.1  ALBUMIN 2.4* 2.4* 2.2*   Coagulation profile  Recent Labs Lab 09/20/12 2010 09/26/12 0350  INR 1.28 1.26    CBC:  Recent Labs Lab 09/20/12 1510 09/20/12 2010 09/21/12 0352 09/23/12 0354 09/24/12 0340 09/25/12 0325 09/26/12 0350  WBC 18.8* 20.9* 18.4* 15.2* 10.4 9.5 9.4  NEUTROABS 15.8* 17.3*  --   --   --   --   --   HGB 11.4* 11.5* 11.0* 11.7* 11.7* 11.0* 11.5*  HCT 33.2* 33.7* 31.9* 34.3* 34.6* 32.0* 33.6*  MCV 89.5 90.1 89.9 90.5 90.1 89.9 90.3  PLT 453* 433* 406* 462* 490* 454* 460*   CBG:  Recent Labs Lab 09/26/12 1741 09/26/12 2055 09/27/12 0003 09/27/12 0509 09/27/12 0727  GLUCAP 109* 139* 140* 109* 116*   Microbiology Recent Results (from the past 240 hour(s))  URINE CULTURE     Status: None   Collection Time    09/20/12  4:52 PM      Result Value Range Status   Specimen Description URINE, RANDOM   Final   Special Requests NONE   Final   Culture  Setup Time     Final   Value:  09/20/2012 22:14     Performed at [facility] Count     Final   Value: NO GROWTH     Performed at [facility]   Culture     Final   Value: NO GROWTH     Performed at [facility]   Report Status 09/21/2012 FINAL   Final  CULTURE, BLOOD (ROUTINE X 2)     Status: None   Collection Time    09/20/12  5:49 PM      Result Value Range Status   Specimen Description BLOOD RIGHT ARM   Final   Special Requests BOTTLES DRAWN AEROBIC AND ANAEROBIC   Final   Culture  Setup Time  Final   Value: 09/21/2012 03:01     Performed at [facility]   Culture     Final   Value: NO GROWTH 5 DAYS     Performed at [facility]   Report Status 09/27/2012 FINAL   Final  CULTURE, BLOOD (ROUTINE X 2)     Status: None   Collection Time    09/20/12  5:58 PM      Result Value Range Status   Specimen Description BLOOD RIGHT HAND   Final   Special Requests BOTTLES DRAWN AEROBIC AND ANAEROBIC   Final   Culture  Setup Time     Final   Value: 09/21/2012 03:01     Performed at [facility]   Culture     Final   Value: NO GROWTH 5 DAYS     Performed at [facility]   Report Status 09/27/2012 FINAL   Final  MRSA PCR SCREENING     Status: None   Collection Time    09/20/12 11:02 PM      Result Value Range Status   MRSA by PCR NEGATIVE  NEGATIVE Final   Comment:            The GeneXpert MRSA Assay (FDA     approved for NASAL specimens     only), is one component of a     comprehensive MRSA colonization     surveillance program. It is not     intended to diagnose MRSA     infection nor to guide or     monitor treatment for     MRSA infections.  AFB CULTURE WITH SMEAR     Status: None   Collection Time    09/24/12  8:35 AM      Result Value Range Status   Specimen Description BRONCHIAL ALVEOLAR LAVAGE   Final   Special Requests NONE   Final   ACID FAST SMEAR     Final   Value: NO ACID FAST BACILLI SEEN     Performed at [facility]   Culture     Final   Value: CULTURE WILL BE EXAMINED FOR 6 WEEKS BEFORE ISSUING A FINAL REPORT     Performed at [facility]   Report Status PENDING   Incomplete  CULTURE, BAL-QUANTITATIVE     Status: None   Collection Time    09/24/12  8:35 AM      Result Value Range Status   Specimen Description BRONCHIAL ALVEOLAR LAVAGE   Final   Special Requests NONE   Final   Gram Stain     Final   Value: ABUNDANT WBC PRESENT,BOTH PMN AND MONONUCLEAR     NO SQUAMOUS EPITHELIAL CELLS SEEN     NO ORGANISMS SEEN     Performed at [facility] Count     Final   Value: 45,000 COLONIES/ML     Performed at [facility]   Culture     Final   Value: Non-Pathogenic Oropharyngeal-type Flora Isolated.     Performed at [facility]   Report Status 09/26/2012 FINAL   Final  FUNGUS CULTURE W SMEAR     Status: None   Collection Time    09/24/12  8:35 AM      Result Value Range Status   Specimen Description BRONCHIAL ALVEOLAR LAVAGE   Final   Special Requests NONE   Final   Fungal Smear  Final   Value: NO YEAST OR FUNGAL ELEMENTS SEEN     Performed at [facility]   Culture     Final   Value: CULTURE IN PROGRESS FOR FOUR WEEKS     Performed at [facility]   Report Status PENDING   Incomplete  URINE CULTURE     Status: None   Collection Time    09/24/12  3:08 PM      Result Value Range Status   Specimen Description URINE, RANDOM   Final   Special Requests NONE   Final   Culture  Setup Time     Final   Value: 09/24/2012 23:08     Performed at [facility] Count     Final   Value: NO GROWTH     Performed at [facility]   Culture     Final   Value: NO GROWTH     Performed at [facility]   Report Status 09/25/2012 FINAL   Final     Procedures and Diagnostic Studies: Dg Chest 2 View  09/20/2012   *RADIOLOGY REPORT*  Clinical Data: Fall, weakness, unable to walk well, the kidney, cough,  history lung cancer, hypertension, COPD  CHEST - 2 VIEW  Comparison: Exam at 1603 hours compared to earlier study of 0425 hours  Findings: Mild volume loss in right hemithorax again noted. Enlargement of cardiac silhouette with tortuous aorta. Pulmonary vascular congestion. Right perihilar opacity compatible with tumor and post-treatment changes. Bibasilar atelectasis versus infiltrates with small right pleural effusion. No pneumothorax. Bones demineralized.  IMPRESSION: Right perihilar opacity consistent with lung cancer and post- therapy changes. COPD changes with bibasilar atelectasis versus infiltrates and small right pleural effusion.   Original Report Authenticated By: Ulyses Southward, M.D.   Dg Chest 2 View  09/20/2012   *RADIOLOGY REPORT*  Clinical Data: Status post fall.  Chest pain.  History of lung cancer.  CHEST - 2 VIEW  Comparison: None.  Findings: There is volume loss in the right chest with a mass-like opacity in the right upper lobe.  Blunting of the right costophrenic angle could be due to a small effusion or scar. Surgical clips right hilum noted.  The left lung appears clear. Heart size is normal.  IMPRESSION: Post-treatment change right chest with a mass-like opacity right upper lobe compatible with history of lung cancer.  Small right pleural effusion.   Original Report Authenticated By: Holley Dexter, M.D.   Ct Head Wo Contrast  09/20/2012   *RADIOLOGY REPORT*  Clinical Data:  Status post fall with a blow to the head.  CT HEAD WITHOUT CONTRAST CT CERVICAL SPINE WITHOUT CONTRAST  Technique:  Multidetector CT imaging of the head and cervical spine was performed following the standard protocol without intravenous contrast.  Multiplanar CT image reconstructions of the cervical spine were also generated.  Comparison:  None.  CT HEAD  Findings: The brain is atrophic with chronic microvascular ischemic change.  No acute intracranial abnormality including infarction, hemorrhage, mass lesion, mass  effect, midline shift or abnormal extra-axial fluid collection is identified.  Hematoma over the frontal bone is noted.  No fracture is seen.  IMPRESSION:  1.  Hematoma over the frontal bone without underlying fracture or for acute intracranial abnormality. 2.  Atrophy and chronic microvascular ischemic change.  CT CERVICAL SPINE  Findings: No fracture or subluxation of the cervical spine is identified. Loss of disc space height and endplate spurring are most notable at  C5-6 and C6-7.  Multilevel facet degenerative change is worse on the left.  Paraspinous soft tissue structures are unremarkable.  Apical scarring on the right is noted.  IMPRESSION: No acute finding.  Degenerative disc disease C5-6 and C6-7.   Original Report Authenticated By: Holley Dexter, M.D.   Ct Chest W Contrast  09/21/2012   *RADIOLOGY REPORT*  Clinical Data: Evaluate right upper lobe lung mass. History lung cancer.  Elevated white blood cells.  Acute respiratory distress. History of dementia.  CT CHEST WITH CONTRAST  Technique:  Multidetector CT imaging of the chest was performed following the standard protocol during bolus administration of intravenous contrast.  Contrast: 80mL OMNIPAQUE IOHEXOL 300 MG/ML  SOLN  Comparison: Plain film of 09/20/2012.  No prior CT.  Findings: Lungs/pleura: Mild motion degradation.  Material in the right mainstem bronchus on image 38/series 6 and lower lobe bronchi on image 43/series 6.  Right greater than left patchy bibasilar airspace disease.  Right suprahilar confluent pulmonary opacity with bronchiectasis.  This is identified lateral and anterior to surgical sutures, including on image 39/series 6.  Trace left pleural fluid.  Small right-sided pleural effusion with minimal wall thickening or loculation anteriorly.  Surgical sutures about right lower lobe bronchi.  Heart/Mediastinum: Mild cardiomegaly.  Coronary artery atherosclerosis.  Main pulmonary artery enlargement. No central pulmonary embolism,  on this non-dedicated study.  Mediastinal deviation to the right with volume loss in the right hemithorax.  Right paratracheal node 1.2 cm on image 28. Subcarinal node 1.7 cm on image 41.  Upper abdomen: Fatty atrophy of the pancreas.  Normal imaged adrenal glands.  Bones/Musculoskeletal:  Postsurgical or post-traumatic defects of right-sided ribs.  IMPRESSION:  1.  Findings which are highly suspicious for aspiration.  Debris within the right-sided endobronchial tree with bibasilar, right greater than left patchy airspace opacities. 2.  Right-sided pulmonary process which is primarily felt to be treatment/radiation related.  No convincing evidence of dominant residual or recurrent primary tumor.  CT follow-up and consideration of eventual PET may be informative.  In addition, comparison with prior exams would be useful. 3.  Thoracic adenopathy, for which which metastatic disease is suspected.  Alternatively, these nodes could be reactive. 4.  Right greater than left bilateral pleural effusions. 5. Pulmonary artery enlargement suggests pulmonary arterial hypertension.   Original Report Authenticated By: Jeronimo Greaves, M.D.   Ct Cervical Spine Wo Contrast  09/20/2012   *RADIOLOGY REPORT*  Clinical Data:  Status post fall with a blow to the head.  CT HEAD WITHOUT CONTRAST CT CERVICAL SPINE WITHOUT CONTRAST  Technique:  Multidetector CT imaging of the head and cervical spine was performed following the standard protocol without intravenous contrast.  Multiplanar CT image reconstructions of the cervical spine were also generated.  Comparison:  None.  CT HEAD  Findings: The brain is atrophic with chronic microvascular ischemic change.  No acute intracranial abnormality including infarction, hemorrhage, mass lesion, mass effect, midline shift or abnormal extra-axial fluid collection is identified.  Hematoma over the frontal bone is noted.  No fracture is seen.  IMPRESSION:  1.  Hematoma over the frontal bone without  underlying fracture or for acute intracranial abnormality. 2.  Atrophy and chronic microvascular ischemic change.  CT CERVICAL SPINE  Findings: No fracture or subluxation of the cervical spine is identified. Loss of disc space height and endplate spurring are most notable at C5-6 and C6-7.  Multilevel facet degenerative change is worse on the left.  Paraspinous soft tissue structures are unremarkable.  Apical scarring on the right is noted.  IMPRESSION: No acute finding.  Degenerative disc disease C5-6 and C6-7.   Original Report Authenticated By: Holley Dexter, M.D.   Dg Chest Port 1 View  09/24/2012   *RADIOLOGY REPORT*  Clinical Data: Post bronchoscopy  PORTABLE CHEST - 1 VIEW  Comparison: Study obtained earlier in the day.  Findings:  No pneumothorax.  There is scarring in the right mid lung with retraction and volume loss.  There is apical pleural thickening on the right.  There is scarring in the left base as well.  There is no new opacity.  Heart size and pulmonary vascularity are within normal limits given the scarring and retraction on the right.  There is underlying emphysema.  IMPRESSION: No appreciable change compared to study obtained earlier in the day.  No pneumothorax.   Original Report Authenticated By: Bretta Bang, M.D.   Dg Chest Port 1 View  09/24/2012   *RADIOLOGY REPORT*  Clinical Data: Follow up airspace disease  PORTABLE CHEST - 1 VIEW  Comparison: 09/20/2012  Findings: Right perihilar density unchanged.  Right pleural effusion/pleural scarring unchanged.  There is right lower lobe collapse which has developed in the interval.  This may be due to endobronchial plugging from mucus or aspiration.  Mild left lower lobe infiltrate unchanged.  No effusion on the left.  Underlying COPD.  IMPRESSION: Right lower lobe collapse has occurred in the interval.  There is a small right effusion/pleural scarring.  Mild left lower lobe infiltrate is unchanged.   Original Report Authenticated  By: Janeece Riggers, M.D.   Dg Swallowing Func-speech Pathology  09/23/2012   Chales Abrahams, CCC-SLP     09/23/2012  3:21 PM Objective Swallowing Evaluation: Modified Barium Swallowing Study   Patient Details  Name: JULIAS MOULD MRN: [id number] Date of Birth: 06/29/1930  Today's Date: 09/23/2012 Time: 0865-7846 SLP Time Calculation (min): 30 min  Past Medical History:  Past Medical History  Diagnosis Date  . Altered mental status   . COPD (chronic obstructive pulmonary disease)   . Diabetes mellitus without complication   . Hypertension   . Cancer   . Lung cancer    Past Surgical History: History reviewed. No pertinent past  surgical history. HPI:  79 yo resident of ALF [facility])  admitted to [facility]  after falling-pt found to have pna.  PMH + for HTN, COPD,  dementia.  Pt CT head showed hematoma over frontal bone, atrophy.   Pt with possible debris in right lung concerning for aspiration.   Pt denies dysphagia but given his dementia, uncertain of ability  to recall information.  BSE completed with indications for MBS  and order was obtained.       Assessment / Plan / Recommendation Clinical Impression  Dysphagia Diagnosis: Moderate oral phase dysphagia;Moderate  pharyngeal phase dysphagia (suspect esophageal component as well)   Clinical impression: Pt presents with moderate oropharyngeal  sensorimotor dysphagia with decreased oral bolus containment from  weakness resulting in premature spillage of barium into pharynx  and oral residuals (mostly with liquids) without pt sensation.   Pt's pharyngeal swallow characterized by delayed initiation  (cracker accumulating in vallecular space for 4 seconds prior to  swallow) and liquids spilled to pyriform sinus prior to swallow.   Decreased tongue base retraction, laryngeal elevation, pharyngeal  contraction result in pharyngeal stasis with liquids and barium  tablet WITHOUT awareness and laryngeal penetration of both  nectar/thin consistencies.  Chin  tuck not helpful and  cued cough  was WEAK and did not clear penetrates.  Barium tablet given with  pudding precariously lodged at vallecular space without pt  awareness, thin aided clearance but with penetration.   Given pt's increased dyspenia with effort, delayed swallow  responses, no awareness to stasis and weak nonprotective cough,  he is deemed a high aspiration risk with po.  Suspect pt is  aspirating secretions as barium mixed with secretions from oral  cavity and were penetrated.  Skilled intervention included  educating pt to findings via video monitor, providing visual and  verbal feedback to compensation strategies.    Options may be to allow po with known risk and strategies to  mitigate risk or keep npo except medicine crushed with applesauce  and ice prn after oral care.   Would need repeat MBS after  medical/respiratory improvement due to sensory motor deficits.   Unfortunately pt coughed throughout entire MBS making clinical  evaluation inaccurate and pt's cough is weak and nonprotective.    SLP phoned ALF after MBS and spoke to Gang Mills, resident care  Production designer, theatre/television/film, who reported pt was on a regular/thin diet and took  medications with water with good tolerance as far as she knew.   Grover Canavan stated pt did not have significant weight loss nor  pulmonary infections recently as far as she knew.         Treatment Recommendation  Therapy as outlined in treatment plan below    Diet Recommendation NPO;Ice chips PRN after oral care (vs po of  pureed/thin with risks accepted)   Medication Administration: Crushed with puree    Other  Recommendations Recommended Consults: MBS Oral Care Recommendations: Oral care BID   Follow Up Recommendations  Skilled Nursing facility    Frequency and Duration min 2x/week  2 weeks   Pertinent Vitals/Pain Afebrile, nonproductive congested cough, pt  unable to clear, occasional wet voice-? Asp of secretions    SLP Swallow Goals Patient will utilize recommended strategies during  swallow to  increase swallowing safety with: Total assistance Goal #3: Pt and MD will determine if desire for pt to eat with  known aspiration risk and strategies to mitigate it.     General HPI: 79 yo resident of ALF [facility])   admitted to [facility] after falling-pt found to have pna.  PMH + for  HTN, COPD, dementia.  Pt CT head showed hematoma over frontal  bone, atrophy.  Pt with possible debris in right lung concerning  for aspiration.  Pt denies dysphagia but given his dementia,  uncertain of ability to recall information.  BSE completed with  indications for MBS and order was obtained.   Type of Study: Modified Barium Swallowing Study Reason for Referral: Objectively evaluate swallowing function Previous Swallow Assessment: none in epic Diet Prior to this Study: NPO Temperature Spikes Noted: No Respiratory Status: Supplemental O2 delivered via (comment) History of Recent Intubation: No Behavior/Cognition:  Alert;Cooperative;Confused;Impulsive;Distractible;Decreased  sustained attention Oral Cavity - Dentition: Edentulous Oral Motor / Sensory Function: Impaired - see Bedside swallow  eval Self-Feeding Abilities: Needs assist Patient Positioning: Upright in chair Baseline Vocal Quality: Low vocal intensity Volitional Cough: Weak;Congested Volitional Swallow: Able to elicit Anatomy: Within functional limits Pharyngeal Secretions: Standing secretions in (comment)    Reason for Referral Objectively evaluate swallowing function   Oral Phase Oral Preparation/Oral Phase Oral Phase: Impaired Oral - Nectar Oral - Nectar Teaspoon: Reduced posterior propulsion;Piecemeal  swallowing;Delayed oral transit;Weak lingual manipulation Oral - Nectar Cup: Reduced posterior propulsion;Piecemeal  swallowing;Delayed  oral transit;Weak lingual manipulation Oral - Thin Oral - Thin Teaspoon: Delayed oral transit;Piecemeal  swallowing;Weak lingual manipulation Oral - Thin Cup: Piecemeal swallowing;Reduced posterior   propulsion;Weak lingual manipulation;Lingual/palatal residue Oral - Thin Straw: Piecemeal swallowing;Reduced posterior  propulsion;Weak lingual manipulation;Lingual/palatal residue Oral - Solids Oral - Puree: Reduced posterior propulsion;Delayed oral  transit;Weak lingual manipulation Oral - Regular: Delayed oral transit;Impaired mastication;Reduced  posterior propulsion;Lingual/palatal residue;Weak lingual  manipulation Oral - Pill: Reduced posterior propulsion;Delayed oral  transit;Weak lingual manipulation Oral Phase - Comment Oral Phase - Comment: oral residuals prematurely spill into  pharynx without pt awareness- at times pt required cues to  swallow and response was delayed   Pharyngeal Phase Pharyngeal Phase Pharyngeal Phase: Impaired Pharyngeal - Nectar Pharyngeal - Nectar Teaspoon: Delayed swallow  initiation;Pharyngeal residue - valleculae;Reduced laryngeal  elevation;Reduced airway/laryngeal closure;Reduced tongue base  retraction;Reduced pharyngeal peristalsis Pharyngeal - Nectar Cup: Delayed swallow initiation;Premature  spillage to pyriform sinuses;Reduced airway/laryngeal  closure;Reduced laryngeal elevation;Reduced tongue base  retraction;Pharyngeal residue - valleculae;Pharyngeal residue -  pyriform sinuses Pharyngeal - Thin Pharyngeal - Thin Teaspoon: Premature spillage to pyriform  sinuses;Reduced airway/laryngeal closure;Reduced laryngeal  elevation;Delayed swallow initiation;Reduced pharyngeal  peristalsis;Pharyngeal residue - valleculae (swallow triggered at  approx 2 seconds at vallecular space) Pharyngeal - Thin Cup: Premature spillage to valleculae;Delayed  swallow initiation;Premature spillage to pyriform  sinuses;Penetration/Aspiration during swallow;Reduced laryngeal  elevation;Reduced airway/laryngeal closure;Reduced tongue base  retraction;Reduced pharyngeal peristalsis;Pharyngeal residue -  valleculae Penetration/Aspiration details (thin cup): Material enters  airway, remains ABOVE  vocal cords and not ejected out Pharyngeal - Thin Straw: Delayed swallow initiation;Premature  spillage to pyriform sinuses;Penetration/Aspiration during  swallow;Penetration/Aspiration after swallow;Reduced  airway/laryngeal closure;Reduced laryngeal elevation;Reduced  pharyngeal peristalsis Penetration/Aspiration details (thin straw): Material enters  airway, remains ABOVE vocal cords and not ejected out Pharyngeal - Solids Pharyngeal - Puree: Delayed swallow initiation;Premature spillage  to valleculae (trace base of tongue stasis without awarness) Pharyngeal - Regular: Delayed swallow initiation;Premature  spillage to valleculae (delay up to 4 seconds filling vallecular  space) Pharyngeal - Pill: Delayed swallow initiation;Pharyngeal residue  - valleculae;Premature spillage to valleculae;Reduced tongue base  retraction;Reduced laryngeal elevation;Reduced airway/laryngeal  closure (pt did not sense barium tablet lodged in pharynx) Pharyngeal Phase - Comment Pharyngeal Comment: pt with delayed pharyngeal swallow initiation  and no awareness to tongue base and vallecular stasis, cued dry  swallows helpful but pt was delayed in performance and would  likely be taxing for pt with COPD, cued cough did not fully clear  trace penetration, chin tuck did not prevent penetration of thin  was difficult for pt to perform, nectar liquids resulted in more  stasis than thin, pharyngeal swallow strong with solid and puree  without stasis   Cervical Esophageal Phase    GO    Cervical Esophageal Phase Cervical Esophageal Phase: Impaired Cervical Esophageal Phase - Comment Cervical Esophageal Comment: After pt consumed a very small  amount of nectar barium *one cup sip and one tsp, pt appeared  with significant distal stasis without awareness that did not  clear, swallowing water boluses aided clearance; further boluses  of thin resulted in distal stasis - again requiring water bolus  to clear.  Pt appeared to clear puree, cracker  and barium tablet  in timely fashion.  Would recommend strict esophageal/reflux  precautions for this pt.         Donavan Burnet, MS [facility] SLP [phone]     Scheduled Meds: . acidophilus  1 capsule Oral Daily  . aspirin  81 mg Oral Daily  .  bisacodyl  10 mg Rectal Daily  . ceFEPime (MAXIPIME) IV  1 g Intravenous Q8H  . citalopram  20 mg Oral Daily  . donepezil  10 mg Oral QHS  . felodipine  5 mg Oral Daily  . gabapentin  100 mg Oral TID  . haloperidol  0.5 mg Oral BID  . hydrochlorothiazide  25 mg Oral Daily  . memantine  5 mg Oral BID  . metFORMIN  500 mg Oral Q breakfast  . predniSONE  10 mg Oral Q breakfast  . Valproic Acid  500 mg Per Tube BID  . vancomycin  1,000 mg Intravenous Q12H   Continuous Infusions: . 0.9 % NaCl with KCl 40 mEq / L 75 mL/hr at 09/27/12 0247  . dextrose 5 % and 0.9% NaCl 100 mL/hr at 09/23/12 1937  . feeding supplement (JEVITY 1.2 CAL)      Time spent: 25 minutes.   LOS: 7 days   RAMA,CHRISTINA  [facility] Pager [phone].   *Please note that the hospitalists switch teams on Wednesdays. Please call the flow manager at [phone] if you are having difficulty reaching the hospitalist taking care of this patient as she can update you and provide the most up-to-date pager number of provider caring for the patient. If 8PM-8AM, please contact night-coverage at [email or web address], password [facility]  09/27/2012, 10:59 AM

## 2012-09-27 NOTE — Progress Notes (Signed)
Clinical Social Work Department CLINICAL SOCIAL WORK PLACEMENT NOTE 09/27/2012  Patient:  Joel Summers, Joel Summers  Account Number:  [id number] Admit date:  09/20/2012  Clinical Social Worker:  Jacelyn Grip  Date/time:  09/27/2012 03:20 PM  Clinical Social Work is seeking post-discharge placement for this patient at the following level of care:   SKILLED NURSING   (*CSW will update this form in Epic as items are completed)   04/25/2012  Patient/family provided with [facility] Department of Clinical Social Work's list of facilities offering this level of care within the geographic area requested by the patient (or if unable, by the patient's family).  04/25/2012  Patient/family informed of their freedom to choose among providers that offer the needed level of care, that participate in Medicare, Medicaid or managed care program needed by the patient, have an available bed and are willing to accept the patient.  04/25/2012  Patient/family informed of [facility]' ownership interest in [facility], as well as of the fact that they are under no obligation to receive care at this facility.  PASARR submitted to EDS on 04/25/2012 PASARR number received from EDS on 04/26/2012  FL2 transmitted to all facilities in geographic area requested by pt/family on  04/25/2012 FL2 transmitted to all facilities within larger geographic area on   Patient informed that his/her managed care company has contracts with or will negotiate with  certain facilities, including the following:     Patient/family informed of bed offers received:  04/26/2012 [facility] at [facility] Physician recommends and patient chooses bed at    Patient to be transferred to [facility] on  09/27/2012 Patient to be transferred to facility by ambulance Sharin Mons)  The following physician request were entered in Epic:   Additional Comments:    Jacklynn Lewis, MSW, LCSWA  Clinical Social  Work [phone]

## 2012-09-27 NOTE — Progress Notes (Signed)
Nutrition Brief Note  Intervention: - Placed orders for TF to begin at 5pm tonight: Jevity 1.2 start at 4ml/hr increase by 10ml every 4 hours goal of 68ml/hr - this will provide 1872 calories, 87g protein, free water meeting 98% estimated calorie needs, 109% estimated protein. If IVF d/c, recommend water flushes 4 times/day.  - Ordered adult enteral protocol  - Will continue to monitor   Pt had PEG placed yesterday. Per conversation with radiology, PEG will be able to used 24 hours from placement which will be 5pm today. Discussed with MD who gave RD TF management privileges. TF orders placed.   Levon Hedger MS, RD, LDN [phone] Pager [phone] After Hours Pager

## 2012-10-14 NOTE — Consult Note (Signed)
I have reviewed and discussed the care of this patient in detail with the nurse practitioner including pertinent patient records, physical exam findings and data. I agree with details of this encounter.  

## 2012-10-17 ENCOUNTER — Ambulatory Visit: Payer: Self-pay | Admitting: Family Medicine

## 2012-10-21 LAB — FUNGUS CULTURE W SMEAR: Fungal Smear: NONE SEEN

## 2012-10-31 ENCOUNTER — Emergency Department: Payer: Self-pay | Admitting: Emergency Medicine

## 2012-10-31 LAB — CBC
HGB: 11.8 g/dL — ABNORMAL LOW (ref 13.0–18.0)
MCV: 91 fL (ref 80–100)

## 2012-10-31 LAB — URINALYSIS, COMPLETE
Bilirubin,UR: NEGATIVE
Glucose,UR: NEGATIVE mg/dL (ref 0–75)
Leukocyte Esterase: NEGATIVE
Nitrite: NEGATIVE
Ph: 8 (ref 4.5–8.0)
RBC,UR: 6 /HPF (ref 0–5)
Specific Gravity: 1.005 (ref 1.003–1.030)
Squamous Epithelial: NONE SEEN
WBC UR: 1 /HPF (ref 0–5)

## 2012-10-31 LAB — COMPREHENSIVE METABOLIC PANEL
Alkaline Phosphatase: 72 U/L (ref 50–136)
BUN: 14 mg/dL (ref 7–18)
Creatinine: 0.68 mg/dL (ref 0.60–1.30)
EGFR (African American): >60
Glucose: 129 mg/dL — ABNORMAL HIGH (ref 65–99)
Osmolality: 263 (ref 275–301)
Potassium: 4 mmol/L (ref 3.5–5.1)
Sodium: 130 mmol/L — ABNORMAL LOW (ref 136–145)
Total Protein: 6.7 g/dL (ref 6.4–8.2)

## 2012-10-31 LAB — PROTIME-INR: INR: 1

## 2012-11-06 LAB — AFB CULTURE WITH SMEAR (NOT AT ARMC): Acid Fast Smear: NONE SEEN

## 2012-11-08 ENCOUNTER — Other Ambulatory Visit: Payer: Self-pay | Admitting: Family Medicine

## 2012-11-08 LAB — URINALYSIS, COMPLETE
Bacteria: NONE SEEN
Leukocyte Esterase: NEGATIVE
Protein: 30
Transitional Epi: <1
WBC UR: 2 /HPF (ref 0–5)

## 2013-03-13 ENCOUNTER — Inpatient Hospital Stay: Payer: Self-pay | Admitting: Internal Medicine

## 2013-03-13 LAB — URINALYSIS, COMPLETE
BACTERIA: NONE SEEN
BILIRUBIN, UR: NEGATIVE
Blood: NEGATIVE
Glucose,UR: NEGATIVE mg/dL (ref 0–75)
Ketone: NEGATIVE
Nitrite: NEGATIVE
Ph: 7 (ref 4.5–8.0)
Specific Gravity: 1.015 (ref 1.003–1.030)
WBC UR: 106 /HPF (ref 0–5)

## 2013-03-13 LAB — CBC WITH DIFFERENTIAL/PLATELET
BASOS ABS: 0.1 10*3/uL (ref 0.0–0.1)
BASOS PCT: 0.7 %
Eosinophil #: 0 10*3/uL (ref 0.0–0.7)
Eosinophil %: 0.4 %
HCT: 35.1 % — ABNORMAL LOW (ref 40.0–52.0)
HGB: 11.9 g/dL — ABNORMAL LOW (ref 13.0–18.0)
LYMPHS PCT: 14 %
Lymphocyte #: 1 10*3/uL (ref 1.0–3.6)
MCH: 30.3 pg (ref 26.0–34.0)
MCHC: 33.8 g/dL (ref 32.0–36.0)
MCV: 90 fL (ref 80–100)
Monocyte #: 1.3 x10 3/mm — ABNORMAL HIGH (ref 0.2–1.0)
Monocyte %: 17.6 %
NEUTROS PCT: 67.3 %
Neutrophil #: 4.8 10*3/uL (ref 1.4–6.5)
Platelet: 339 10*3/uL (ref 150–440)
RBC: 3.92 10*6/uL — ABNORMAL LOW (ref 4.40–5.90)
RDW: 14.5 % (ref 11.5–14.5)
WBC: 7.2 10*3/uL (ref 3.8–10.6)

## 2013-03-13 LAB — COMPREHENSIVE METABOLIC PANEL
ALK PHOS: 86 U/L
ANION GAP: 4 — AB (ref 7–16)
Albumin: 2.4 g/dL — ABNORMAL LOW (ref 3.4–5.0)
BUN: 23 mg/dL — ABNORMAL HIGH (ref 7–18)
Bilirubin,Total: 0.2 mg/dL (ref 0.2–1.0)
CO2: 30 mmol/L (ref 21–32)
CREATININE: 1.2 mg/dL (ref 0.60–1.30)
Calcium, Total: 8.5 mg/dL (ref 8.5–10.1)
Chloride: 103 mmol/L (ref 98–107)
EGFR (African American): >60
EGFR (Non-African Amer.): 57 — ABNORMAL LOW
GLUCOSE: 119 mg/dL — AB (ref 65–99)
Osmolality: 279 (ref 275–301)
Potassium: 4.2 mmol/L (ref 3.5–5.1)
SGOT(AST): 24 U/L (ref 15–37)
SGPT (ALT): 27 U/L (ref 12–78)
SODIUM: 137 mmol/L (ref 136–145)
Total Protein: 7.4 g/dL (ref 6.4–8.2)

## 2013-03-13 LAB — TROPONIN I: Troponin-I: <0.02 ng/mL

## 2013-03-14 LAB — BASIC METABOLIC PANEL
Anion Gap: 5 — ABNORMAL LOW (ref 7–16)
BUN: 18 mg/dL (ref 7–18)
CREATININE: 1.14 mg/dL (ref 0.60–1.30)
Calcium, Total: 8.1 mg/dL — ABNORMAL LOW (ref 8.5–10.1)
Chloride: 105 mmol/L (ref 98–107)
Co2: 30 mmol/L (ref 21–32)
EGFR (African American): >60
Glucose: 83 mg/dL (ref 65–99)
Osmolality: 280 (ref 275–301)
POTASSIUM: 3.6 mmol/L (ref 3.5–5.1)
SODIUM: 140 mmol/L (ref 136–145)

## 2013-03-14 LAB — CBC WITH DIFFERENTIAL/PLATELET
BASOS ABS: 0 10*3/uL (ref 0.0–0.1)
Basophil %: 0.9 %
EOS PCT: 1.3 %
Eosinophil #: 0.1 10*3/uL (ref 0.0–0.7)
HCT: 29.6 % — ABNORMAL LOW (ref 40.0–52.0)
HGB: 10 g/dL — ABNORMAL LOW (ref 13.0–18.0)
LYMPHS PCT: 23.9 %
Lymphocyte #: 1.2 10*3/uL (ref 1.0–3.6)
MCH: 30.6 pg (ref 26.0–34.0)
MCHC: 33.7 g/dL (ref 32.0–36.0)
MCV: 91 fL (ref 80–100)
Monocyte #: 1.1 x10 3/mm — ABNORMAL HIGH (ref 0.2–1.0)
Monocyte %: 21.8 %
Neutrophil #: 2.6 10*3/uL (ref 1.4–6.5)
Neutrophil %: 52.1 %
Platelet: 260 10*3/uL (ref 150–440)
RBC: 3.26 10*6/uL — AB (ref 4.40–5.90)
RDW: 14.4 % (ref 11.5–14.5)
WBC: 4.9 10*3/uL (ref 3.8–10.6)

## 2013-03-14 LAB — URINE CULTURE

## 2013-03-18 LAB — CULTURE, BLOOD (SINGLE)

## 2013-05-08 ENCOUNTER — Ambulatory Visit: Payer: Self-pay | Admitting: Family Medicine

## 2013-10-02 LAB — CBC WITH DIFFERENTIAL/PLATELET
BASOS PCT: 0.8 %
Basophil #: 0.1 10*3/uL (ref 0.0–0.1)
EOS ABS: 0 10*3/uL (ref 0.0–0.7)
Eosinophil %: 0.3 %
HCT: 33.5 % — AB (ref 40.0–52.0)
HGB: 11.3 g/dL — ABNORMAL LOW (ref 13.0–18.0)
LYMPHS PCT: 6.1 %
Lymphocyte #: 0.6 10*3/uL — ABNORMAL LOW (ref 1.0–3.6)
MCH: 30.1 pg (ref 26.0–34.0)
MCHC: 33.6 g/dL (ref 32.0–36.0)
MCV: 90 fL (ref 80–100)
Monocyte #: 1 x10 3/mm (ref 0.2–1.0)
Monocyte %: 10.3 %
NEUTROS ABS: 8.2 10*3/uL — AB (ref 1.4–6.5)
Neutrophil %: 82.5 %
Platelet: 244 10*3/uL (ref 150–440)
RBC: 3.74 10*6/uL — ABNORMAL LOW (ref 4.40–5.90)
RDW: 14.7 % — ABNORMAL HIGH (ref 11.5–14.5)
WBC: 10 10*3/uL (ref 3.8–10.6)

## 2013-10-02 LAB — COMPREHENSIVE METABOLIC PANEL
ALK PHOS: 82 U/L
ALT: 14 U/L
ANION GAP: 9 (ref 7–16)
Albumin: 2.9 g/dL — ABNORMAL LOW (ref 3.4–5.0)
BILIRUBIN TOTAL: 0.3 mg/dL (ref 0.2–1.0)
BUN: 24 mg/dL — ABNORMAL HIGH (ref 7–18)
Calcium, Total: 8.4 mg/dL — ABNORMAL LOW (ref 8.5–10.1)
Chloride: 103 mmol/L (ref 98–107)
Co2: 28 mmol/L (ref 21–32)
Creatinine: 1.35 mg/dL — ABNORMAL HIGH (ref 0.60–1.30)
EGFR (African American): 57 — ABNORMAL LOW
GFR CALC NON AF AMER: 49 — AB
GLUCOSE: 168 mg/dL — AB (ref 65–99)
Osmolality: 287 (ref 275–301)
Potassium: 3.8 mmol/L (ref 3.5–5.1)
SGOT(AST): 20 U/L (ref 15–37)
Sodium: 140 mmol/L (ref 136–145)
Total Protein: 7.4 g/dL (ref 6.4–8.2)

## 2013-10-02 LAB — MAGNESIUM: Magnesium: 1.6 mg/dL — ABNORMAL LOW

## 2013-10-02 LAB — PROTIME-INR
INR: 1.1
PROTHROMBIN TIME: 13.8 s (ref 11.5–14.7)

## 2013-10-02 LAB — PHOSPHORUS: Phosphorus: 1.9 mg/dL — ABNORMAL LOW (ref 2.5–4.9)

## 2013-10-02 LAB — TROPONIN I: Troponin-I: 0.57 ng/mL — ABNORMAL HIGH

## 2013-10-03 ENCOUNTER — Inpatient Hospital Stay: Payer: Self-pay | Admitting: Internal Medicine

## 2013-10-03 LAB — URINALYSIS, COMPLETE
Bacteria: NONE SEEN
Bilirubin,UR: NEGATIVE
Glucose,UR: 150 mg/dL (ref 0–75)
Ketone: NEGATIVE
Nitrite: NEGATIVE
PH: 6 (ref 4.5–8.0)
PROTEIN: NEGATIVE
RBC,UR: 18 /HPF (ref 0–5)
SQUAMOUS EPITHELIAL: NONE SEEN
Specific Gravity: 1.014 (ref 1.003–1.030)

## 2013-10-03 LAB — CK-MB
CK-MB: 1.9 ng/mL (ref 0.5–3.6)
CK-MB: 2.7 ng/mL (ref 0.5–3.6)
CK-MB: 3 ng/mL (ref 0.5–3.6)

## 2013-10-03 LAB — TROPONIN I
Troponin-I: 1.2 ng/mL — ABNORMAL HIGH
Troponin-I: 0.77 ng/mL — ABNORMAL HIGH

## 2013-10-04 LAB — CBC WITH DIFFERENTIAL/PLATELET
Basophil #: 0 10*3/uL (ref 0.0–0.1)
Basophil %: 0.3 %
Eosinophil #: 0.1 10*3/uL (ref 0.0–0.7)
Eosinophil %: 1.2 %
HCT: 34.4 % — ABNORMAL LOW (ref 40.0–52.0)
HGB: 10.9 g/dL — AB (ref 13.0–18.0)
Lymphocyte #: 1.6 10*3/uL (ref 1.0–3.6)
Lymphocyte %: 13.1 %
MCH: 29.3 pg (ref 26.0–34.0)
MCHC: 31.7 g/dL — AB (ref 32.0–36.0)
MCV: 92 fL (ref 80–100)
MONOS PCT: 11.6 %
Monocyte #: 1.4 x10 3/mm — ABNORMAL HIGH (ref 0.2–1.0)
NEUTROS ABS: 8.8 10*3/uL — AB (ref 1.4–6.5)
NEUTROS PCT: 73.8 %
Platelet: 209 10*3/uL (ref 150–440)
RBC: 3.72 10*6/uL — ABNORMAL LOW (ref 4.40–5.90)
RDW: 15 % — ABNORMAL HIGH (ref 11.5–14.5)
WBC: 12 10*3/uL — ABNORMAL HIGH (ref 3.8–10.6)

## 2013-10-04 LAB — BASIC METABOLIC PANEL
Anion Gap: 8 (ref 7–16)
BUN: 17 mg/dL (ref 7–18)
CALCIUM: 8.2 mg/dL — AB (ref 8.5–10.1)
CHLORIDE: 106 mmol/L (ref 98–107)
CO2: 29 mmol/L (ref 21–32)
Creatinine: 1.28 mg/dL (ref 0.60–1.30)
EGFR (African American): >60
GFR CALC NON AF AMER: 52 — AB
Glucose: 111 mg/dL — ABNORMAL HIGH (ref 65–99)
OSMOLALITY: 287 (ref 275–301)
POTASSIUM: 3.5 mmol/L (ref 3.5–5.1)
Sodium: 143 mmol/L (ref 136–145)

## 2013-10-04 LAB — MAGNESIUM: Magnesium: 2.1 mg/dL

## 2013-10-04 LAB — VANCOMYCIN, TROUGH: Vancomycin, Trough: 15 ug/mL (ref 10–20)

## 2013-10-04 LAB — PHOSPHORUS: PHOSPHORUS: 2.8 mg/dL (ref 2.5–4.9)

## 2013-10-05 LAB — URINE CULTURE

## 2013-10-07 LAB — CULTURE, BLOOD (SINGLE)

## 2013-11-22 ENCOUNTER — Ambulatory Visit: Payer: Self-pay | Admitting: Family Medicine

## 2013-11-22 LAB — CBC WITH DIFFERENTIAL/PLATELET
BASOS ABS: 0 10*3/uL (ref 0.0–0.1)
BASOS PCT: 0.6 %
Eosinophil #: 0.2 10*3/uL (ref 0.0–0.7)
Eosinophil %: 3.9 %
HCT: 34.8 % — ABNORMAL LOW (ref 40.0–52.0)
HGB: 11.8 g/dL — ABNORMAL LOW (ref 13.0–18.0)
Lymphocyte #: 1.3 10*3/uL (ref 1.0–3.6)
Lymphocyte %: 25.5 %
MCH: 31.1 pg (ref 26.0–34.0)
MCHC: 34 g/dL (ref 32.0–36.0)
MCV: 92 fL (ref 80–100)
MONO ABS: 1 x10 3/mm (ref 0.2–1.0)
MONOS PCT: 18.2 %
NEUTROS PCT: 51.8 %
Neutrophil #: 2.7 10*3/uL (ref 1.4–6.5)
Platelet: 251 10*3/uL (ref 150–440)
RBC: 3.8 10*6/uL — ABNORMAL LOW (ref 4.40–5.90)
RDW: 15.6 % — AB (ref 11.5–14.5)
WBC: 5.2 10*3/uL (ref 3.8–10.6)

## 2014-01-07 ENCOUNTER — Ambulatory Visit: Payer: Self-pay

## 2014-01-07 LAB — URINALYSIS, COMPLETE
BILIRUBIN, UR: NEGATIVE
BLOOD: NEGATIVE
Bacteria: NONE SEEN
GLUCOSE, UR: NEGATIVE mg/dL (ref 0–75)
KETONE: NEGATIVE
NITRITE: NEGATIVE
Ph: 7 (ref 4.5–8.0)
Protein: 30
RBC,UR: 4 /HPF (ref 0–5)
SPECIFIC GRAVITY: 1.017 (ref 1.003–1.030)
WBC UR: 158 /HPF (ref 0–5)

## 2014-01-08 LAB — URINE CULTURE

## 2014-03-16 ENCOUNTER — Ambulatory Visit: Payer: Self-pay

## 2014-03-16 LAB — CBC WITH DIFFERENTIAL/PLATELET
Basophil #: 0 10*3/uL (ref 0.0–0.1)
Basophil %: 0.3 %
Eosinophil #: 0.1 10*3/uL (ref 0.0–0.7)
Eosinophil %: 0.9 %
HCT: 36.7 % — ABNORMAL LOW (ref 40.0–52.0)
HGB: 12.1 g/dL — ABNORMAL LOW (ref 13.0–18.0)
LYMPHS PCT: 12.3 %
Lymphocyte #: 1.2 10*3/uL (ref 1.0–3.6)
MCH: 30 pg (ref 26.0–34.0)
MCHC: 33 g/dL (ref 32.0–36.0)
MCV: 91 fL (ref 80–100)
MONO ABS: 1 x10 3/mm (ref 0.2–1.0)
MONOS PCT: 10.6 %
NEUTROS ABS: 7.2 10*3/uL — AB (ref 1.4–6.5)
NEUTROS PCT: 75.9 %
PLATELETS: 324 10*3/uL (ref 150–440)
RBC: 4.03 10*6/uL — AB (ref 4.40–5.90)
RDW: 14.4 % (ref 11.5–14.5)
WBC: 9.4 10*3/uL (ref 3.8–10.6)

## 2014-03-16 LAB — BASIC METABOLIC PANEL
Anion Gap: 5 — ABNORMAL LOW (ref 7–16)
BUN: 21 mg/dL — ABNORMAL HIGH (ref 7–18)
CALCIUM: 8.7 mg/dL (ref 8.5–10.1)
CHLORIDE: 104 mmol/L (ref 98–107)
CO2: 30 mmol/L (ref 21–32)
Creatinine: 1.13 mg/dL (ref 0.60–1.30)
GLUCOSE: 179 mg/dL — AB (ref 65–99)
Osmolality: 285 (ref 275–301)
POTASSIUM: 4 mmol/L (ref 3.5–5.1)
Sodium: 139 mmol/L (ref 136–145)

## 2014-03-16 LAB — HEMOGLOBIN A1C: HEMOGLOBIN A1C: 7.3 % — AB (ref 4.2–6.3)

## 2014-03-24 ENCOUNTER — Ambulatory Visit: Payer: Self-pay

## 2014-03-26 ENCOUNTER — Ambulatory Visit: Payer: Self-pay

## 2014-06-02 NOTE — Consult Note (Signed)
History of Present Illness:   Reason for Consult 81 yr old white male with history of non small cell lung ca s/p RLL lobectomy April 2003,  villous adenoma sigmoid colon s/p LAR 2003 . Consulted for possible recurrence of tumor.  Mr. Dumont is a 81 year old male with a history of non-small cell lung cancer status post right thoracotomy and right lower lobe lobectomy in 2003, chronic obstructive pulmonary disease secondary to long history of tobacco abuse, dementia with aggressive behavior, diabetes mellitus, and hypertension who was transferred to the Emergency Room after becoming combative at the assisted living facility where he has been living. He is awaiting transfer to a geropsychiatry facility via involuntary commitment and during medical clearance was found to have an abnormal chest x-ray with suggestion of new posterior rib metastasis.  We do not have records at this time of oncological treatment /pathology in 2003 but patient does have right thoracotomy scar so potentially early stage disease at diagnosis in 2003.    HPI   History obtained from chart as patient not able to give much history.81 yr old white male with history of non small cell lung ca s/p RLL lobectomy April 2003,  villous adenoma sigmoid colon s/p LAR 2003, dementia with aggressive behavior, found to have signs worrisome for metastatic lung Ca during Involuntary committment medical clearance process prior to transfer to geropsych facility. History of nonsmall cell lung CA with possible new ribs mets :  Prior treatment plan unclear as patient is not able to recall his prior diagnosis.  Head Ct, PET scan ordered.   PFSH:   Family History noncontributory    Social History positive tobacco   Review of Systems:   Performance Status (ECOG) 1     HEENT no complaints     Lungs no complaints     GI no complaints     Musculoskeletal no complaints     Psych depression  aggression    NURSING NOTES:  ED Vital Sign Flow  Sheet:   02-Nov-13 08:55    Pulse Pulse: 88    SBP SBP: 142    DBP DBP: 71    Pulse Ox % Pulse Ox %: 95    Pulse Ox Source Source: Room Air    Pain Scale (0-10) Pain Scale (0-10): Scale:0   Physical Exam:   General Patient appears comfortable,in no acute distress.Not wanting to talk.    HEENT: normal     Lungs: decreased breath sounds right     Cardiac: regular rate, rhythm     Abdomen: soft  nontender     Extremities: No edema, rash or cyanosis     Neuro: AAOx3     Psych: mood agitated     Ativan: Other  Radiology Results:  CT:    02-Nov-13 17:44, CT Chest, Abd, and Pelvis With Contrast   CT Chest, Abd, and Pelvis With Contrast    REASON FOR EXAM:    (1) presumed malignancy seen on cxr and altered   mental status; (2) presumed [place]  COMMENTS:       PROCEDURE: CT  - CT CHEST ABDOMEN AND PELVIS W  - Dec 16 2011  5:44PM     RESULT: History: Mass on chest x-ray.    Comparison Study: Prior chest x-ray of 12/15/2011. Prior chest CT of   12/28/2005.    Findings: Standard CT performed 100 cc of Isovue-300 thoracic. The   thoracic aorta unremarkable. Heart size normal. Coronary artery disease.   Pulmonary arteries  normal. Small filling defect in the right distal   mainstem bronchus most likely mucous plugging. Similar findings noted     segmental bronchi the right lower lobe. Small mediastinal nonspecific   lymph nodes are noted. Changes are present in the right upper lung most   consistent with scarring and postradiation therapy change. No change   noted from prior study of 12/28/2005. Noted however are adjacent   posterior rib destructive changes with possible fractures. The   possibility metastatic disease or posttraumatic change present this   fashion should be considered. A whole-body bone scan is suggested for   further evaluation. Adrenals are normal. Liver normal. Gallstones. No   biliary distention. A small approximately 1.7 cm soft tissue density is    noted  medial to the uncinate process posterior to the mesenteric   vasculature. This could represent lymphadenopathy or small pancreatic   tumor and MRI the abdomen can be obtained for further evaluation. Simple   cyst right kidney. Kidneys otherwise unremarkable. No obstructing stone.   Minimal ectasia of the abdominal aorta. No inflammatory changes in right   or left lower quadrant. Large amount stool noted in the colon consistent     with constipation. Umbilical hernia with herniation of small bowel noted.   There is an adjacent loop of slightly dilated small bowel. Partial small   bowel obstruction cannot be excluded. No evidence of complete   obstruction. No pneumatosis or free air.    IMPRESSION:   1. Changes in the right upper lobe consistent with scarring and or   radiation therapy and similar findings on prior study of 12/28/2005.   Recurrent tumor cannot be entirely excluded.  2. New right posterior  rib lesions, metastatic disease and posttraumatic   change cannot be excluded and whole-body bone scan suggested.  3. Umbilical hernia with herniation of small loop of small bowel. There's   adjacent slight dilatation of small bowel. Partial obstruction cannot be   excluded. No evidence of complete obstruction. No free air or   pneumatosis. Mesenteric vessels are patent. Large amount stool in colon.  4. 1.7 cm soft tissue density in the retroperitoneum just medial to the   uncinate process of the pancreas. This could represent a small pancreatic   tumor or lymph node. MRI of the abdomen suggest for further evaluation.          Verified By: Osa Craver, M.D., MD    [id number] 11:41, CT Head WWO Contrast   CT Head WWO Contrast    REASON FOR EXAM:    AMS, history of lung ca  COMMENTS:       PROCEDURE: CT  - CT HEAD W/WO  - Dec 17 2011 11:41AM     RESULT: CT of the brain without contrast and after 50 mL of Isovue-300   iodinated intravenous contrast is performed. The  noncontrast images show   changes of mild atrophy. Ill-defined low-attenuation is seen within the   periventricular and subcortical white matter in a pattern suggestive of   chronic microangiopathy changes. There is no definite mass or mass effect   on the precontrast images. Postcontrast images show no abnormal   enhancement. The sinuses and mastoid air cells show normal appearing   aeration. The calvarium appears intact.    IMPRESSION:  Atrophy with microvascular ischemic disease. No acute     intracranial abnormality. No definite metastatic disease evident. MRI is   more sensitive for small metastatic lesions and subtle changes.  Dictation Site: 6          Verified By: Sundra Aland, M.D., MD   Assessment and Plan:  Impression:   81 yr old white male with history of non small cell lung ca s/p RLL lobectomy April 2003,  villous adenoma sigmoid colon s/p LAR 2003 .2003.Chest 12/16/11 shows evidence of post thoractomy,post radiation change with no change compared to CT Chest in 2007. CT Report does comment on rib disease that could be post traumatic vs metastatic.Head shows  no evidence of brain metastatic disease.CBC,CMP normal.  Plan:   At this time no obvious evidence of recurrent disease with CT scan showing stable post radiation changes as compared to CT in 2007.Will await PET CT scan.PET CT negative will pursue psychgeriatric facility as planned.appreciated.   Electronic Signatures: Georges Mouse (MD)  (Signed [phone] 11:32)  Authored: HISTORY OF PRESENT ILLNESS, PFSH, ROS, NURSING NOTES, PE, ALLERGIES, HOME MEDICATIONS, OTHER RESULTS, ASSESSMENT AND PLAN   Last Updated: 04-Nov-13 11:32 by Georges Mouse (MD)

## 2014-06-02 NOTE — H&P (Signed)
PATIENT NAME:  Joel Summers, Joel Summers MR#:  [id number] DATE OF BIRTH:  06-Aug-1930  DATE OF ADMISSION:  12/16/2011  CHIEF COMPLAINT: Altered mental status.   HISTORY OF PRESENT ILLNESS: Joel Summers is a 81 year old male with a history of non-small cell lung cancer status post right thoracotomy and right lower lobe lobectomy in 2003, chronic obstructive pulmonary disease secondary to long history of tobacco abuse, dementia with aggressive behavior, diabetes mellitus, and hypertension who was transferred to the Emergency Room after becoming combative at the assisted living facility where he has been living. He is awaiting transfer to a geropsychiatry facility via involuntary commitment and during medical clearance was found to have an abnormal chest x-ray with suggestion of new posterior rib metastasis. The patient himself cannot recall any history of lung cancer. He does have a thoracotomy scar on his right posterior thorax. He is currently a bit agitated. He did receive several doses of Geodon and Haldol over the last 24 hours during his wait in the Emergency Room for transfer. He is being admitted to a medical bed while disposition on his medical status is arranged.   His primary care physician is unknown.   PAST MEDICAL HISTORY:  1. Dementia with aggressive behavior. 2. Diabetes mellitus, on oral medications.  3. Chronic obstructive pulmonary disease secondary to tobacco abuse. 4. Hypertension. 5. History of villous adenoma status post left anterior resection in 2003. 6. Non-small cell lung cancer status post right lower lobe lobectomy and possible XRT which may have been done at an outside facility as there are no records available.   CURRENT MEDICATIONS:  1. Metformin 500 mg twice daily.  2. Hydrochlorothiazide 25 mg daily.  3. Glyburide 5 mg daily.  4. Gabapentin 100 mg three times daily. 5. Felodipine 5 mg daily.  6. Citalopram 20 mg daily.  7. Aspirin 81 mg daily.   PAST SURGICAL HISTORY:   1. Right thoracotomy with right lower lobe lobectomy in April 2003. 2. Low anterior resection for villous adenoma of the sigmoid colon in 2003.   DRUG ALLERGIES: He has history of an adverse reaction to Ativan, reaction unknown.   LAST HOSPITALIZATION: Unknown due to the patient's dementia.   FAMILY HISTORY: Unobtainable.   SOCIAL HISTORY: He is an ex-smoker having quit 20 years ago. He was living in an assisted living facility until time of admission.   REVIEW OF SYSTEMS: Negative for fever, fatigue, weakness, pain, and weight loss. He denies any changes in vision. He denies any ear pain, hearing loss, or seasonal rhinitis. He denies cough currently. No history of recent wheezing, hemoptysis, or dyspnea. He denies painful respirations. He denies chest pain, edema, and syncope. He has no history of recent nausea, vomiting, diarrhea, or abdominal pain. He denies constipation. He says his last stool was yesterday. He has no history of polyuria, nocturia, or thyroid problems. No history of anemia, easy bruising, or bleeding. He denies any pain in the neck, back, shoulder, knee, hip, or abdomen. He denies any numbness or weakness. He denies any history of tremor, ataxia, or headaches. He has no history of strokes. He does have some anxiety and insomnia as well as a history of depression.   PHYSICAL EXAMINATION:   GENERAL: This is a well nourished appearing elderly male in no apparent distress.   VITAL SIGNS: During admission to the ER blood pressure was 149/81, pulse 96 and regular, temperature 98.1, respirations 18, and he was saturating 94% on room air. Repeat measurements today: Blood pressure 132/72, pulse  80 and regular, respirations 20, and saturating 93% on room air.   HEENT: Pupils are equal, round, and reactive to light. Extraocular movements are intact. Sclerae are anicteric. Oropharynx shows poor dentition.   NECK: Supple without lymphadenopathy, JVD, thyromegaly, or carotid bruits.    PULMONARY: Lungs are notable for diminished breath sounds in all fields. He has no evidence of wheezing or labored breathing at this time.   CARDIOVASCULAR: Regular rate and rhythm. No murmurs, rubs, or gallops.   ABDOMEN: Soft, nontender, and nondistended with good bowel sounds and no evidence of hepatosplenomegaly.   EXTREMITIES: There is no lower extremity edema and pedal pulses are palpable.   GYN: Deferred.   MUSCULOSKELETAL: He is walking and moving all extremities freely.  SKIN: Skin is warm and dry without rashes or lesions.   LYMPH: There is no cervical, axillary, or supraclavicular lymphadenopathy.   NEUROLOGICAL: Examination is grossly nonfocal. He is alert. He is oriented to place only. He is currently cooperative but is periodically agitated requiring use of medications for control.   ADMISSION LABORATORY, DIAGNOSTIC AND RADIOLOGIC DATA: Sodium 142, potassium 3.7, chloride 106, bicarbonate 27, BUN 14, creatinine 0.84, and glucose 79. Ethanol level is less than 3. Liver function tests were normal. TSH was normal at 1.93. Urine drug screen was negative. CBC: White count 8.7, hemoglobin 13.7, and platelets 243.  Urinalysis was normal.   Portable chest x-ray done on 12/15/2011 showed chronic obstructive pulmonary disease with presumed right mid lung malignancy and minimal left base atelectasis.   CT of chest, abdomen and pelvis with contrast done on 12/16/2011 shows changes in the right upper lobe consistent with scarring and/or radiation therapy and similar findings on prior study of 12/28/2005. Cannot exclude recurrent tumor. There are new posterior right rib lesions suggestive of metastatic versus posttraumatic change. There is an umbilical hernia with small loop of bowel herniating without clear evidence of partial or complete obstruction. There is no free air or pneumatosis. There is a large amount of stool in the colon. There is a 1.7 cm soft tissue density in the  retroperitoneum medial to the uncinate process of the pancreas which could represent a small pancreatic tumor or lymph node.  ASSESSMENT AND PLAN:  1. History of lung cancer with abnormal CT worrisome for metastatic cancer. The patient will need to be admitted for medical disposition prior to transfer to geropsychiatry unit. He will need a PET scan as well as a CT of the brain with contrast given his recent development of agitation.  2. History of diabetes mellitus. We will resume metformin and glyburide and check a Hemoglobin A1c in the morning.  3. History of hypertension. The patient takes felodipine which will be continued here.  4. History of depression. Continue citalopram.  5. Dementia with aggressive behavior. Continue p.r.n. Geodon and Haldol. Psychiatric consult requested for assistance in making transfer once his medical disposition has been determined.  ESTIMATED TIME OF CARE: 45 minutes.  ____________________________ Deborra Medina, MD tt:slb D: 12/16/2011 19:13:34 ET T: 12/17/2011 10:29:06 ET JOB#: [id number]  cc: Deborra Medina, MD, <Dictator> Deborra Medina MD ELECTRONICALLY SIGNED 12/28/2011 16:01

## 2014-06-02 NOTE — Discharge Summary (Signed)
PATIENT NAME:  Joel Summers, GUERRANT MR#:  [id number] DATE OF BIRTH:  Dec 27, 1930  DATE OF ADMISSION:  12/16/2011 DATE OF DISCHARGE:  12/22/2011  PRIMARY CARE PHYSICIAN: Unknown   FINAL DIAGNOSES:  1. Encephalopathy with worsening dementia and agitation.  2. History of lung cancer.  3. Dementia.  4. Hypertension.  5. Diabetes.   MEDICATIONS ON DISCHARGE:  1. Gabapentin 100 mg 3 times a day.  2. Celexa 20 mg daily.  3. Glyburide 5 mg daily.  4. Metformin 500 mg twice a day. 5. Aspirin 81 mg daily.  6. Felodipine 5 mg daily.  7. Hydrochlorothiazide 25 mg daily.  8. Donepezil 5 mg at bedtime.   DIET: Low sodium carbohydrate-controlled diet, regular consistency.   ACTIVITY: Activity as tolerated.   FOLLOW UP: Follow up 1 to 2 days doctor at facility.   REASON FOR ADMISSION: Patient was admitted 12/16/2011, discharged 12/22/2011. Chief complaint was altered mental status.    HISTORY OF PRESENT ILLNESS: 81 year old man history of non-small cell lung cancer, status post right thoracotomy, right lower lobe lobectomy 2003, chronic obstructive pulmonary disease, dementia with aggressive behavior, diabetes and hypertension. Patient is under guardianship for altered mental status. Patient's guardian is Otilio Connors at phone number [phone]. Patient was taken over by guardianship secondary to DSS for unsafe at home. He was brought over to an assisted living, started acting up and then he was brought to the Emergency Room and was involuntarily committed. He was admitted medically for abnormal chest x-ray suggestion of posterior rib metastases and he was worked up for that and consultation was obtained by oncology who recommended CT scan of the head and bone scan.   LABORATORY, DIAGNOSTIC AND RADIOLOGICAL DATA: Laboratory and radiological data during the hospital course included a urine toxicology that was negative. Urinalysis showed trace leukocyte esterase, otherwise negative. TSH 1.93. Ethanol level  negative. Glucose 79, BUN 14, creatinine 0.84, sodium 142, potassium 3.7, chloride 106, CO2 27, calcium 9.1. Liver function tests normal. White blood cell count 8.7, hemoglobin and hematocrit 13.7 and 41.6, platelet count 243. EKG showed sinus rhythm, premature supraventricular complexes. Chest x-ray showed chronic obstructive pulmonary disease with presumed right midlung malignancy, middle left lung base atelectasis. CT scan of the chest, abdomen, and pelvis with contrast showed changes right upper lobe consistent with scarring and radiation therapy. New right posterior rib lesions, metastatic disease and post traumatic change cannot be excluded. Umbilical hernia. Stool in the colon. 1.7 cm soft tissue density in the retroperitoneum could be a small pancreatic lymph node. Can consider MRI for further evaluation. CT scan of the head showed atrophy with microvascular ischemic disease, no acute intracranial abnormality. No definite metastatic disease evident and that was done with and without contrast. Total body scan bone scan showed areas of osseous remodeling consistent with patient's known history of prior rib fractures on the right, some radiotracer activity within right hemithorax, questionable area within the midportion of the left tenth rib.   HOSPITAL COURSE PER PROBLEM LIST:  1. For the patient's encephalopathy, this was secondary to worsening dementia with agitation. The patient is fixated upon going home. He said he will leave any place that he is put at and go home. The patient knows enough information and has some insight and is fixated on going home. Guardianship took over secondary to unsafe discharge, that is why he is involuntary committed and will be sent to a locked unit. I may have to medicate him prior to discharge, one dose of Geodon may  be given.  2. History of lung cancer. I think the bone scan is a negative test. The CT scan of the head with contrast was negative. If interested can follow  up with the [place] or his oncologist for routine follow up.  3. Diabetes. Continue metformin and glyburide. Check fingersticks at least once a day.  4. Hypertension. Blood pressure upon discharge is 132/77, continue current medications.  5. For his dementia, he was started on Aricept. At this point I do not think will help.   FOLLOW UP: Close clinical follow up at facility. Follow up at 1 to 2 days with doctor at facility.  TIME SPENT ON DISCHARGE: 35 minutes.  ____________________________ Tana Conch. Leslye Peer, MD rjw:cms D: 12/22/2011 08:30:18 ET T: 12/22/2011 08:48:46 ET JOB#: [id number]  cc: Tana Conch. Leslye Peer, MD, <Dictator> Marisue Brooklyn MD ELECTRONICALLY SIGNED 12/23/2011 20:20

## 2014-06-02 NOTE — Consult Note (Signed)
PATIENT NAME:  Joel Summers, Joel Summers MR#:  [id number] DATE OF BIRTH:  [birth year]  DATE OF CONSULTATION:  12/20/2011  REFERRING PHYSICIAN:  Loletha Grayer, MD  CONSULTING PHYSICIAN:  Steva Colder. Nicolasa Ducking, MD  REASON FOR CONSULTATION: Dementia and behavioral disturbances.   IDENTIFYING INFORMATION: Joel Summers is a 81 year old widowed Caucasian male who is most recently living in an assisted living facility. He has legal guardianship from [place], Otilio Connors.   HISTORY OF PRESENT ILLNESS: Joel Summers is a 81 year old widowed Caucasian male with a history of nonsmall-lung carcinoma status post thoracotomy in April 2013. CT of the head shows no evidence of brain metastatic disease but the patient does have a diagnosis of dementia. The patient was brought to the Emergency Room under an involuntary commitment taken out at the assisted living facility as the patient had been fighting with staff members and was trying to leave the facility. A Psychiatry consult was placed on November 2nd at the time of admission but, unfortunately, not completed. Today the patient does present calm but is not fully oriented to situation. He knows he's at [facility] in [place], [place] and gave the date as being October 2003. He does not know why he is in the hospital. He cannot say exactly how long he has been here. He is an extremely poor historian. The patient named the current president as Obama but could not name any prior presidents. Recall and short-term memory were poor. Insight and judgment were extremely poor and attention and concentration were poor. The patient denies any depressive symptoms or suicidal thoughts. He denies any current psychotic symptoms including auditory or visual hallucinations. No paranoid thoughts or delusions. Currently he does have p.r.n. Haldol ordered but the last dose of Haldol was given yesterday evening. The patient has been somewhat restless wandering the halls of the  medical floor and is repeatedly asking to be discharged and go home. He is not able to understand that he is in the process of being placed in a locked dementia unit at the request of DSS.   PAST PSYCHIATRIC HISTORY: Unclear at this time as the patient is a very poor historian. He denied any prior inpatient psychiatric hospitalizations but, again, the patient is a poor historian. He is on Celexa 20 mg p.o. daily supposedly for anxiety and depression although the patient denies any history of depression. No family was available for collateral information.   SUBSTANCE ABUSE HISTORY: The patient denies any history of any heavy alcohol use or illicit drug use including cocaine, cannabis, opiate, or stimulant use although, again, please note he is a poor historian. There is a history of tobacco use in the past per the [facility] and [facility].   FAMILY PSYCHIATRIC HISTORY: Unknown as the patient is a poor historian.   PAST MEDICAL HISTORY:  1. Diabetes mellitus, on oral medication. 2. COPD. 3. Hypertension. 4. History of villous adenoma status post left anterior resection in 2003. 5. Nonsmall-lung carcinoma status post right lower lobe lobectomy and possible radiation treatment. 6. Dementia. It is unclear whether or not he does have history of a TBI or seizures as the patient was a poor historian.   CURRENT INPATIENT MEDICATIONS:  1. Celexa 20 mg p.o. daily. 2. Felodipine 5 mg p.o. daily. 3. Neurontin 100 mg p.o. three times a day.  4. Hydrochlorothiazide 25 mg p.o. daily. 5. Aspirin 81 mg p.o. daily. 6. Glyburide 5 mg p.o. daily. 7. Metformin is currently suspended. 8. Haldol 2 mg every six  hours p.r.n. IV for agitation.   ALLERGIES: Ativan.   SOCIAL HISTORY: The patient was born and raised in [place] by his mother and stepfather. He had a difficult time naming his occupation. 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 The patient says he is widowed but cannot remember  when his wife passed away. He says he was living alone at home prior to admission to the hospital but records indicate that he was in an assisted living facility. He claims he has one stepdaughter and one biological daughter with his wife that live in the [place] area. Per the care manager, neither daughter is involved in his care.   LEGAL HISTORY: He denies any history of any arrests or incarcerations.   MENTAL STATUS EXAM: Joel Summers is a 81 year old Caucasian male who was wearing his hospital gown. He did have glasses. He was alert but not fully oriented to situation. He knew he was at [facility] and he gave the date as being October 2003. He knew Obama was the president but could not name any prior presidents. Speech is regular rate and rhythm, fluent and coherent. Mood was described as being "okay" but affect was anxious and the patient was repeatedly asking why he can't go home. Thought processes were significant for paucity of ideas and, again, the patient was just repeatedly asking why he can't go home. He denies any current suicidal or homicidal thoughts. He denies any current auditory or visual hallucinations. He denies any paranoid thoughts or delusions. Attention and concentration were poor. Recall was initially 3 out of 3 but only 1 out of 3 after five minutes. Judgment and insight were poor. The patient had difficulty spelling world backwards. He did, however, do serial sevens back to 86.   SUICIDE RISK ASSESSMENT: At this time the patient denies any thoughts to hurt himself or anyone else. Insight and judgment, however, remain poor. He will remain at a chronically elevated risk of harm to self and others on a moderate level secondary to dementia.   REVIEW OF SYSTEMS: CONSTITUTIONAL: He denies any weakness, fatigue, or weight changes. He denies any fever, chills, or night sweats. HEAD: He denies any headaches or dizziness. EYES: He denies any diplopia or blurred vision. ENT: He denies any hearing  loss, neck pain, or throat pain. RESPIRATORY: He denies any shortness of breath or cough. CARDIOVASCULAR: He denies any chest pain or orthopnea. GI: He denies any nausea, vomiting, or abdominal pain. He denies any change in bowel movements. GU: He denies incontinence or problems with frequency of urine. ENDOCRINE: He denies any heat or cold intolerance. LYMPHATIC: He denies any anemia or easy bruising. MUSCULOSKELETAL: He denies any muscle or joint pain. NEUROLOGIC: He denies any tingling or weakness. PSYCHIATRIC: Please see history of present illness.   PHYSICAL EXAMINATION:   VITAL SIGNS: Blood pressure 125/70, heart rate 91, respirations 18, temperature 98.6.   Please see initial physical exam as completed by admitting physician, Dr. Deborra Medina.   LABS: BMP, LFTs, and TSH within normal limits. CBC within normal limits. Urine tox screen negative for all substances. Urinalysis was nitrite and leukocyte esterase negative, 5 WBCs, no bacteria.   EKG showed a ventricular rate of 493.   CT of the head showed no metastatic disease and no acute process. CT the head did show atrophy with microvascular ischemic disease.   CT of the chest showed new right posterior rib lesions, metastatic disease, and posttraumatic changes could  not be excluded. Umbilical hernia was also present on CT.   DIAGNOSES:  AXIS I:  1. Dementia with recent behavioral disturbances. 2. Anxiety disorder, not otherwise specified.   AXIS II: Deferred.   AXIS III:  1. Nonsmall-cell lung carcinoma with possible metastatic disease.  2. History of thoracotomy. 3. COPD.  4. Hypertension.   AXIS IV: Severe. Inability to care for self independently, lack of primary support.   AXIS V: GAF at present equals 35 to 40.   ASSESSMENT AND TREATMENT RECOMMENDATIONS: Joel Summers is a 81 year old widowed Caucasian male with a diagnosis of dementia as well as multiple medical problems including nonsmall-cell lung carcinoma with metastases  who was admitted to the Medicine service secondary to altered mental status. He had been quite aggressive at the assisted living facility and is repeatedly asking to leave. Insight and judgment are extremely poor. At this time the patient is calm but insight into why he remains in the hospital and the need for placement remains poor. He denies any current suicidal thoughts or psychotic symptoms including auditory or visual hallucinations. At this time he does not meet criteria for inpatient psychiatric hospitalization as he has been calm on the unit and is easily redirectable. Due to the fact that he's under the custody of DSS and needs placement in a locked dementia unit, will place a one-to-one sitter while he is in the hospital given the fact that he has been wanting to walk around the unit. Will add p.r.n. Risperdal just in case the patient does get agitated. Will continue to follow the patient and work with care management to find proper placement for the patient in a locked dementia unit. His Education officer, museum at [employer] and legal guardian, Ms. Otilio Connors, was contacted and notified of the treatment plan and was supportive. The patient was admitted under involuntary commitment but involuntary commitment expires tomorrow, November 7th. There will most likely not be any need to renew the commitment as he is not suicidal or psychotic presenting as an imminent danger to himself or others. However, on a chronic level due to dementia he is at accidental risk of harm to self and others and should remain with a sitter until proper placement in a locked facility can be found.   ____________________________ Steva Colder. Nicolasa Ducking, MD akk:drc D: 12/20/2011 17:21:37 ET T: 12/20/2011 17:49:11 ET JOB#: [id number]  cc: Jalyssa Fleisher K. Nicolasa Ducking, MD, <Dictator> Chauncey Mann MD ELECTRONICALLY SIGNED 12/23/2011 19:07

## 2014-06-02 NOTE — Consult Note (Signed)
Brief Consult Note: Diagnosis: Previous Lung ca 2003.   Patient was seen by consultant.   Comments: CT Chest with possible rib metastases or rib trauma without any other obvious metastatic disease.CBC,CMP normal.Recommend bone scan.  If bone scan and Brain scan negative,unlikely metastatic disease and would pursue psych workup and management alone.  Electronic Signatures: Georges Mouse (MD)  (Signed [phone] 11:06)  Authored: Brief Consult Note   Last Updated: 03-Nov-13 11:06 by Georges Mouse (MD)

## 2014-06-06 NOTE — Discharge Summary (Signed)
PATIENT NAME:  Joel Summers, Joel Summers MR#:  [id number] DATE OF BIRTH:  1930-07-29  DATE OF ADMISSION:  03/13/2013 DATE OF DISCHARGE:  03/16/2013  DISCHARGE DIAGNOSES: 1.  Aspiration pneumonia. 2.  Dysphagia, 3.  PEG tube; suggest strictly to feed through PEG tube. 4.  Urinary tract infection.  5.  Emesis.  6.  Hypertension. 7.  Hyperlipidemia. 8.  Pneumonia.   CONDITION ON DISCHARGE: Stable.   MEDICATIONS ON DISCHARGE:  1. Aspirin 81 mg once a day.  2.  Haloperidol 0.5 mg oral tablet once a day.  3.  Neurontin 100 mg capsule 3 times a day via PEG tube.  4.  Clonazepam 0.125 mg oral tablet 2 times a day as needed.  5.  Namenda 5 mg oral tablet 2 times a day via tube for dementia.  6.  Metformin 500 mg oral tablet once a day via PEG tube.  7.  Metformin 500 mg oral tablet 2 times a day via PEG tube.   8.  Depakote 250 mg per pharmacy 2times a day via G-tube.  9.  Tylenol Extra Strength 500 mg oral tablet every 6 hours as needed for chest pain.  10. Norco 325 mg every four hours as needed for pain.  11. Exelon 4.6 mg 24-hour transdermal extended release once a day.  12.  Senna 0.6 mg oral tablet 2 tablets orally once a day.  13.  Dulcolax 10 mg rectal suppository as needed for constipation.  14.  Proventil inhalation puff, 2 inhalations 4 times a day.  15.  Augmentin tablet every eight hours for five days.    DIET ON DISCHARGE: Advised to feed through PEG tube strictly.   ACTIVITY LIMITATION: As tolerated.   FOLLOWUP:  Have him to follow within 1 to 2 weeks.   HISTORY OF PRESENT ILLNESS: An 81 year old male with past medical history of dementia,  COPD, diabetes, lung cancer, presented with complaint of fever, altered mental status and lethargic from nursing home. The patient was lethargic and usually he was talkative and ambulatory, but he was found with having an occasional cough and saturation in the 80s, and 2 liters nasal cannula. He also had some fever and was given a intramuscular  Rocephin at nursing home with no improvement, so he was sent to Emergency Department. He was having fever of 102.9 degrees Fahrenheit.   HOSTPIAL COURSE AND STAY:  1.  Sepsis. The patient was having sepsis on presentation. Because of health-care associated pneumonia, he was started on Zosyn and levofloxacin. We continued his medication, once he improved and advised to discharge him with PEG tube promoting   2.   Urinary tract infection. The patient was on Rocephin antibiotic and was discharged on Levaquin.  3.   Dementia.  We continued on medication.  4.  Diabetes. We had low dose medication, as there was not much feeding and continue insulin sliding scale coverage.  5.  Hypertension. Blood pressure was acceptable. We handled his medication in the hospital.  6.  After improvement in his condition, we discharged him back to nursing home, advised to feed through PEG tube only.   DATABASE: Troponin was less than 0.02. WBC was 7.2, hemoglobin was 11.9.  On admission, blood culture was negative. Chest x-ray, portable, was single view; chronic changes with interval increase associated with right pleural effusion and right lung, nodule airspace opacity, possibility of pneumonia. Urinalysis showed 106 WBCs, 2 leukocyte esterase. Creatinine 1.2 hemoglobin 10.   Total time spent in this discharge:  40  minutes.    ____________________________ Ceasar Lund Anselm Jungling, MD vgv:NTS D: 03/20/2013 21:56:26 ET T: 03/20/2013 23:40:16 ET JOB#: [id number]  cc: Ceasar Lund. Anselm Jungling, MD, <Dictator> John B. Sarina Ser, MD Rosalio Macadamia [facility] MD ELECTRONICALLY SIGNED 04/05/2013 8:33

## 2014-06-06 NOTE — H&P (Signed)
PATIENT NAME:  Joel Summers, Joel Summers MR#:  [id number] DATE OF BIRTH:  03-29-1930  DATE OF ADMISSION:  03/13/2013  REFERRING PHYSICIAN:  Dr. Harvest Dark.  PRIMARY CARE PHYSICIAN: Dr. Marisa Hua.   CHIEF COMPLAINT: Sent from nursing home for fever, altered mental status and lethargy.   HISTORY OF PRESENT ILLNESS: This is an 81 year old male with significant past medical history of dementia, COPD, hypertension, diabetes mellitus, history of lung cancer, presents with above-mentioned complaints. The patient was lethargic; not his baseline. The patient has dementia, but he is usually talkative and ambulatory, but, he was lethargic, complaining of occasional cough, shortness of breath desaturating in the 80s on 2 liters nasal cannula. The patient's baseline on oxygen, as well as had fever, the patient was given IM Rocephin in the nursing home with no improvement, so he was sent to ED. In the ED, the patient was found to be hypoxic, tachypneic at 36 breaths per minute and febrile at 102.9. The patient's chest x-ray does show right lung infiltrate. The patient has a PEG tube. The patient was started on vancomycin and levofloxacin in the ED. Hospitalist service was requested to admit the patient for sepsis due to pneumonia.   PAST MEDICAL HISTORY: 1.  Dementia with aggressive behavior.  2.  Diabetes mellitus, on oral medication.  3.  COPD.   4.  Hypertension.  5.  History of sigmoid colon villous adenoma, status post left anterior resection.  6.  Non-small lung cancer, status post right lower lobectomy.    PAST SURGICAL HISTORY: 1.  Right thoracotomy with right lower lobe lobectomy in April of 2003.  2.  Low anterior resection for villous adenoma of the sigmoid colon.   ALLERGIES: AS PER MEDICAL RECORD, HE HAS ALLERY TO ATIVAN; REACTION UNKNOWN.   FAMILY HISTORY: Unobtainable at this point, due to patient's lethargy.   SOCIAL HISTORY: The patient is an ex-smoker, quit 20 years ago. Currently  lives in skilled nursing facility.   HOME MEDICATIONS: 1.  Aspirin 81 mg oral daily.  2.  Norco as needed.  3.  Milk of magnesia as needed.  4.  Tylenol as needed.  5.  Clonazepam 0.125 mg oral disintegrating tablet 2 times a day as needed.  6.  Neurontin 100 mg oral 3 times a day.  8.  Celexa 10 mg oral daily.  9.  Metformin 500 mg oral via PEG.  10.  Haloperidol 0.5 mg oral once a day via PEG tube.  11.  Proventil as needed.  12.  Exelon patch 4.6 mg daily.  13.  Hydrochlorothiazide 25 mg oral daily.  14.  Dulcolax suppository as needed.  15.  Potassium 20 mEq oral daily.  16.  Namenda 5 mg oral 2 times a day.  17.  Acidophilus oral capsule daily.   REVIEW OF SYSTEMS:   Unable to obtain as the patient is lethargic.   PHYSICAL EXAMINATION: VITAL SIGNS: Temperature 97.9, T-max 102.9, pulse 98, respiratory rate 31, blood pressure 114/63, saturating 98% on oxygen.  GENERAL: Elderly male, chronically ill appearing in mild respiratory distress, sleeping comfortably in bed.  HEENT: Head atraumatic, normocephalic. Pupils are equal and reactive to light. Pink conjunctivae. Anicteric sclerae. Dry oral mucosa.  NECK: Supple. No thyromegaly. No JVD.  CHEST: Good air entry bilaterally with no wheezing, but has right lung rales and rhonchi.   CARDIOVASCULAR: S1, S2 heard. No rubs, murmurs or gallops.  ABDOMEN: Soft, nontender, nondistended. Bowel sounds present. Has PEG tube.  EXTREMITIES: No edema. No clubbing.  No cyanosis. Pedal pulses felt bilaterally.   PSYCHIATRIC: The patient is lethargic, arousable minimally to loud verbal stimuli.  NEUROLOGIC: Unable to evaluate secondary to his lethargy and mental status, but appears to be moving all extremities without gross significant deficits.  SKIN: Dry, dehydrated, delayed skin turgor.  MUSCULOSKELETAL: No joint effusion or erythema.   PERTINENT LABORATORIES:  Glucose 119, BUN 23, creatinine 1.2, sodium 137, potassium 4.2, chloride 103, CO2  30, ALT 27, AST 24, alk phos 86, total bilirubin 0.2. White blood cells 7.2, hemoglobin 11.9, hematocrit 35.1, platelet 339. Urinalysis showing +2 eosinophil,  106 white blood cells. Lactic acid 0.8.  IMAGING: Chest x-ray showing chronic changes with interval increase associated right pleural effusion and right lung air space opacity.   ASSESSMENT AND PLAN: 1.  Sepsis: The patient meets sepsis criteria, as he is febrile with tachypnea and tachycardia. At one point his heart rate was 101, this is most likely related to his urinary tract infection and pneumonia.  2.  Healthcare-acquired pneumonia. The patient will be continued on Zosyn and levofloxacin. Will be kept n.p.o. currently, as possibly having aspiration as well. We will continue his feed and medicine through his PEG tube.  3.   Urinary tract infection. The patient is on broad-spectrum IV antibiotics.  4.  Dementia with aggressive behavior. Continue with home medication, including Namenda  and haloperidol.  5.   Diabetes mellitus. We will hold oral agents. We will have him on insulin sliding scale.  6.  History of chronic obstructive pulmonary disease. The patient has no wheezing currently. We will continue him on as needed albuterol.  7.   Hypertension: Blood pressure is acceptable. Will hold his medication until  he is more stable.  8.   Deep vein thrombosis prophylaxis. Subcutaneous heparin.  9. CODE STATUS: Discussed with the patient's daughter. Reports he is DO NOT RESUSCITATE. The patient has the yellow sheet,  DO NOT RESUSCITATE form from the nursing home. We will continue with that. Tried to contact the patient's primary guardian on records, but no one answered. Discussed with the patient daughters.   Total time spent on admission and patient care: 50 minutes.    ____________________________ Albertine Patricia, MD dse:NTS D: 03/13/2013 04:46:23 ET T: 03/13/2013 05:01:04 ET JOB#: [id number]  cc: Albertine Patricia, MD,  <Dictator> Dabney Schanz Graciela Husbands MD ELECTRONICALLY SIGNED 03/16/2013 4:09

## 2014-06-06 NOTE — Consult Note (Signed)
PATIENT NAME:  Joel Summers, Joel Summers MR#:  [id number] DATE OF BIRTH:  08/21/1930  DATE OF CONSULTATION:  10/03/2013  REFERRING PHYSICIAN:    CONSULTING PHYSICIAN:  Isaias Cowman, MD  PRIMARY CARE PHYSICIAN:  Yehuda Mao, MD   CHIEF COMPLAINT:  Shortness of breath.   HISTORY OF PRESENT ILLNESS:  The patient is an 81 year old gentleman with history of COPD, and lung cancer, currently a resident at the nursing home who was admitted with shortness of breath. The patient was found to be short of breath, was sent to [facility] Emergency Room where he was noted to be hypoxic with a room air saturation of 86%. Chest x-ray revealed a left lung infiltrate, and the patient was febrile with a temperature of 101.5, as well as tachycardic, and was admitted to telemetry. The patient has received broad spectrum intravenous antibiotics with modest improvement. The patient has an initial elevated troponin of 0.57, in the absence of chest pain.   PAST MEDICAL HISTORY: 1.  COPD.  2.  Lung cancer.  3.  Hypertension.  4.  Dementia.   MEDICATIONS:  Aspirin 81 mg daily, Tylenol p.r.n., clonazepam 0.125 mg b.i.d. p.r.n., Neurontin 200 mg at bedtime, metformin 500 mg via PEG tube, Proventil p.r.n., Dulcolax p.r.n.  Senna 2 tablets at bedtime, Jevity 1.5 calorie bolus feeding 237 mL b.i.d., melatonin 6 mg at bedtime, Namenda 5 mg b.i.d., potassium 20 mEq via PEG tube.   SOCIAL HISTORY:  The patient currently is at skilled nursing facility; he quit tobacco use 20 years ago.   FAMILY HISTORY:  No immediate family history of coronary artery disease, or myocardial infarction.   REVIEW OF SYSTEMS:  CONSTITUTIONAL:  No fever or chills.  EYES:  No blurry vision.  EARS:  No hearing loss.  RESPIRATORY:  Shortness of breath.  CARDIOVASCULAR:  Patient denies chest pain.  GASTROINTESTINAL:  No nausea, vomiting, or diarrhea.  GENITOURINARY:  No dysuria, or hematuria.  ENDOCRINE:  No polyuria, or polydipsia.   MUSCULOSKELETAL:  No arthralgias, or myalgias.  NEUROLOGICAL:  No focal muscle weakness, or numbness.  PSYCHOLOGICAL:  No depression, or anxiety.   PHYSICAL EXAMINATION: VITAL SIGNS:  Blood pressure 110/59, pulse 87, respirations 18, temperature 101.1, pulse oximetry 94%.  HEENT:  Pupils equal and reactive to light, and accommodation.  NECK:  Supple without thyromegaly.  LUNGS:  Revealed crackles in both bases.  HEART:  Normal JVP, normal PMI, regular rate and rhythm. Normal S1, S2. No appreciable gallop, murmur, or rub.  ABDOMEN:  Soft and nontender. Pulses were intact bilaterally.  MUSCULOSKELETAL:  Normal muscle tone.  NEUROLOGIC:  The patient is alert, and oriented x 3; motor and sensory both grossly intact.   IMPRESSION:  An 81 year old gentleman with history of chronic obstructive pulmonary disease and lung cancer who presents with symptoms consistent with pneumonia with borderline elevated troponin in the absence of chest pain or ECG changes; elevated troponin likely due to demand supply ischemia.   RECOMMENDATIONS: 1.  Agree with current therapy.  2.  Would defer full dose anticoagulation.  3.  Would defer further cardiac diagnostics at this time.    ____________________________ Isaias Cowman, MD ap:nt D: 10/03/2013 16:36:26 ET T: 10/03/2013 16:46:45 ET JOB#: [id number]  cc: Isaias Cowman, MD, <Dictator> Isaias Cowman MD ELECTRONICALLY SIGNED 10/16/2013 16:00

## 2014-06-06 NOTE — H&P (Signed)
PATIENT NAME:  Joel Summers, Joel Summers MR#:  [id number] DATE OF BIRTH:  1930/08/10  DATE OF ADMISSION:  10/03/2013  REFERRING PHYSICIAN: Dr. Lurline Hare.   PRIMARY CARE PHYSICIAN: Dr. Marisa Hua.   CHIEF COMPLAINT: Sent from nursing home for shortness of breath, hypoxia.   HISTORY OF PRESENT ILLNESS: This is an 81 year old male with known history of dementia, COPD, hypertension, diabetes mellitus, history of lung cancer who presents with complaints of shortness of breath and hypoxia from the nursing home. The patient was saturating 86% on room air, as well as has complaints of cough, the patient has dementia at baseline. He is a very poor historian. The patient work-up was significant for left lung infiltrate on the chest x-ray , as he was severely hypoxic requiring oxygen. The patient as well was febrile at 101.5 upon presentation and was tachycardic at 125 as well. He was started on broad-spectrum IV antibiotics, including Levaquin, Zosyn and vancomycin. The patient has PEG tube, but as per reviewing nursing home records he appears to be on a puree diet as well. At this point there is no family member could be contacted. The patient had a few laboratory abnormalities including hypomagnesemia and hypophosphatemia as well. The patient was found to have elevated troponin at 0.57, his EKG is showing old right bundle branch block. At this point he denies any chest pain. He was given aspirin 324 mg via PEG tube.   PAST MEDICAL HISTORY:  1. Dementia with aggressive behavior. 3. Chronic obstructive pulmonary disease.  4. Hypertension.  5. History of sigmoid colon villous adenoma, status post left anterior resection.  6. Non-small lung cancer, status post right lower lobectomy.   PAST SURGICAL HISTORY:  1. Right thoracotomy with right lower lobe lobectomy in April 2003. 2. Anterior resection of villous adenoma of the sigmoid colon.   ALLERGIES: ATIVAN.   FAMILY HISTORY: Unobtainable at this point  secondary to patient's dementia.   SOCIAL HISTORY: The patient is an ex-smoker, quit 20 years ago. Lives at skilled nursing facility.   REVIEW OF SYSTEMS: Unable to be obtained secondary to patient's dementia.   HOME MEDICATIONS:  1. Aspirin 81 mg daily.  2. Tylenol as needed.  3. Clonazepam 0.125 mg oral 2 times a day as needed.  4. Depakene 10 mL 2 times a day, 10 mL is 500 mg.  5. Neurontin 200 mg at bedtime.  6. Metformin 500 mg via PEG.  7. Proventil as needed.  8. Exelon patch 9.5 mg daily.  9. Dulcolax laxative suppository as needed.  10. Senna 2 tablets at bedtime.  11. Jevity 1.5 cal bolus feed 237 mL 2 times a day.  12. Melatonin 6 mg at bedtime.  13. Namenda 5 mg 2 times a day.  14. Potassium 20 mEq via PEG daily.   PHYSICAL EXAMINATION:  VITAL SIGNS: Temperature 99.5, pulse 95, respiratory rate 24, blood pressure 108/60, saturating 97% on nasal cannula. Temperature upon presentation, T-max 101.5.  GENERAL: Frail, elderly male who looks comfortable and in no apparent distress.  HEENT: Head atraumatic, normocephalic. Pupils equal, reactive to light. Pink conjunctivae. Anicteric sclerae. Dry oral mucosa.  NECK: Supple. No thyromegaly. No JVD.  CHEST: Good air entry bilaterally. No wheezing, rales, rhonchi. No use of accessory muscles.  CARDIOVASCULAR: S1, S2. No rubs, or gallops. PMI nondisplaced.  ABDOMEN: Soft, nontender, nondistended. Bowel sounds present. Has PEG tube, site looks clean.  EXTREMITIES: No edema. No clubbing. No cyanosis. Pulses felt bilaterally.  PSYCHIATRIC: The patient is awake, alert,  communicative, pleasant but confused,  NEUROLOGIC: Cranial nerves grossly intact. Motor nonfocal.  SKIN: Delayed skin turgor, dry.  MUSCULOSKELETAL: No joint effusion or erythema.   PERTINENT LABORATORY DATA: Glucose 168, BUN 24, creatinine 1.35, sodium 140, potassium 3.8, chloride 103, CO2 28, phosphorus 1.9, magnesium 1.6, troponin 0.57. White blood cells 10,  hemoglobin 11.3, hematocrit 33.5, platelets 244,000. Urinalysis positive for leukocyte esterase and showing 52 white blood cells.   ASSESSMENT AND PLAN:  1. Sepsis. The patient is febrile, tachycardic, and meets criteria most likely related to pneumonia.  2. Acute hypoxic respiratory failure. This is due to pneumonia. Continue with oxygen as needed.  3. Pneumonia. Continue the patient on IV Zosyn and vancomycin. Followup on the blood cultures.  4. Elevated troponins. The patient denies any chest pain, has no EKG changes. This is most likely related to demand ischemia from hypoxia and sepsis. We will give him 324 mg of aspirin, cycle cardiac enzymes, follow the trend, admit him to telemetry, consult cardiology.  5. Hypomagnesemia. We will replace. We will recheck in 24 hours.  6. Hypophosphatemia. We will replace. We will recheck 24 hours.  7. History of dementia. We will continue the patient back on his Namenda and Exelon patch.  8. Diabetes mellitus. Continue with metformin.  9. Chronic obstructive pulmonary disease. Has no active wheezing. Continue with oxygen as needed and nebulizer treatment.  10. Hypertension. Blood pressure acceptable, monitor.  11. Deep vein thrombosis prophylaxis. Subcutaneous heparin.   CODE STATUS: The patient has DO NOT RESUSCITATE form from the nursing home. I tried to contact the family members on the record on our medical records and the nursing home records with no success, left son a voicemail. The patient will be continued as DO NOT RESUSCITATE as per nursing home documentation.   TOTAL TIME SPENT ON ADMISSION AND PATIENT CARE: 60 minutes.    ____________________________ Albertine Patricia, MD dse:JT D: 10/03/2013 02:11:07 ET T: 10/03/2013 02:52:39 ET JOB#: [id number]  cc: Albertine Patricia, MD, <Dictator> Nafeesa Dils Graciela Husbands MD ELECTRONICALLY SIGNED 10/03/2013 5:05

## 2014-06-06 NOTE — Discharge Summary (Signed)
PATIENT NAME:  JACKIE, LITTLEJOHN MR#:  [id number] DATE OF BIRTH:  29-Apr-1930  PRESENTING COMPLAINT: Shortness of breath and hypoxia.   DISCHARGE DIAGNOSES: 1.  Sepsis present on admission, resolved, secondary to pneumonia.  2.  Acute hypoxic respiratory failure due to pneumonia, now resolved. The patient does not need oxygen.  3.  Pneumonia. 4.  Elevated troponins, appears supply demand ischemia.  5.  Hypomagnesemia, hypophosphatemia, repleted.  6.  History of dementia. 7.  Type 2 diabetes.  8.  Chronic obstructive pulmonary disease. Continue oxygen as needed.  9.  Hypertension.  10.  Code status:  No code. DO NOT RESUSCITATE.  LABORATORY DATA: White count is 12.0, H and H is 10.9 and 34.4. Glucose is 111, BUN 17, creatinine 1.2. Magnesium 2.1, phosphorus is 2.8. Troponin is 0.77, 1.20. Blood cultures negative in 48 hours.   IMAGING: Chest x-ray consistent with severe chronic scarring in the right lung, suspect developing left lower lobe infiltrate.   CONSULTATION:  Cardiology, Isaias Cowman, MD  INSTRUCTIONS: 1.  Refer to dietary recommendations for speech and dietitian.  2.  PEG care per protocol.  3.  PT. 4.  Oxygen 1-2 liter per minute as needed.   MEDICATIONS: 1.  Clonazepam 0.125 p.o. b.i.d. p.r.n. for anxiety. 2.  Dulcolax 10 mg rectal daily p.r.n.  3.  Potassium 10 mEq p.o. daily.  4.  Levaquin 500 mg p.o. daily for 4 more days.  5.  Aspirin 81 mg daily.  6.  Tylenol 325 to 650 mg via PEG q. 4 hours p.r.n.  7.  Valproic acid 500 mg b.i.d. with meals.  8.  Neurontin 200 mg at bedtime.  9.  Metformin 500 mg via gastric tube daily.  10.  Senokot 2 tablets with PEG at bedtime.  11.  Namenda 5 mg b.i.d.  12.  Melatonin 6 mg through PEG. 13.  Exelon patch 9.5 mg topical q. 24 hours.   BRIEF SUMMARY OF HOSPITAL COURSE: Mr. Buchmann is an 81 year old Caucasian gentleman with history of dementia, hypertension, comes in from   1.  Sepsis, which was present on admission,  secondary to pneumonia. His blood cultures have remained negative.  Sepsis has resolved.  2.  Acute hypoxic respiratory failure due to left lower lobe pneumonia and chronic in the setting of chronic lung scarring. The patient was started on Zosyn and vancomycin, changed to p.o. Levaquin. His white count is stable. He is afebrile. Symptoms are stable.  3.  Elevated troponins, likely due to supply demand ischemia. Cardiology, Dr. Saralyn Pilar, saw patient.  Does not recommend any further workup, cardiac diagnostics.  4.  Hypomagnesemia, hypophosphatemia, repleted.  5.  History of dementia. The patient is back on his Namenda and Exelon patch.  6.  Type 2 diabetes, on metformin.  7.  COPD.  Patient's saturations are stable.  He will use oxygen on as needed basis.  8.  Nutrition. The patient is not the best eater. He will continue to get his Jevity at nighttime as before per dietary recommendations. Speech therapy evaluation was done for swallowing evaluation; follow recommendations per speech.   Hospital stay otherwise remained stable. The patient remained a no code, DO NOT RESUSCITATE.   TIME SPENT: Forty minutes.    ____________________________ Hart Rochester Posey Pronto, MD sap:LT D: 10/06/2013 10:05:08 ET T: 10/06/2013 16:15:18 ET JOB#: [id number]  cc: Oluwatoyin Banales A. Posey Pronto, MD, <Dictator> Isaias Cowman, MD Ilda Basset MD ELECTRONICALLY SIGNED 10/09/2013 12:31

## 2014-08-14 IMAGING — CT CT HEAD WITHOUT AND WITH CONTRAST
1 of 2 series · 13 of 30 positions shown, 17 images · non-contrast
Comparison: none

REASON FOR EXAM: AMS, history of lung ca
COMMENTS:

[Series 2: without · axial · non-contrast · 0.43mm/px · z∈[+302,+426]mm · 13 of 31 slices shown, 17 images]
[im 3/31  brain]
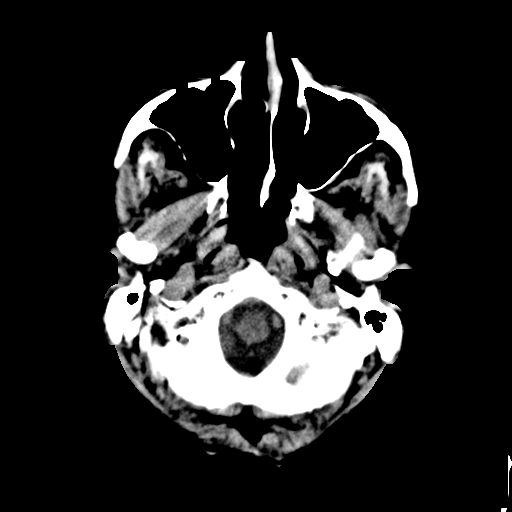
[im 3/31  bone]
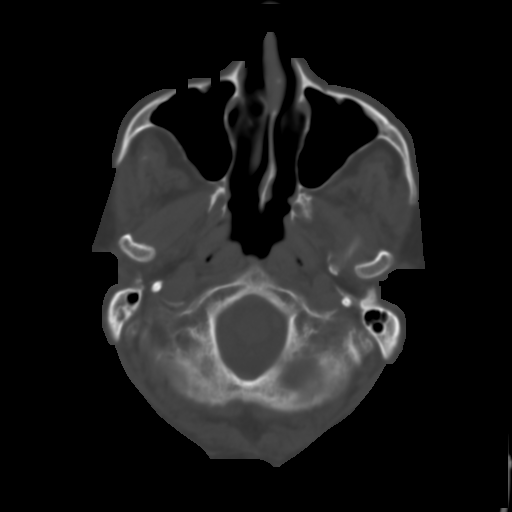
[im 5/31  brain]
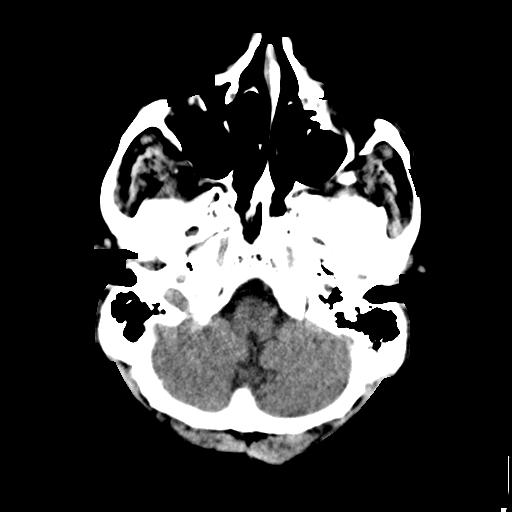
[im 7/31  brain]
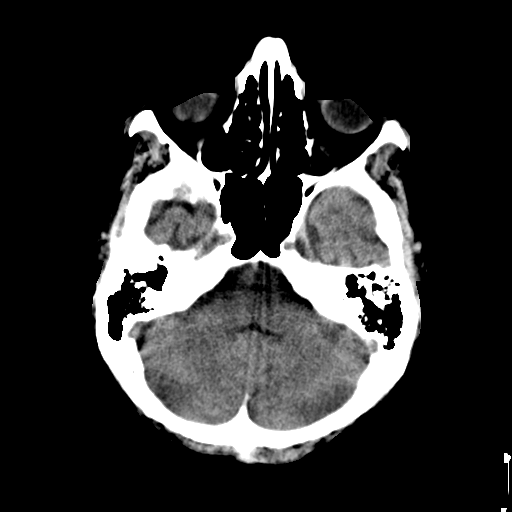
[im 9/31  brain]
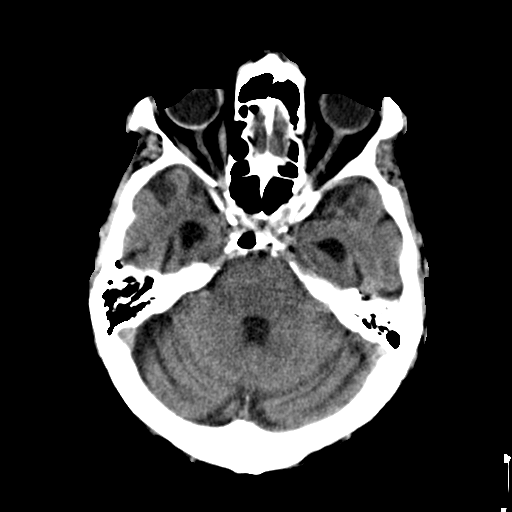
[im 11/31  brain]
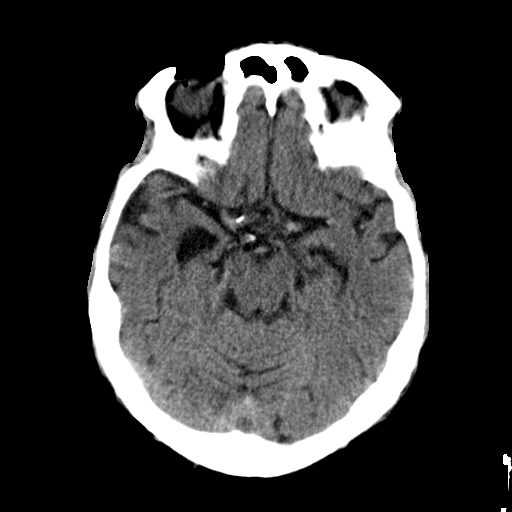
[im 11/31  bone]
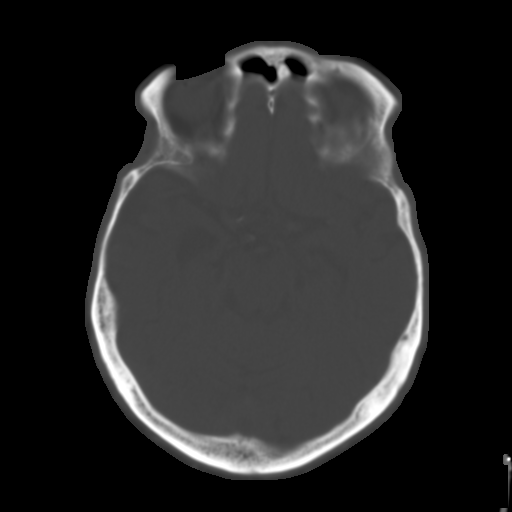
[im 13/31  brain]
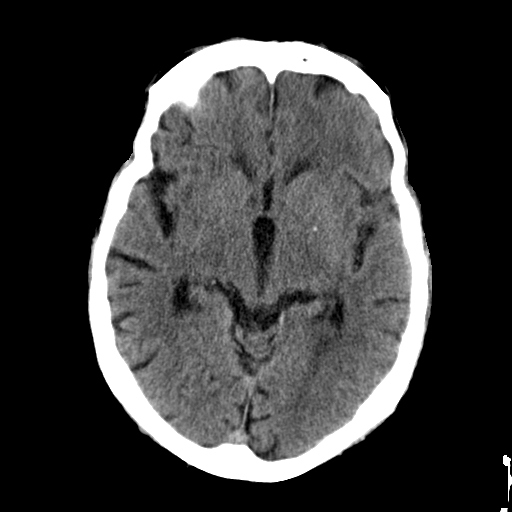
[im 16/31  brain]
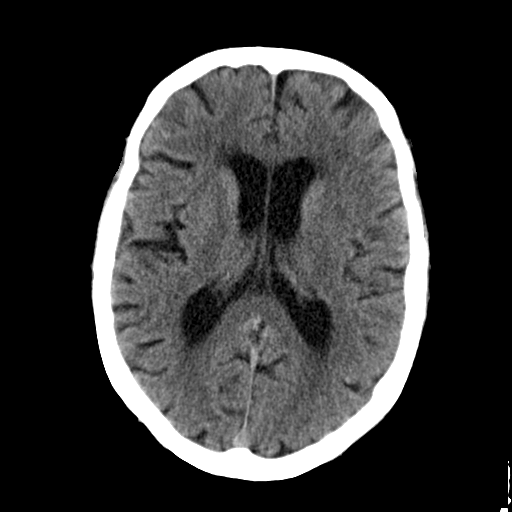
[im 18/31  brain]
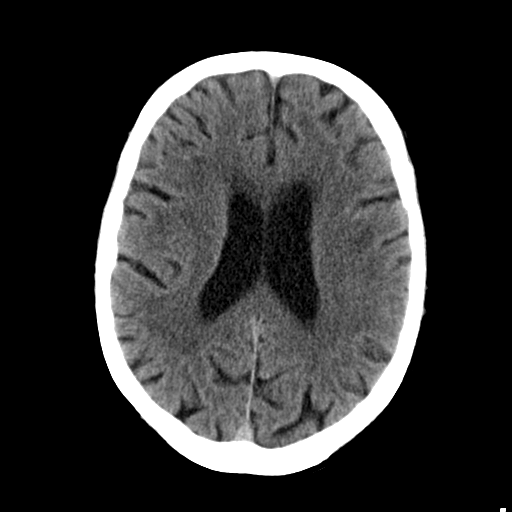
[im 20/31  brain]
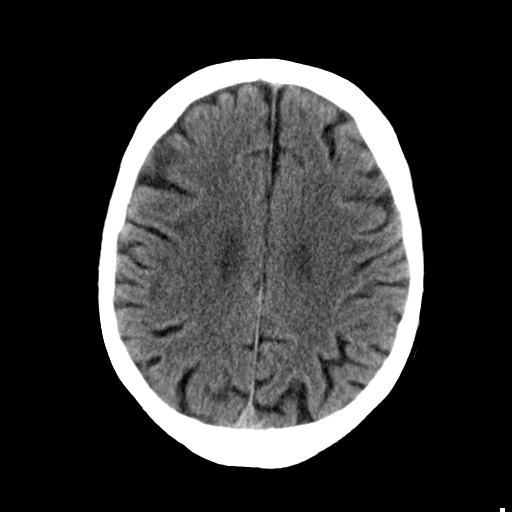
[im 20/31  bone]
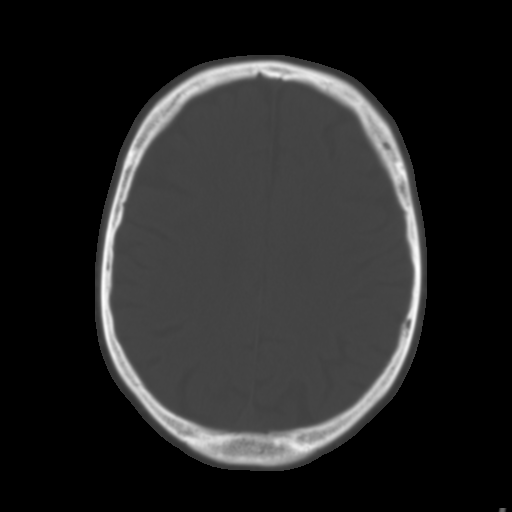
[im 22/31  brain]
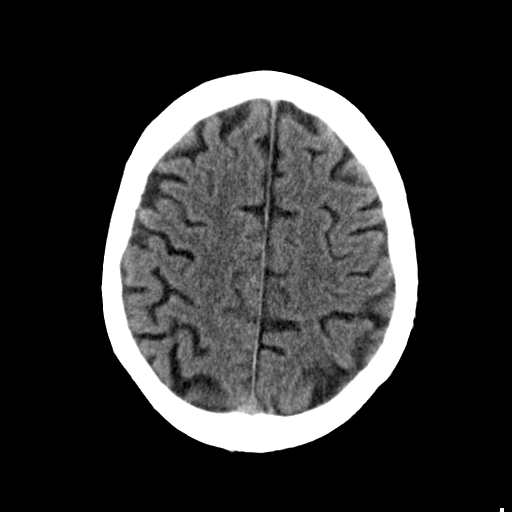
[im 24/31  brain]
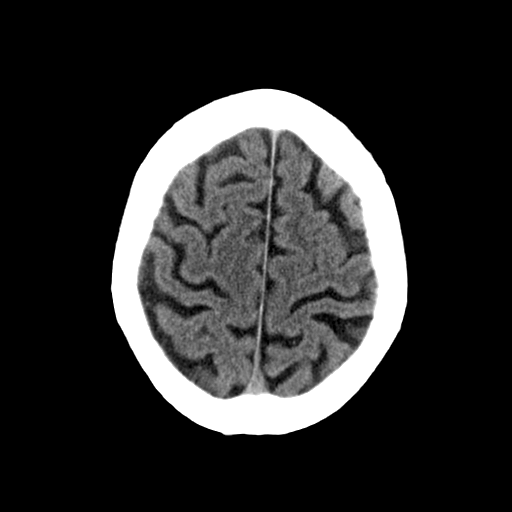
[im 26/31  brain]
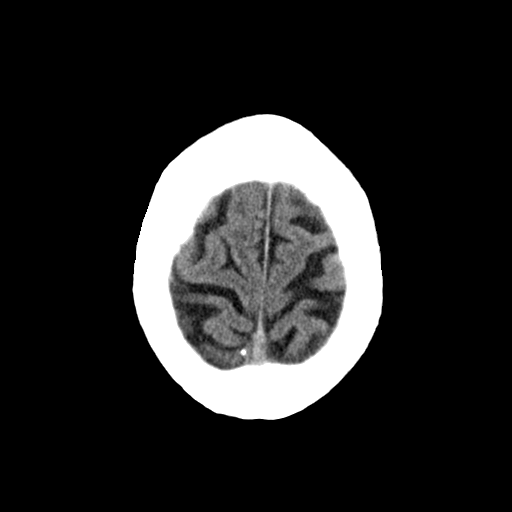
[im 28/31  brain]
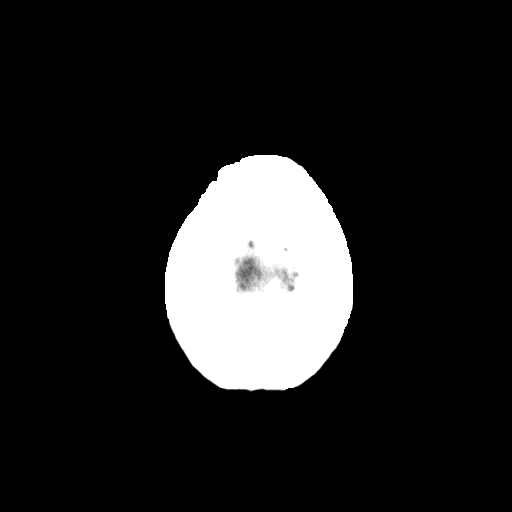
[im 28/31  bone]
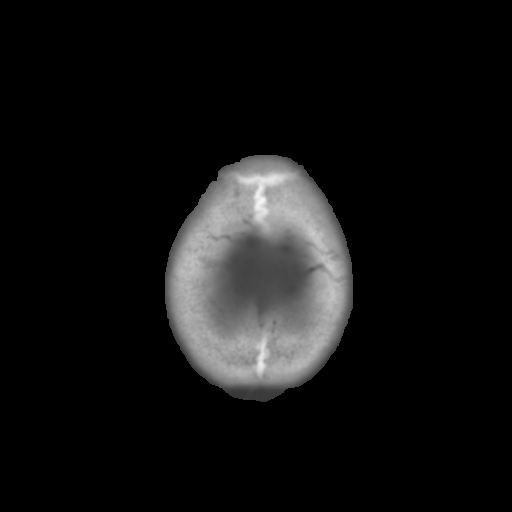

[13 of 30 positions shown; findings below may reference images not displayed]

PROCEDURE:     CT  - CT HEAD W/WO  - December 17, 2011 [DATE]

RESULT:     CT of the brain without contrast and after 50 mL of Xsovue-0LL
iodinated intravenous contrast is performed. The noncontrast images show
changes of mild atrophy. Ill-defined low-attenuation is seen within the
periventricular and subcortical white matter in a pattern suggestive of
chronic microangiopathy changes. There is no definite mass or mass effect on
the precontrast images. Postcontrast images show no abnormal enhancement.
The sinuses and mastoid air cells show normal appearing aeration. The
calvarium appears intact.
IMPRESSION: Atrophy with microvascular ischemic disease. No acute
intracranial abnormality. No definite metastatic disease evident. MRI is
more sensitive for small metastatic lesions and subtle changes.

[REDACTED]

## 2014-11-09 ENCOUNTER — Other Ambulatory Visit
Admission: RE | Admit: 2014-11-09 | Discharge: 2014-11-09 | Disposition: A | Discharge status: Home / Self Care | Payer: Medicare Other | Source: Ambulatory Visit | Attending: Nurse Practitioner | Admitting: Nurse Practitioner

## 2014-11-09 DIAGNOSIS — R4182 Altered mental status, unspecified: Secondary | ICD-10-CM | POA: Insufficient documentation

## 2014-11-09 LAB — CBC WITH DIFFERENTIAL/PLATELET
Basophils Absolute: 0.1 10*3/uL (ref 0–0.1)
Basophils Relative: 1 %
EOS ABS: 0.2 10*3/uL (ref 0–0.7)
EOS PCT: 4 %
HCT: 38.9 % — ABNORMAL LOW (ref 40.0–52.0)
Hemoglobin: 13.3 g/dL (ref 13.0–18.0)
LYMPHS ABS: 1.3 10*3/uL (ref 1.0–3.6)
Lymphocytes Relative: 23 %
MCH: 31 pg (ref 26.0–34.0)
MCHC: 34.2 g/dL (ref 32.0–36.0)
MCV: 90.7 fL (ref 80.0–100.0)
Monocytes Absolute: 0.7 10*3/uL (ref 0.2–1.0)
Monocytes Relative: 12 %
Neutro Abs: 3.6 10*3/uL (ref 1.4–6.5)
Neutrophils Relative %: 60 %
PLATELETS: 182 10*3/uL (ref 150–440)
RBC: 4.28 MIL/uL — AB (ref 4.40–5.90)
RDW: 14.6 % — ABNORMAL HIGH (ref 11.5–14.5)
WBC: 5.9 10*3/uL (ref 3.8–10.6)

## 2014-11-09 LAB — COMPREHENSIVE METABOLIC PANEL
ALT: 31 U/L (ref 17–63)
AST: 80 U/L — ABNORMAL HIGH (ref 15–41)
Albumin: 3.9 g/dL (ref 3.5–5.0)
Alkaline Phosphatase: 78 U/L (ref 38–126)
Anion gap: 6 (ref 5–15)
BUN: 26 mg/dL — ABNORMAL HIGH (ref 6–20)
CHLORIDE: 106 mmol/L (ref 101–111)
CO2: 27 mmol/L (ref 22–32)
Calcium: 9 mg/dL (ref 8.9–10.3)
Creatinine, Ser: 1.05 mg/dL (ref 0.61–1.24)
GFR calc non Af Amer: >60 mL/min (ref >60)
Glucose, Bld: 154 mg/dL — ABNORMAL HIGH (ref 65–99)
POTASSIUM: 3.9 mmol/L (ref 3.5–5.1)
SODIUM: 139 mmol/L (ref 135–145)
Total Bilirubin: 1 mg/dL (ref 0.3–1.2)
Total Protein: 6.8 g/dL (ref 6.5–8.1)

## 2015-05-23 IMAGING — CR DG CHEST 1V PORT
1 series · 1 of 1 positions shown · non-contrast
Comparison: Study obtained earlier in the day.

CLINICAL DATA: Post bronchoscopy

PORTABLE CHEST - 1 VIEW

[AP]
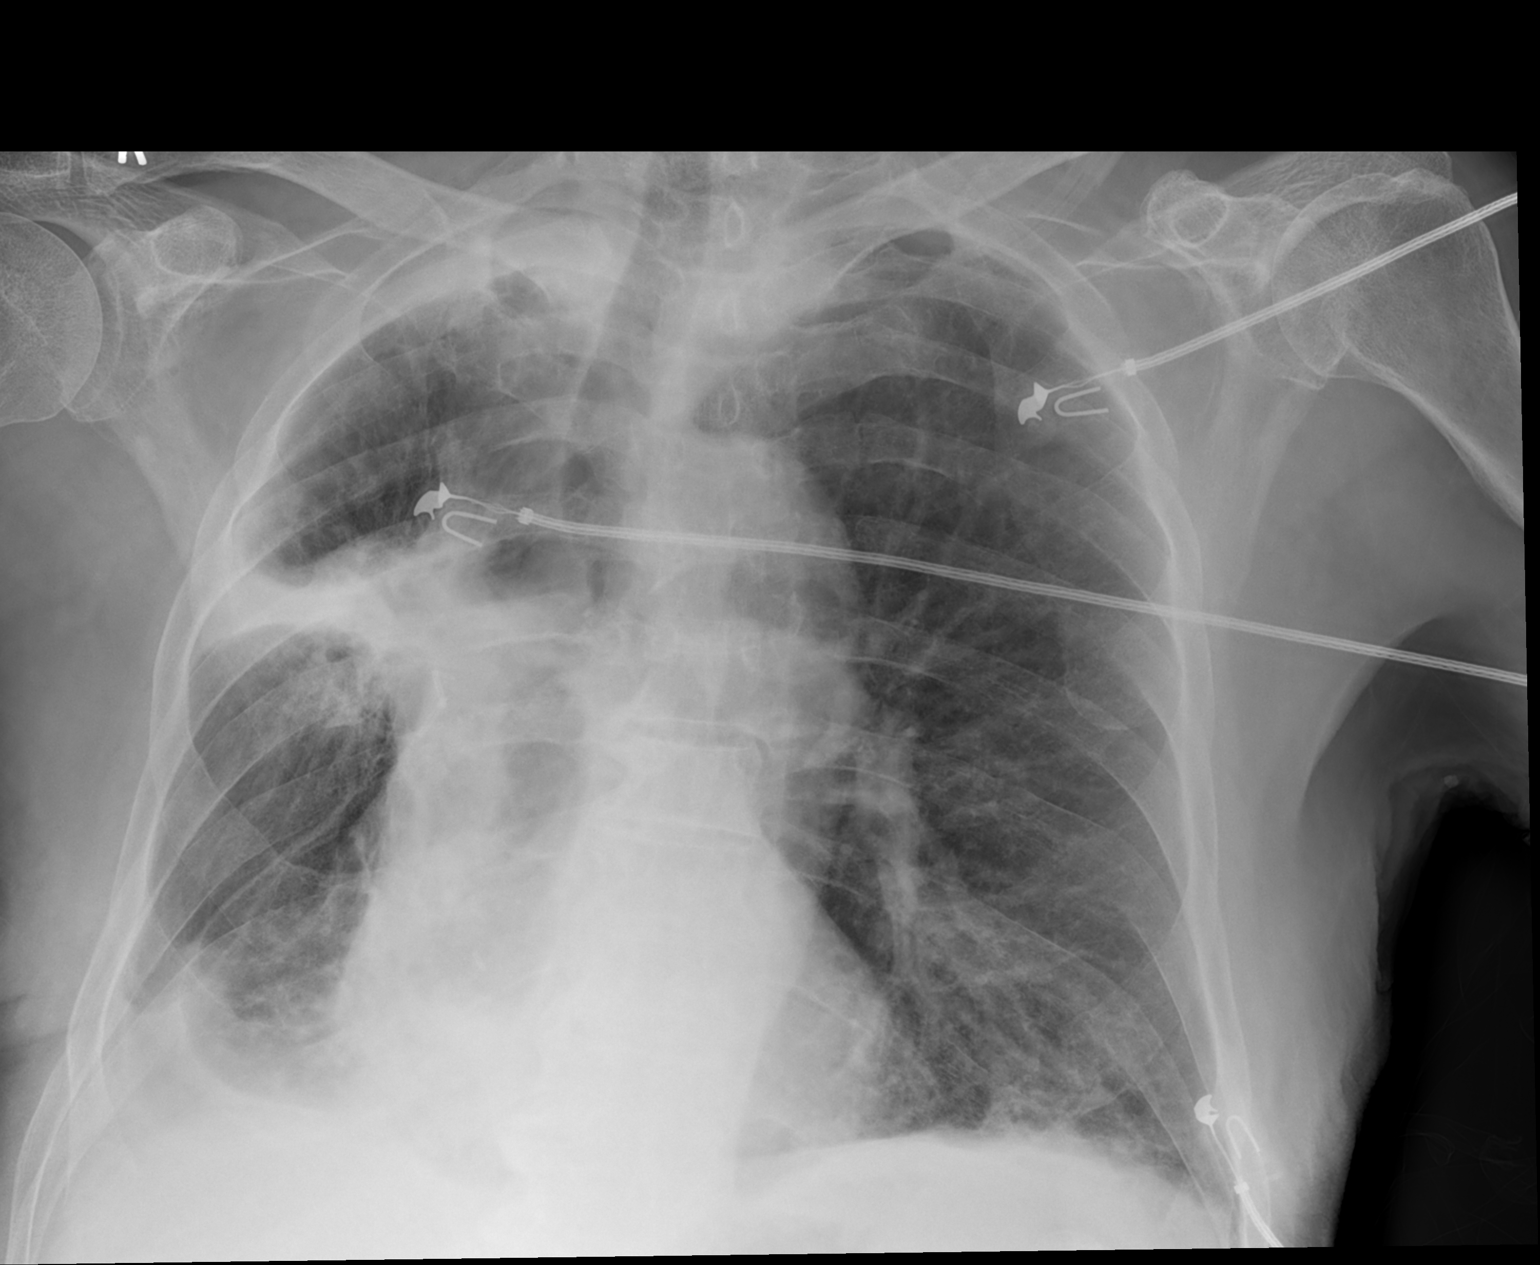

[1 of 1 positions shown; findings below may reference images not displayed]

FINDINGS: No pneumothorax.  There is scarring in the right mid
lung with retraction and volume loss.  There is apical pleural
thickening on the right.  There is scarring in the left base as
well.  There is no new opacity.  Heart size and pulmonary
vascularity are within normal limits given the scarring and
retraction on the right.

There is underlying emphysema.
IMPRESSION: No appreciable change compared to study obtained earlier in the
day.  No pneumothorax.

## 2016-02-09 ENCOUNTER — Other Ambulatory Visit: Payer: Self-pay | Admitting: Cardiology

## 2016-02-10 ENCOUNTER — Other Ambulatory Visit: Payer: Self-pay | Admitting: Family Medicine

## 2016-02-10 DIAGNOSIS — K9423 Gastrostomy malfunction: Secondary | ICD-10-CM

## 2016-02-11 ENCOUNTER — Ambulatory Visit
Admission: RE | Admit: 2016-02-11 | Discharge: 2016-02-11 | Disposition: A | Discharge status: Home / Self Care | Payer: Medicare Other | Source: Ambulatory Visit | Attending: Interventional Radiology | Admitting: Interventional Radiology

## 2016-02-11 ENCOUNTER — Other Ambulatory Visit (HOSPITAL_COMMUNITY): Payer: Self-pay | Admitting: Interventional Radiology

## 2016-02-11 ENCOUNTER — Ambulatory Visit: Admission: RE | Admit: 2016-02-11 | Payer: Medicare Other | Source: Ambulatory Visit

## 2016-02-11 DIAGNOSIS — K9423 Gastrostomy malfunction: Secondary | ICD-10-CM

## 2016-02-11 MED ORDER — IOPAMIDOL (ISOVUE-370) INJECTION 76%
60.0000 mL | Freq: Once | INTRAVENOUS | Status: AC | PRN
Start: 2016-02-11 — End: 2016-02-11
  Administered 2016-02-11: 60 mL

## 2016-02-11 MED ORDER — LIDOCAINE VISCOUS 2 % MT SOLN
OROMUCOSAL | Status: AC
  Filled 2016-02-11: qty 15

## 2016-05-20 ENCOUNTER — Encounter: Payer: Self-pay | Admitting: Emergency Medicine

## 2016-05-20 ENCOUNTER — Observation Stay
Admission: EM | Admit: 2016-05-20 | Discharge: 2016-05-22 | Disposition: A | Discharge status: Skilled Nursing Facility | Payer: Medicare Other | Attending: Internal Medicine | Admitting: Internal Medicine

## 2016-05-20 ENCOUNTER — Emergency Department: Payer: Medicare Other

## 2016-05-20 DIAGNOSIS — E876 Hypokalemia: Secondary | ICD-10-CM | POA: Insufficient documentation

## 2016-05-20 DIAGNOSIS — F0281 Dementia in other diseases classified elsewhere with behavioral disturbance: Secondary | ICD-10-CM | POA: Insufficient documentation

## 2016-05-20 DIAGNOSIS — Z85118 Personal history of other malignant neoplasm of bronchus and lung: Secondary | ICD-10-CM | POA: Diagnosis not present

## 2016-05-20 DIAGNOSIS — I4892 Unspecified atrial flutter: Secondary | ICD-10-CM | POA: Diagnosis not present

## 2016-05-20 DIAGNOSIS — J449 Chronic obstructive pulmonary disease, unspecified: Secondary | ICD-10-CM | POA: Diagnosis present

## 2016-05-20 DIAGNOSIS — Z79899 Other long term (current) drug therapy: Secondary | ICD-10-CM | POA: Insufficient documentation

## 2016-05-20 DIAGNOSIS — Z7984 Long term (current) use of oral hypoglycemic drugs: Secondary | ICD-10-CM | POA: Insufficient documentation

## 2016-05-20 DIAGNOSIS — G20 Parkinson's disease: Secondary | ICD-10-CM | POA: Diagnosis not present

## 2016-05-20 DIAGNOSIS — E43 Unspecified severe protein-calorie malnutrition: Secondary | ICD-10-CM | POA: Diagnosis not present

## 2016-05-20 DIAGNOSIS — R079 Chest pain, unspecified: Principal | ICD-10-CM | POA: Diagnosis present

## 2016-05-20 DIAGNOSIS — Z7982 Long term (current) use of aspirin: Secondary | ICD-10-CM | POA: Insufficient documentation

## 2016-05-20 DIAGNOSIS — I10 Essential (primary) hypertension: Secondary | ICD-10-CM | POA: Diagnosis not present

## 2016-05-20 DIAGNOSIS — Z87891 Personal history of nicotine dependence: Secondary | ICD-10-CM | POA: Diagnosis not present

## 2016-05-20 DIAGNOSIS — E119 Type 2 diabetes mellitus without complications: Secondary | ICD-10-CM

## 2016-05-20 DIAGNOSIS — R1310 Dysphagia, unspecified: Secondary | ICD-10-CM | POA: Insufficient documentation

## 2016-05-20 DIAGNOSIS — G20A1 Parkinson's disease without dyskinesia, without mention of fluctuations: Secondary | ICD-10-CM

## 2016-05-20 DIAGNOSIS — E1140 Type 2 diabetes mellitus with diabetic neuropathy, unspecified: Secondary | ICD-10-CM | POA: Insufficient documentation

## 2016-05-20 DIAGNOSIS — F0280 Dementia in other diseases classified elsewhere without behavioral disturbance: Secondary | ICD-10-CM

## 2016-05-20 HISTORY — DX: Unspecified atrial flutter: I48.92

## 2016-05-20 HISTORY — DX: Dysphagia, unspecified: R13.10

## 2016-05-20 HISTORY — DX: Unspecified dementia, unspecified severity, without behavioral disturbance, psychotic disturbance, mood disturbance, and anxiety: F03.90

## 2016-05-20 LAB — HEPATIC FUNCTION PANEL
ALBUMIN: 3.9 g/dL (ref 3.5–5.0)
ALT: 19 U/L (ref 17–63)
AST: 30 U/L (ref 15–41)
Alkaline Phosphatase: 79 U/L (ref 38–126)
BILIRUBIN TOTAL: 0.7 mg/dL (ref 0.3–1.2)
Bilirubin, Direct: 0.1 mg/dL (ref 0.1–0.5)
Indirect Bilirubin: 0.6 mg/dL (ref 0.3–0.9)
Total Protein: 7.2 g/dL (ref 6.5–8.1)

## 2016-05-20 LAB — CKMB (ARMC ONLY): CK, MB: 3.1 ng/mL (ref 0.5–5.0)

## 2016-05-20 LAB — BASIC METABOLIC PANEL
Anion gap: 6 (ref 5–15)
BUN: 23 mg/dL — AB (ref 6–20)
CALCIUM: 8.7 mg/dL — AB (ref 8.9–10.3)
CO2: 31 mmol/L (ref 22–32)
CREATININE: 0.89 mg/dL (ref 0.61–1.24)
Chloride: 98 mmol/L — ABNORMAL LOW (ref 101–111)
GFR calc Af Amer: >60 mL/min (ref >60)
GLUCOSE: 119 mg/dL — AB (ref 65–99)
POTASSIUM: 4 mmol/L (ref 3.5–5.1)
SODIUM: 135 mmol/L (ref 135–145)

## 2016-05-20 LAB — URINALYSIS, COMPLETE (UACMP) WITH MICROSCOPIC
BACTERIA UA: NONE SEEN
Bilirubin Urine: NEGATIVE
Glucose, UA: NEGATIVE mg/dL
HGB URINE DIPSTICK: NEGATIVE
Ketones, ur: NEGATIVE mg/dL
Leukocytes, UA: NEGATIVE
Nitrite: NEGATIVE
PH: 7 (ref 5.0–8.0)
Protein, ur: NEGATIVE mg/dL
SPECIFIC GRAVITY, URINE: 1.011 (ref 1.005–1.030)

## 2016-05-20 LAB — CBC
HEMATOCRIT: 39.4 % — AB (ref 40.0–52.0)
Hemoglobin: 13.3 g/dL (ref 13.0–18.0)
MCH: 31.2 pg (ref 26.0–34.0)
MCHC: 33.8 g/dL (ref 32.0–36.0)
MCV: 92.3 fL (ref 80.0–100.0)
PLATELETS: 162 10*3/uL (ref 150–440)
RBC: 4.27 MIL/uL — ABNORMAL LOW (ref 4.40–5.90)
RDW: 14.7 % — AB (ref 11.5–14.5)
WBC: 7.4 10*3/uL (ref 3.8–10.6)

## 2016-05-20 LAB — TROPONIN I
TROPONIN I: 0.03 ng/mL — AB (ref <0.03)
Troponin I: <0.03 ng/mL (ref <0.03)

## 2016-05-20 LAB — LIPASE, BLOOD: Lipase: <10 U/L — ABNORMAL LOW (ref 11–51)

## 2016-05-20 MED ORDER — NITROGLYCERIN 2 % TD OINT
0.5000 [in_us] | TOPICAL_OINTMENT | TRANSDERMAL | Status: AC
  Administered 2016-05-20: 0.5 [in_us] via TOPICAL
  Filled 2016-05-20: qty 1

## 2016-05-20 MED ORDER — FREE WATER
200.0000 mL | Freq: Three times a day (TID) | Status: DC
  Administered 2016-05-20 – 2016-05-22 (×5): 200 mL

## 2016-05-20 MED ORDER — MAGNESIUM HYDROXIDE 400 MG/5ML PO SUSP: 30.0000 mL | Freq: Every day | ORAL | Status: DC | PRN

## 2016-05-20 MED ORDER — FELODIPINE ER 5 MG PO TB24
5.0000 mg | ORAL_TABLET | Freq: Every day | ORAL | Status: DC
  Administered 2016-05-21 – 2016-05-22 (×2): 5 mg via ORAL
  Filled 2016-05-20 (×2): qty 1

## 2016-05-20 MED ORDER — ALBUTEROL SULFATE (2.5 MG/3ML) 0.083% IN NEBU: 2.5000 mg | INHALATION_SOLUTION | RESPIRATORY_TRACT | Status: DC | PRN

## 2016-05-20 MED ORDER — SODIUM CHLORIDE 0.9% FLUSH
3.0000 mL | Freq: Two times a day (BID) | INTRAVENOUS | Status: DC
  Administered 2016-05-20 – 2016-05-22 (×4): 3 mL via INTRAVENOUS

## 2016-05-20 MED ORDER — RISAQUAD PO CAPS
1.0000 | ORAL_CAPSULE | Freq: Every day | ORAL | Status: DC
  Administered 2016-05-21 – 2016-05-22 (×2): 1 via ORAL
  Filled 2016-05-20 (×2): qty 1

## 2016-05-20 MED ORDER — ALBUTEROL SULFATE 2 MG PO TABS: 2.0000 mg | ORAL_TABLET | Freq: Three times a day (TID) | ORAL | Status: DC

## 2016-05-20 MED ORDER — HYDROCODONE-ACETAMINOPHEN 5-325 MG PO TABS: 1.0000 | ORAL_TABLET | ORAL | Status: DC | PRN

## 2016-05-20 MED ORDER — MEMANTINE HCL ER 14 MG PO CP24
14.0000 mg | ORAL_CAPSULE | Freq: Every day | ORAL | Status: DC
  Administered 2016-05-21 – 2016-05-22 (×2): 14 mg via ORAL
  Filled 2016-05-20 (×3): qty 1

## 2016-05-20 MED ORDER — HALOPERIDOL 0.5 MG PO TABS
0.5000 mg | ORAL_TABLET | Freq: Two times a day (BID) | ORAL | Status: DC
  Filled 2016-05-20: qty 1

## 2016-05-20 MED ORDER — CITALOPRAM HYDROBROMIDE 20 MG PO TABS: 20.0000 mg | ORAL_TABLET | Freq: Every day | ORAL | Status: DC

## 2016-05-20 MED ORDER — ACETAMINOPHEN 650 MG RE SUPP: 650.0000 mg | Freq: Four times a day (QID) | RECTAL | Status: DC | PRN

## 2016-05-20 MED ORDER — BISACODYL 10 MG RE SUPP
10.0000 mg | Freq: Every day | RECTAL | Status: DC
  Filled 2016-05-20 (×2): qty 1

## 2016-05-20 MED ORDER — GABAPENTIN 100 MG PO CAPS
100.0000 mg | ORAL_CAPSULE | Freq: Three times a day (TID) | ORAL | Status: DC
  Administered 2016-05-20 – 2016-05-22 (×5): 100 mg via ORAL
  Filled 2016-05-20 (×5): qty 1

## 2016-05-20 MED ORDER — DIVALPROEX SODIUM ER 500 MG PO TB24
500.0000 mg | ORAL_TABLET | Freq: Two times a day (BID) | ORAL | Status: DC
  Administered 2016-05-20 – 2016-05-22 (×4): 500 mg via ORAL
  Filled 2016-05-20 (×5): qty 1

## 2016-05-20 MED ORDER — ACETAMINOPHEN 325 MG PO TABS: 650.0000 mg | ORAL_TABLET | Freq: Four times a day (QID) | ORAL | Status: DC | PRN

## 2016-05-20 MED ORDER — METFORMIN HCL 500 MG PO TABS: 500.0000 mg | ORAL_TABLET | Freq: Every day | ORAL | Status: DC

## 2016-05-20 MED ORDER — DONEPEZIL HCL 5 MG PO TABS
10.0000 mg | ORAL_TABLET | Freq: Every day | ORAL | Status: DC
  Administered 2016-05-20: 10 mg via ORAL
  Filled 2016-05-20: qty 2

## 2016-05-20 MED ORDER — ASPIRIN 81 MG PO CHEW: 81.0000 mg | CHEWABLE_TABLET | Freq: Every day | ORAL | Status: DC

## 2016-05-20 MED ORDER — GUAIFENESIN 100 MG/5ML PO SOLN: 200.0000 mg | Freq: Three times a day (TID) | ORAL | Status: DC | PRN

## 2016-05-20 MED ORDER — ENOXAPARIN SODIUM 30 MG/0.3ML ~~LOC~~ SOLN: 30.0000 mg | SUBCUTANEOUS | Status: DC

## 2016-05-20 MED ORDER — JEVITY 1.2 CAL PO LIQD: 1000.0000 mL | ORAL | Status: DC

## 2016-05-20 MED ORDER — QUETIAPINE FUMARATE 25 MG PO TABS
25.0000 mg | ORAL_TABLET | Freq: Every day | ORAL | Status: DC
  Administered 2016-05-20 – 2016-05-21 (×2): 25 mg via JEJUNOSTOMY
  Filled 2016-05-20 (×2): qty 1

## 2016-05-20 MED ORDER — ENOXAPARIN SODIUM 40 MG/0.4ML ~~LOC~~ SOLN
40.0000 mg | SUBCUTANEOUS | Status: DC
  Administered 2016-05-21: 40 mg via SUBCUTANEOUS
  Filled 2016-05-20: qty 0.4

## 2016-05-20 MED ORDER — HYDROCHLOROTHIAZIDE 25 MG PO TABS: 25.0000 mg | ORAL_TABLET | Freq: Every day | ORAL | Status: DC

## 2016-05-20 MED ORDER — HALOPERIDOL LACTATE 5 MG/ML IJ SOLN
2.0000 mg | Freq: Once | INTRAMUSCULAR | Status: AC
  Administered 2016-05-20: 2 mg via INTRAVENOUS
  Filled 2016-05-20: qty 1

## 2016-05-20 NOTE — H&P (Signed)
PCP:   Sande Brothers, MD   Chief Complaint:  Chest pain  HPI: This 83 year old male with severe dementia who was sent to the ER from the nursing home after complaining of chest pain. His chest pain was significant enough that he received 3 nitroglycerin prior to being sent to the ER. The patient's unable to provide any history, he does not even recall having had chest pains. In the ER his EKG showed a atrial flutter, new diagnosis. The patient is rate controlled.  Review of Systems:  Unable to obtain secondary dementia  Past Medical History: Past Medical History:  Diagnosis Date  . Altered mental status   . Cancer ([place])   . COPD (chronic obstructive pulmonary disease) ([place])   . Dementia   . Diabetes mellitus without complication ([place])   . Dysphagia   . Hypertension   . Lung cancer [facility])    Past Surgical History:  Procedure Laterality Date  . VIDEO BRONCHOSCOPY Bilateral 09/24/2012   Procedure: VIDEO BRONCHOSCOPY WITHOUT FLUORO;  Surgeon: [name], MD;  Location: [facility];  Service: Cardiopulmonary;  Laterality: Bilateral;    Medications: Prior to Admission medications   Medication Sig Start Date End Date Taking? Authorizing Provider  acetaminophen (TYLENOL) 500 MG tablet Take 500 mg by mouth every 6 (six) hours as needed for pain.   Yes Historical Provider, MD  acidophilus (RISAQUAD) CAPS capsule Take 1 capsule by mouth daily. For 20 days    Historical Provider, MD  albuterol (PROVENTIL HFA;VENTOLIN HFA) 108 (90 BASE) MCG/ACT inhaler Inhale 1-2 puffs into the lungs every 4 (four) hours as needed for wheezing or shortness of breath. 09/20/12   Linton Flemings, MD  albuterol (PROVENTIL) 2 MG tablet Take 2 mg by mouth 3 (three) times daily. For 5 days    Historical Provider, MD  amoxicillin-clavulanate (AUGMENTIN) 500-125 MG per tablet Take 1 tablet (500 mg total) by mouth 3 (three) times daily. For 3 more days. 09/27/12   Venetia Maxon Rama, MD  aspirin 81 MG chewable tablet Chew 81  mg by mouth daily.    Historical Provider, MD  bisacodyl (DULCOLAX) 10 MG suppository Place 1 suppository (10 mg total) rectally daily. 09/27/12   Venetia Maxon Rama, MD  citalopram (CELEXA) 20 MG tablet Take 20 mg by mouth daily.    Historical Provider, MD  divalproex (DEPAKOTE ER) 500 MG 24 hr tablet Take 500 mg by mouth 2 (two) times daily.    Historical Provider, MD  donepezil (ARICEPT) 10 MG tablet Take 10 mg by mouth at bedtime.    Historical Provider, MD  felodipine (PLENDIL) 5 MG 24 hr tablet Take 5 mg by mouth daily.    Historical Provider, MD  gabapentin (NEURONTIN) 100 MG capsule Take 100 mg by mouth 3 (three) times daily.    Historical Provider, MD  guaiFENesin (ROBITUSSIN) 100 MG/5ML liquid Take 200 mg by mouth 3 (three) times daily as needed for cough.    Historical Provider, MD  haloperidol (HALDOL) 0.5 MG tablet Take 1 tablet (0.5 mg total) by mouth 2 (two) times daily. 09/27/12   Venetia Maxon Rama, MD  hydrochlorothiazide (HYDRODIURIL) 25 MG tablet Take 25 mg by mouth daily.    Historical Provider, MD  HYDROcodone-acetaminophen (NORCO/VICODIN) 5-325 MG per tablet Take 1-2 tablets by mouth every 4 (four) hours as needed. 09/27/12   Venetia Maxon Rama, MD  magnesium hydroxide (MILK OF MAGNESIA) 400 MG/5ML suspension Take 30 mLs by mouth daily as needed for constipation.    Historical Provider,  MD  Memantine HCl ER (NAMENDA XR) 14 MG CP24 Take by mouth.    Historical Provider, MD  metFORMIN (GLUCOPHAGE) 500 MG tablet Take 500 mg by mouth daily with breakfast.    Historical Provider, MD  Nutritional Supplements (FEEDING SUPPLEMENT, JEVITY 1.2 CAL,) LIQD Place 1,000 mL into feeding tube continuous. 09/27/12   Venetia Maxon Rama, MD  potassium chloride 20 MEQ/15ML (10%) solution Take 15 mL (20 mEq total) by mouth 2 (two) times daily. 09/27/12   Venetia Maxon Rama, MD  Water For Irrigation, Sterile (FREE WATER) SOLN Place 200 mL into feeding tube every 8 (eight) hours. 09/27/12   Venetia Maxon Rama, MD     Allergies:   Allergies  Allergen Reactions  . Ativan [Lorazepam]     Social History:  reports that he quit smoking about 37 years ago. He has never used smokeless tobacco. He reports that he does not drink alcohol or use drugs.  Family History: Unable to obtain d/t dementia  Physical Exam: Vitals:   05/20/16 1835 05/20/16 1930 05/20/16 1941 05/20/16 1948  BP: (!) 148/70 128/80    Pulse:  (!) 102 88   Resp: (!) 22  17   Temp: 99.8 F (37.7 C)   99.4 F (37.4 C)  TempSrc: Oral   Oral  SpO2: 94% 100% 95%   Weight:        General:  Alert, demented patient Eyes: PERRLA, pink conjunctiva, no scleral icterus ENT: Moist oral mucosa, neck supple, no thyromegaly Lungs: clear to ascultation, no wheeze, no crackles, no use of accessory muscles Cardiovascular: regular rate and rhythm, no regurgitation, no gallops, no murmurs. No carotid bruits, no JVD Abdomen: soft, positive BS, non-tender, non-distended, no organomegaly, not an acute abdomen GU: not examined Neuro: CN II - XII grossly intact, sensation intact Musculoskeletal: strength 5/5 all extremities, no clubbing, cyanosis or edema Skin: no rash, no subcutaneous crepitation, no decubitus Psych: demented patient   Labs on Admission:   Recent Labs  05/20/16 1837  NA 135  K 4.0  CL 98*  CO2 31  GLUCOSE 119*  BUN 23*  CREATININE 0.89  CALCIUM 8.7*    Recent Labs  05/20/16 1837  AST 30  ALT 19  ALKPHOS 79  BILITOT 0.7  PROT 7.2  ALBUMIN 3.9    Recent Labs  05/20/16 1837  LIPASE <10*    Recent Labs  05/20/16 1837  WBC 7.4  HGB 13.3  HCT 39.4*  MCV 92.3  PLT 162    Recent Labs  05/20/16 1837  TROPONINI <0.03   Invalid input(s): POCBNP No results for input(s): DDIMER in the last 72 hours. No results for input(s): HGBA1C in the last 72 hours. No results for input(s): CHOL, HDL, LDLCALC, TRIG, CHOLHDL, LDLDIRECT in the last 72 hours. No results for input(s): TSH, T4TOTAL, T3FREE,  THYROIDAB in the last 72 hours.  Invalid input(s): FREET3 No results for input(s): VITAMINB12, FOLATE, FERRITIN, TIBC, IRON, RETICCTPCT in the last 72 hours.  Micro Results: No results found for this or any previous visit (from the past 240 hour(s)).   Radiological Exams on Admission: Dg Chest 2 View  Result Date: 05/20/2016 CLINICAL DATA:  Acute onset of substernal chest pain. Initial encounter. EXAM: CHEST  2 VIEW COMPARISON:  CT of the chest performed 03/26/2014 FINDINGS: The lungs are well-aerated. Chronic right-sided pleuroparenchymal scarring is noted, with right-sided volume loss. There is no evidence of pleural effusion or pneumothorax. The heart is borderline normal in size. No acute osseous  abnormalities are seen. IMPRESSION: No acute cardiopulmonary process seen. Chronic right-sided pleuroparenchymal scarring, with right-sided volume loss. Electronically Signed   By: Garald Balding M.D.   On: 05/20/2016 19:28    Assessment/Plan Present on Admission: . Chest pain -bring in 23 hours monitoring on medtele -baby ASA daily -cycle cardiac enzymes and lipid panel -resume home home  A flutter -new onset -TSH, 2D echo -monitor on telemetry  . COPD (chronic obstructive pulmonary disease) (HCC) -oxygen, duonebs PRN  . HTN (hypertension) -stable, resume home medications  . Severe protein-calorie malnutrition ([place]) -aware  Severe dementia -aware, stable, home meds resumed   Annis Lagoy 05/20/2016, 8:38 PM

## 2016-05-20 NOTE — Progress Notes (Addendum)
Patient is admitted to room 251 with the diagnosis of chest pain. Alert and oriented to self. Patient is  impulsive and uncooperative since admitted, refused to go to his room. Code 300 called. The supervisor Joel Summers) came in and talked to Dr. Jannifer Franklin for Haldol 2 mg  Iv x 1 dose and administered to the patient. Patient refused the cardiac monitor and  other interventions. Requested for a safety sitter but non is available right now. RN and the NTare alternating in sitting. No acute distress noted, and no s/s of pain. Will continue to monitor.

## 2016-05-20 NOTE — ED Triage Notes (Signed)
Patient comes in via [facility] from [facility] with the complaint of chest pain. Per EMS patient was having 10/10 chest pain substernal radiating across his chest this morning. Gave 3 nitros without relief and Aspirin '324mg'$ . Patient does have dementia and does not know why he is here. Denies chest pain at this time.

## 2016-05-20 NOTE — ED Provider Notes (Signed)
[facility] Emergency Department Provider Note   ____________________________________________   First MD Initiated Contact with Patient 05/20/16 1832     (approximate)  I have reviewed the triage vital signs and the nursing notes.   HISTORY  Chief Complaint Chest Pain  EM caveat: The patient has severe dementia  HPI QUINNTON BURY is a 83 y.o. male fairly advanced dementiawho is noting severe chest pain 10 out of 10 in his nursing facility. He was given 324 mg of aspirin, 3 nitroglycerin tablets without relief prompting ER evaluation  On arrival here the patient reports some discomfort across his upper chest, but denies being in pain. Denies shortness of breath.  Patient can't really recall any else. Reports he feels "just fine"   Past Medical History:  Diagnosis Date  . Altered mental status   . Cancer ([place])   . COPD (chronic obstructive pulmonary disease) ([place])   . Dementia   . Diabetes mellitus without complication ([place])   . Dysphagia   . Hypertension   . Lung cancer [facility])     Patient Active Problem List   Diagnosis Date Noted  . Acute respiratory failure with hypoxia ([place]) 09/25/2012  . Diabetic neuropathy ([place]) 09/25/2012  . Severe protein-calorie malnutrition ([place]) 09/25/2012  . COPD (chronic obstructive pulmonary disease) ([place]) 09/20/2012  . HCAP (healthcare-associated pneumonia) 09/20/2012  . Anemia 09/20/2012  . Hypokalemia 09/20/2012  . Leukocytosis 09/20/2012  . Dementia 09/20/2012  . Lung mass 09/20/2012  . DM (diabetes mellitus) ([place]) 09/20/2012  . HTN (hypertension) 09/20/2012    Past Surgical History:  Procedure Laterality Date  . VIDEO BRONCHOSCOPY Bilateral 09/24/2012   Procedure: VIDEO BRONCHOSCOPY WITHOUT FLUORO;  Surgeon: [name], MD;  [facility];  Service: Cardiopulmonary;  Laterality: Bilateral;    Prior to Admission medications   Medication Sig Start Date End Date Taking? Authorizing  Provider  acetaminophen (TYLENOL) 500 MG tablet Take 500 mg by mouth every 6 (six) hours as needed for pain.   Yes Historical Provider, MD  acidophilus (RISAQUAD) CAPS capsule Take 1 capsule by mouth daily. For 20 days    Historical Provider, MD  albuterol (PROVENTIL HFA;VENTOLIN HFA) 108 (90 BASE) MCG/ACT inhaler Inhale 1-2 puffs into the lungs every 4 (four) hours as needed for wheezing or shortness of breath. 09/20/12   Linton Flemings, MD  albuterol (PROVENTIL) 2 MG tablet Take 2 mg by mouth 3 (three) times daily. For 5 days    Historical Provider, MD  amoxicillin-clavulanate (AUGMENTIN) 500-125 MG per tablet Take 1 tablet (500 mg total) by mouth 3 (three) times daily. For 3 more days. 09/27/12   Venetia Maxon Rama, MD  aspirin 81 MG chewable tablet Chew 81 mg by mouth daily.    Historical Provider, MD  bisacodyl (DULCOLAX) 10 MG suppository Place 1 suppository (10 mg total) rectally daily. 09/27/12   Venetia Maxon Rama, MD  citalopram (CELEXA) 20 MG tablet Take 20 mg by mouth daily.    Historical Provider, MD  divalproex (DEPAKOTE ER) 500 MG 24 hr tablet Take 500 mg by mouth 2 (two) times daily.    Historical Provider, MD  donepezil (ARICEPT) 10 MG tablet Take 10 mg by mouth at bedtime.    Historical Provider, MD  felodipine (PLENDIL) 5 MG 24 hr tablet Take 5 mg by mouth daily.    Historical Provider, MD  gabapentin (NEURONTIN) 100 MG capsule Take 100 mg by mouth 3 (three) times daily.    Historical Provider, MD  guaiFENesin (  ROBITUSSIN) 100 MG/5ML liquid Take 200 mg by mouth 3 (three) times daily as needed for cough.    Historical Provider, MD  haloperidol (HALDOL) 0.5 MG tablet Take 1 tablet (0.5 mg total) by mouth 2 (two) times daily. 09/27/12   Venetia Maxon Rama, MD  hydrochlorothiazide (HYDRODIURIL) 25 MG tablet Take 25 mg by mouth daily.    Historical Provider, MD  HYDROcodone-acetaminophen (NORCO/VICODIN) 5-325 MG per tablet Take 1-2 tablets by mouth every 4 (four) hours as needed. 09/27/12   Venetia Maxon  Rama, MD  magnesium hydroxide (MILK OF MAGNESIA) 400 MG/5ML suspension Take 30 mLs by mouth daily as needed for constipation.    Historical Provider, MD  Memantine HCl ER (NAMENDA XR) 14 MG CP24 Take by mouth.    Historical Provider, MD  metFORMIN (GLUCOPHAGE) 500 MG tablet Take 500 mg by mouth daily with breakfast.    Historical Provider, MD  Nutritional Supplements (FEEDING SUPPLEMENT, JEVITY 1.2 CAL,) LIQD Place 1,000 mL into feeding tube continuous. 09/27/12   Venetia Maxon Rama, MD  potassium chloride 20 MEQ/15ML (10%) solution Take 15 mL (20 mEq total) by mouth 2 (two) times daily. 09/27/12   Venetia Maxon Rama, MD  Water For Irrigation, Sterile (FREE WATER) SOLN Place 200 mL into feeding tube every 8 (eight) hours. 09/27/12   Venetia Maxon Rama, MD    Allergies Ativan [lorazepam]  No family history on file.  Social History Social History  Substance Use Topics  . Smoking status: Former Smoker    Quit date: 09/22/1978  . Smokeless tobacco: Never Used  . Alcohol use No    Review of Systems EM caveat ____________________________________________   PHYSICAL EXAM:  VITAL SIGNS: ED Triage Vitals  Enc Vitals Group     BP 05/20/16 1835 (!) 148/70     Pulse --      Resp 05/20/16 1835 (!) 22     Temp 05/20/16 1835 99.8 F (37.7 C)     Temp Source 05/20/16 1835 Oral     SpO2 05/20/16 1835 94 %     Weight 05/20/16 1829 200 lb (90.7 kg)     Height --      Head Circumference --      Peak Flow --      Pain Score --      Pain Loc --      Pain Edu? --      Excl. in [place]? --     Constitutional: Alert and oriented to self but not to time or situation. Well appearing and in no acute distress. Eyes: Conjunctivae are normal. PERRL. EOMI. Head: Atraumatic. Nose: No congestion/rhinnorhea. Mouth/Throat: Mucous membranes are moist.  Oropharynx non-erythematous. Neck: No stridor.   Cardiovascular: Normal rate, regular rhythm. Grossly normal heart sounds.  Good peripheral  circulation. Respiratory: Normal respiratory effort.  No retractions. Lungs CTAB except question minimal decreased sounds over the R lung. Gastrointestinal: Soft and nontender. No distention. Gastrostomy tube Musculoskeletal: No lower extremity tenderness nor edema.   Neurologic:  Normal language. He doesn't speak much, but will answer simple questions when asked. He follows commands, moves all extremities well. Smiles. Skin:  Skin is warm, dry and intact. No notable peripheral edema or venous congestion. No thigh tenderness. Psychiatric: Mood and affect are slightly flat  ____________________________________________   LABS (all labs ordered are listed, but only abnormal results are displayed)  Labs Reviewed  BASIC METABOLIC PANEL - Abnormal; Notable for the following:       Result Value   Chloride  98 (*)    Glucose, Bld 119 (*)    BUN 23 (*)    Calcium 8.7 (*)    All other components within normal limits  CBC - Abnormal; Notable for the following:    RBC 4.27 (*)    HCT 39.4 (*)    RDW 14.7 (*)    All other components within normal limits  URINALYSIS, COMPLETE (UACMP) WITH MICROSCOPIC - Abnormal; Notable for the following:    Color, Urine YELLOW (*)    APPearance CLEAR (*)    Squamous Epithelial / LPF 0-5 (*)    All other components within normal limits  LIPASE, BLOOD - Abnormal; Notable for the following:    Lipase <10 (*)    All other components within normal limits  TROPONIN I  HEPATIC FUNCTION PANEL   ____________________________________________  EKG  Reviewed and interpreted me at 1833 Heart rate 70 QRS 140 QTc 480 Probable atrial flutter, some artifact, but no notable ST abnormalities ____________________________________________  RADIOLOGY  Dg Chest 2 View  Result Date: 05/20/2016 CLINICAL DATA:  Acute onset of substernal chest pain. Initial encounter. EXAM: CHEST  2 VIEW COMPARISON:  CT of the chest performed 03/26/2014 FINDINGS: The lungs are well-aerated.  Chronic right-sided pleuroparenchymal scarring is noted, with right-sided volume loss. There is no evidence of pleural effusion or pneumothorax. The heart is borderline normal in size. No acute osseous abnormalities are seen. IMPRESSION: No acute cardiopulmonary process seen. Chronic right-sided pleuroparenchymal scarring, with right-sided volume loss. Electronically Signed   By: Garald Balding M.D.   On: 05/20/2016 19:28    ____________________________________________   PROCEDURES  Procedure(s) performed: None  Procedures  Critical Care performed: No  ____________________________________________   INITIAL IMPRESSION / ASSESSMENT AND PLAN / ED COURSE  Pertinent labs & imaging results that were available during my care of the patient were reviewed by me and considered in my medical decision making (see chart for details).    Patient presents for evaluation of chest pain. Of note, appears that he is in atrial flutter, which is likely a new diagnosis. He is currently not in severe pain but did note a slight abnormal feeling in his chest, otherwise somewhat hard to obtain a good review of systems. Does not appear in any acute extremities his vital signs are stable, except for a questionable low-grade temperature which may be related. We will obtain an x-ray to evaluate for infiltrate, troponin, and closely monitor.   ----------------------------------------- 8:20 PM on 05/20/2016 -----------------------------------------  Patient remained stable. After placing nitroglycerin a, patient has no further complaint of discomfort in the chest. Resting comfortably at this time. Given the patient's new onset aflutter, rate controlled and associated chest pain/discomfort we will admit the patient for ongoing management to the hospitalist service.  ____________________________________________   FINAL CLINICAL IMPRESSION(S) / ED DIAGNOSES  Final diagnoses:  New onset atrial flutter (HCC)   Chest pain, moderate coronary artery risk      NEW MEDICATIONS STARTED DURING THIS VISIT:  New Prescriptions   No medications on file     Note:  This document was prepared using Dragon voice recognition software and may include unintentional dictation errors.     Delman Kitten, MD 05/20/16 2021

## 2016-05-20 NOTE — Progress Notes (Signed)
Lovenox changed to 40 mg for BMI <40 and CrCl >30. CrCl ~55 mL/min based on estimated height of 5'5" per floor RN.

## 2016-05-21 ENCOUNTER — Observation Stay (HOSPITAL_BASED_OUTPATIENT_CLINIC_OR_DEPARTMENT_OTHER)
Admit: 2016-05-21 | Discharge: 2016-05-21 | Disposition: A | Discharge status: Home / Self Care | Payer: Medicare Other | Attending: Internal Medicine | Admitting: Internal Medicine

## 2016-05-21 DIAGNOSIS — I4891 Unspecified atrial fibrillation: Secondary | ICD-10-CM | POA: Diagnosis not present

## 2016-05-21 DIAGNOSIS — R079 Chest pain, unspecified: Secondary | ICD-10-CM | POA: Diagnosis not present

## 2016-05-21 LAB — CBC
HEMATOCRIT: 37.3 % — AB (ref 40.0–52.0)
HEMOGLOBIN: 12.8 g/dL — AB (ref 13.0–18.0)
MCH: 31.7 pg (ref 26.0–34.0)
MCHC: 34.4 g/dL (ref 32.0–36.0)
MCV: 92.1 fL (ref 80.0–100.0)
Platelets: 156 10*3/uL (ref 150–440)
RBC: 4.05 MIL/uL — AB (ref 4.40–5.90)
RDW: 15 % — ABNORMAL HIGH (ref 11.5–14.5)
WBC: 7.9 10*3/uL (ref 3.8–10.6)

## 2016-05-21 LAB — BASIC METABOLIC PANEL
ANION GAP: 6 (ref 5–15)
BUN: 19 mg/dL (ref 6–20)
CHLORIDE: 104 mmol/L (ref 101–111)
CO2: 27 mmol/L (ref 22–32)
Calcium: 8.5 mg/dL — ABNORMAL LOW (ref 8.9–10.3)
Creatinine, Ser: 0.87 mg/dL (ref 0.61–1.24)
GFR calc non Af Amer: >60 mL/min (ref >60)
GLUCOSE: 102 mg/dL — AB (ref 65–99)
Potassium: 3.6 mmol/L (ref 3.5–5.1)
Sodium: 137 mmol/L (ref 135–145)

## 2016-05-21 LAB — CKMB (ARMC ONLY)
CK, MB: 2.4 ng/mL (ref 0.5–5.0)
CK, MB: 1.8 ng/mL (ref 0.5–5.0)

## 2016-05-21 LAB — TROPONIN I: TROPONIN I: 0.03 ng/mL — AB (ref <0.03)

## 2016-05-21 LAB — ECHOCARDIOGRAM COMPLETE: Weight: 3200 oz

## 2016-05-21 MED ORDER — HYDROCODONE-ACETAMINOPHEN 5-325 MG PO TABS: 1.0000 | ORAL_TABLET | ORAL | Status: DC | PRN

## 2016-05-21 MED ORDER — DONEPEZIL HCL 5 MG PO TABS
10.0000 mg | ORAL_TABLET | Freq: Every day | ORAL | Status: DC
Start: 2016-05-21 — End: 2016-05-22
  Administered 2016-05-21: 10 mg
  Filled 2016-05-21: qty 2

## 2016-05-21 MED ORDER — METOPROLOL TARTRATE 25 MG/10 ML ORAL SUSPENSION: 12.5000 mg | Freq: Two times a day (BID) | ORAL | Status: DC

## 2016-05-21 MED ORDER — JEVITY 1.5 CAL/FIBER PO LIQD
237.0000 mL | Freq: Four times a day (QID) | ORAL | Status: DC
Start: 2016-05-21 — End: 2016-05-22
  Administered 2016-05-21 – 2016-05-22 (×4): 237 mL

## 2016-05-21 MED ORDER — HYDROCHLOROTHIAZIDE 25 MG PO TABS
25.0000 mg | ORAL_TABLET | Freq: Every day | ORAL | Status: DC
Start: 2016-05-21 — End: 2016-05-22
  Administered 2016-05-21 – 2016-05-22 (×2): 25 mg
  Filled 2016-05-21 (×2): qty 1

## 2016-05-21 MED ORDER — METFORMIN HCL 500 MG PO TABS
500.0000 mg | ORAL_TABLET | Freq: Every day | ORAL | Status: DC
  Administered 2016-05-21 – 2016-05-22 (×2): 500 mg
  Filled 2016-05-21 (×2): qty 1

## 2016-05-21 MED ORDER — MAGNESIUM HYDROXIDE 400 MG/5ML PO SUSP: 30.0000 mL | Freq: Every day | ORAL | Status: DC | PRN

## 2016-05-21 MED ORDER — ASPIRIN 81 MG PO CHEW
81.0000 mg | CHEWABLE_TABLET | Freq: Every day | ORAL | Status: DC
  Administered 2016-05-21 – 2016-05-22 (×2): 81 mg
  Filled 2016-05-21 (×2): qty 1

## 2016-05-21 MED ORDER — ACETAMINOPHEN 650 MG RE SUPP: 650.0000 mg | Freq: Four times a day (QID) | RECTAL | Status: DC | PRN

## 2016-05-21 MED ORDER — METOPROLOL TARTRATE 25 MG PO TABS
12.5000 mg | ORAL_TABLET | Freq: Two times a day (BID) | ORAL | Status: DC
  Filled 2016-05-21: qty 1

## 2016-05-21 MED ORDER — HALOPERIDOL 0.5 MG PO TABS
0.5000 mg | ORAL_TABLET | Freq: Two times a day (BID) | ORAL | Status: DC
  Administered 2016-05-21 – 2016-05-22 (×3): 0.5 mg
  Filled 2016-05-21 (×3): qty 1

## 2016-05-21 MED ORDER — ACETAMINOPHEN 325 MG PO TABS: 650.0000 mg | ORAL_TABLET | Freq: Four times a day (QID) | ORAL | Status: DC | PRN

## 2016-05-21 MED ORDER — GUAIFENESIN 100 MG/5ML PO SOLN: 200.0000 mg | Freq: Three times a day (TID) | ORAL | Status: DC | PRN

## 2016-05-21 MED ORDER — MAGNESIUM SULFATE 2 GM/50ML IV SOLN
2.0000 g | Freq: Once | INTRAVENOUS | Status: AC
  Administered 2016-05-21: 2 g via INTRAVENOUS
  Filled 2016-05-21: qty 50

## 2016-05-21 MED ORDER — HALOPERIDOL LACTATE 5 MG/ML IJ SOLN
5.0000 mg | Freq: Once | INTRAMUSCULAR | Status: AC
  Administered 2016-05-21: 5 mg via INTRAVENOUS
  Filled 2016-05-21: qty 1

## 2016-05-21 MED ORDER — CITALOPRAM HYDROBROMIDE 20 MG PO TABS
20.0000 mg | ORAL_TABLET | Freq: Every day | ORAL | Status: DC
  Administered 2016-05-21 – 2016-05-22 (×2): 20 mg
  Filled 2016-05-21 (×2): qty 1

## 2016-05-21 NOTE — Progress Notes (Signed)
Pt is cooperative but remains onfused this am, ate well , EKG done, HR 62-64  Md aware, Safety sitter removed as pt appears cooprative

## 2016-05-21 NOTE — Progress Notes (Signed)
*  PRELIMINARY RESULTS* Echocardiogram 2D Echocardiogram has been performed.  Joel Summers 05/21/2016, 1:39 PM

## 2016-05-21 NOTE — NC FL2 (Signed)
[place] LEVEL OF CARE SCREENING TOOL     IDENTIFICATION  Patient Name: Joel Summers Birthdate: [birth year] Sex: male Admission Date (Current Location): 05/20/2016  [place] and [place] Number:  Engineering geologist and Address:  [facility], [address] [place], [place] [address]      Provider Number: [id number]  Attending Physician Name and Address:  Loletha Grayer, MD  Relative Name and Phone Number:       Current Level of Care: Hospital Recommended Level of Care: [place] Prior Approval Number:    Date Approved/Denied:   PASRR Number: [id number]  Discharge Plan: SNF    Current Diagnoses: Patient Active Problem List   Diagnosis Date Noted  . Chest pain 05/20/2016  . Dementia due to Parkinson's disease without behavioral disturbance ([place]) 05/20/2016  . Acute respiratory failure with hypoxia ([place]) 09/25/2012  . Diabetic neuropathy ([place]) 09/25/2012  . Severe protein-calorie malnutrition ([place]) 09/25/2012  . COPD (chronic obstructive pulmonary disease) ([place]) 09/20/2012  . HCAP (healthcare-associated pneumonia) 09/20/2012  . Anemia 09/20/2012  . Hypokalemia 09/20/2012  . Leukocytosis 09/20/2012  . Dementia 09/20/2012  . Lung mass 09/20/2012  . DM (diabetes mellitus) ([place]) 09/20/2012  . HTN (hypertension) 09/20/2012    Orientation RESPIRATION BLADDER Height & Weight     Self  Normal Continent Weight: 200 lb (90.7 kg) Height:     BEHAVIORAL SYMPTOMS/MOOD NEUROLOGICAL BOWEL NUTRITION STATUS      Continent Diet (Cardiac)  AMBULATORY STATUS COMMUNICATION OF NEEDS Skin   Extensive Assist Verbally Normal                       Personal Care Assistance Level of Assistance  Bathing, Feeding, Dressing Bathing Assistance: Maximum assistance Feeding assistance: Limited assistance Dressing Assistance: Maximum assistance     Functional Limitations Info             SPECIAL CARE FACTORS FREQUENCY                      Contractures Contractures Info: Present    Additional Factors Info  Allergies   Allergies Info: Ativan (Lorazepam)           Current Medications (05/21/2016):  This is the current hospital active medication list Current Facility-Administered Medications  Medication Dose Route Frequency Provider Last Rate Last Dose  . acetaminophen (TYLENOL) tablet 650 mg  650 mg Per Tube Q6H PRN Harrie Foreman, MD       Or  . acetaminophen (TYLENOL) suppository 650 mg  650 mg Rectal Q6H PRN Harrie Foreman, MD      . acidophilus (RISAQUAD) capsule 1 capsule  1 capsule Oral Daily Quintella Baton, MD   1 capsule at 05/21/16 0937  . albuterol (PROVENTIL) (2.5 MG/3ML) 0.083% nebulizer solution 2.5 mg  2.5 mg Nebulization Q2H PRN Debby Crosley, MD      . aspirin chewable tablet 81 mg  81 mg Per Tube Daily Harrie Foreman, MD   81 mg at 05/21/16 9509  . bisacodyl (DULCOLAX) suppository 10 mg  10 mg Rectal Daily Debby Crosley, MD      . citalopram (CELEXA) tablet 20 mg  20 mg Per Tube Daily Harrie Foreman, MD   20 mg at 05/21/16 3267  . divalproex (DEPAKOTE ER) 24 hr tablet 500 mg  500 mg Oral BID Harrie Foreman, MD   500 mg at 05/21/16 0940  . donepezil (ARICEPT) tablet 10 mg  10 mg Per Tube QHS Harrie Foreman, MD      . enoxaparin (LOVENOX) injection 40 mg  40 mg Subcutaneous Q24H Debby Crosley, MD      . feeding supplement (JEVITY 1.5 CAL/FIBER) liquid 237 mL  237 mL Per Tube QID Loletha Grayer, MD   237 mL at 05/21/16 1412  . felodipine (PLENDIL) 24 hr tablet 5 mg  5 mg Oral Daily Harrie Foreman, MD   5 mg at 05/21/16 0940  . free water 200 mL  200 mL Per Tube Q8H Debby Crosley, MD   200 mL at 05/21/16 1412  . gabapentin (NEURONTIN) capsule 100 mg  100 mg Oral TID Harrie Foreman, MD   100 mg at 05/21/16 1600  . guaiFENesin (ROBITUSSIN) 100 MG/5ML solution 200 mg  200 mg Per Tube TID PRN Harrie Foreman, MD      . haloperidol (HALDOL) tablet 0.5 mg  0.5 mg  Per Tube BID Harrie Foreman, MD   0.5 mg at 05/21/16 5374  . hydrochlorothiazide (HYDRODIURIL) tablet 25 mg  25 mg Per Tube Daily Harrie Foreman, MD   25 mg at 05/21/16 8270  . HYDROcodone-acetaminophen (NORCO/VICODIN) 5-325 MG per tablet 1-2 tablet  1-2 tablet Per Tube Q4H PRN Harrie Foreman, MD      . magnesium hydroxide (MILK OF MAGNESIA) suspension 30 mL  30 mL Per Tube Daily PRN Harrie Foreman, MD      . magnesium sulfate IVPB 2 g 50 mL  2 g Intravenous Once Loletha Grayer, MD   2 g at 05/21/16 1554  . memantine (NAMENDA XR) 24 hr capsule 14 mg  14 mg Oral Daily Harrie Foreman, MD   14 mg at 05/21/16 7867  . metFORMIN (GLUCOPHAGE) tablet 500 mg  500 mg Per Tube Q breakfast Harrie Foreman, MD   500 mg at 05/21/16 0936  . QUEtiapine (SEROQUEL) tablet 25 mg  25 mg Per J Tube QHS Delman Kitten, MD   25 mg at 05/20/16 1944  . sodium chloride flush (NS) 0.9 % injection 3 mL  3 mL Intravenous Q12H Debby Crosley, MD   3 mL at 05/21/16 1131     Discharge Medications: Please see discharge summary for a list of discharge medications.  Relevant Imaging Results:  Relevant Lab Results:   Additional Information SS# [id number]  Zettie Pho, LCSW

## 2016-05-21 NOTE — Progress Notes (Signed)
Dr. Marcille Blanco notified to change patient's medications through the tube instead of oral.  Admission profile not done due to patient being confused . Will be done when the family gets to the bedside. Will continue to monitor.

## 2016-05-21 NOTE — Clinical Social Work Note (Signed)
Clinical Social Work Assessment  Patient Details  Name: Joel Summers MRN: [id number] Date of Birth: Jan 03, 1931  Date of referral:  05/21/16               Reason for consult:  Facility Placement                Permission sought to share information with:  Facility Art therapist granted to share information::  Yes, Verbal Permission Granted  Name::        Agency::     Relationship::     Contact Information:     Housing/Transportation Living arrangements for the past 2 months:  [place] of Information:  Facility Patient Interpreter Needed:  None Criminal Activity/Legal Involvement Pertinent to Current Situation/Hospitalization:  No - Comment as needed Significant Relationships:  None Lives with:  Facility Resident Do you feel safe going back to the place where you live?  Yes Need for family participation in patient care:  Yes (Comment) (Patient has legal guardian: [name])  Care giving concerns:  Patient admitted from [facility]   Social Worker assessment / plan:  CSW met with patient at bedside to discuss dc planning. Patient was not oriented sufficiently to conduct assessment. CSW attempted to contact the patient's legal guardian, [name] with [place] [facility] and left a message updating her that he had been admitted and gave her contact information for the receiving CSW. CSW contact WOM and was informed that the patient should be able to return when stable via EMS, but admissions would have to give final clearance.  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  Patient/Family's Understanding of and Emotional Response to Diagnosis, Current Treatment, and Prognosis:  Patient does not understand his current needs. Patient has a legal  guardian.  Emotional Assessment Appearance:  Appears stated age Attitude/Demeanor/Rapport:  Guarded (Confused) Affect (typically observed):  Quiet Orientation:  Oriented to Self Alcohol / Substance use:  Never Used Psych involvement (Current and /or in the community):  No (Comment)  Discharge Needs  Concerns to be addressed:  Care Coordination, Discharge Planning Concerns Readmission within the last 30 days:  No Current discharge risk:  None Barriers to Discharge:  Continued Medical Work up   [employer], LCSW 05/21/2016, 4:34 PM

## 2016-05-21 NOTE — Progress Notes (Signed)
Patient is impulsive and not  following instructions. Staff has been in his bed side multiple times. Dr. Jannifer Franklin notified with a new order for 1 time dose of haldol 5 mg  IV. Will continue to monitor.

## 2016-05-21 NOTE — Progress Notes (Signed)
Patient still refused  to wear the telemetry monitor. 2nd  Troponin was positive (0.03). Dr. Jannifer Franklin notified with no new order. Patient is chest pain free. Will continue to monitor.

## 2016-05-21 NOTE — Progress Notes (Signed)
Patient ID: Joel Summers, male   DOB: [birth year], 83 y.o.   MRN: [id number]  [facility] PROGRESS NOTE  ISIAC BREIGHNER LPF:[id number] DOB: 1930-12-15 DOA: 05/20/2016 PCP: Sande Brothers, MD  HPI/Subjective: Patient does not know why he is here. Feels okay and offers no complaints.  Objective: Vitals:   05/21/16 0600 05/21/16 0931  BP: 123/83 (!) 114/47  Pulse: 91 67  Resp: 16 16  Temp: 98 F (36.7 C) 98.4 F (36.9 C)    Filed Weights   05/20/16 1829  Weight: 90.7 kg (200 lb)    ROS: Review of Systems  Constitutional: Negative for chills and fever.  Eyes: Negative for blurred vision.  Respiratory: Negative for cough and shortness of breath.   Cardiovascular: Negative for chest pain.  Gastrointestinal: Negative for abdominal pain, constipation, diarrhea, nausea and vomiting.  Genitourinary: Negative for dysuria.  Musculoskeletal: Negative for joint pain.  Neurological: Negative for dizziness and headaches.   Exam: Physical Exam  Constitutional: He is oriented to person, place, and time.  HENT:  Nose: No mucosal edema.  Mouth/Throat: No oropharyngeal exudate or posterior oropharyngeal edema.  Eyes: Conjunctivae, EOM and lids are normal. Pupils are equal, round, and reactive to light.  Neck: No JVD present. Carotid bruit is not present. No edema present. No thyroid mass and no thyromegaly present.  Cardiovascular: S1 normal and S2 normal.  An irregular rhythm present. Exam reveals no gallop.   No murmur heard. Pulses:      Dorsalis pedis pulses are 2+ on the right side, and 2+ on the left side.  Respiratory: No respiratory distress. He has no wheezes. He has no rhonchi. He has no rales.  GI: Soft. Bowel sounds are normal. There is no tenderness.  Musculoskeletal:       Right ankle: He exhibits no swelling.       Left ankle: He exhibits no swelling.  Lymphadenopathy:    He has no cervical adenopathy.  Neurological: He is alert and oriented to person, place, and  time. No cranial nerve deficit.  Skin: Skin is warm. No rash noted. Nails show no clubbing.  Psychiatric: He has a normal mood and affect.      Data Reviewed: Basic Metabolic Panel:  Recent Labs Lab 05/20/16 1837 05/21/16 0425  NA 135 137  K 4.0 3.6  CL 98* 104  CO2 31 27  GLUCOSE 119* 102*  BUN 23* 19  CREATININE 0.89 0.87  CALCIUM 8.7* 8.5*   Liver Function Tests:  Recent Labs Lab 05/20/16 1837  AST 30  ALT 19  ALKPHOS 79  BILITOT 0.7  PROT 7.2  ALBUMIN 3.9    Recent Labs Lab 05/20/16 1837  LIPASE <10*   CBC:  Recent Labs Lab 05/20/16 1837 05/21/16 0425  WBC 7.4 7.9  HGB 13.3 12.8*  HCT 39.4* 37.3*  MCV 92.3 92.1  PLT 162 156   Cardiac Enzymes:  Recent Labs Lab 05/20/16 1837 05/20/16 2241 05/21/16 0425 05/21/16 1029  CKMB  --  3.1 2.4 1.8  TROPONINI <0.03 0.03* 0.03*  --     Studies: Dg Chest 2 View  Result Date: 05/20/2016 CLINICAL DATA:  Acute onset of substernal chest pain. Initial encounter. EXAM: CHEST  2 VIEW COMPARISON:  CT of the chest performed 03/26/2014 FINDINGS: The lungs are well-aerated. Chronic right-sided pleuroparenchymal scarring is noted, with right-sided volume loss. There is no evidence of pleural effusion or pneumothorax. The heart is borderline normal in size. No acute osseous abnormalities are seen. IMPRESSION: No  acute cardiopulmonary process seen. Chronic right-sided pleuroparenchymal scarring, with right-sided volume loss. Electronically Signed   By: Garald Balding M.D.   On: 05/20/2016 19:28    Scheduled Meds: . acidophilus  1 capsule Oral Daily  . aspirin  81 mg Per Tube Daily  . bisacodyl  10 mg Rectal Daily  . citalopram  20 mg Per Tube Daily  . divalproex  500 mg Oral BID  . donepezil  10 mg Per Tube QHS  . enoxaparin (LOVENOX) injection  40 mg Subcutaneous Q24H  . feeding supplement (JEVITY 1.5 CAL/FIBER)  237 mL Per Tube QID  . felodipine  5 mg Oral Daily  . free water  200 mL Per Tube Q8H  .  gabapentin  100 mg Oral TID  . haloperidol  0.5 mg Per Tube BID  . hydrochlorothiazide  25 mg Per Tube Daily  . memantine  14 mg Oral Daily  . metFORMIN  500 mg Per Tube Q breakfast  . QUEtiapine  25 mg Per J Tube QHS  . sodium chloride flush  3 mL Intravenous Q12H    Assessment/Plan:  1. Atrial flutter. Heart rate on the lower side and unable to give any medications. Patient refusing telemetry monitoring secondary to dementia. Echocardiogram was also difficult secondary to dementia. We'll give IV magnesium.  Check a TSH. I would not put a patient with dementia on major anticoagulation at this point. 2. Dementia with behavioral disturbance on Depakote, Celexa, Aricept, Haldol, Namenda and Seroquel 3. Dysphagia on feeding supplementation 4. Essential hypertension on hydrochlorothiazide 5. Type 2 diabetes on metformin  Code Status:     Code Status Orders        Start     Ordered   05/20/16 2224  Full code  Continuous     05/20/16 2223    Code Status History    Date Active Date Inactive Code Status Order ID Comments User Context   09/24/2012 11:57 AM 09/27/2012  9:47 PM DNR [id number]  [name], MD Inpatient   09/20/2012  7:12 PM 09/24/2012 11:57 AM Full Code [id number]  Robbie Lis, MD Inpatient    Advance Directive Documentation     Most Recent Value  Type of Advance Directive  Out of facility DNR (pink MOST or yellow form)  Pre-existing out of facility DNR order (yellow form or pink MOST form)  Yellow form placed in chart (order not valid for inpatient use)  "MOST" Form in Place?  -     Family Communication: Patient has a guardian and this out of facility Disposition Plan: Potential back to facility tomorrow  Time spent: 28 minutes  Leslye Peer, [employer]

## 2016-05-21 NOTE — Plan of Care (Signed)
Problem: Cardiac: Goal: Ability to achieve and maintain adequate cardiovascular perfusion will improve Pt up and ambulatory in room  With no complaints  Problem: Education: Goal: Understanding of cardiac disease, CV risk reduction, and recovery process will improve Given educational material on po meds carduiac cath and need to control HTN

## 2016-05-22 ENCOUNTER — Encounter: Payer: Self-pay | Admitting: Nurse Practitioner

## 2016-05-22 ENCOUNTER — Telehealth: Payer: Self-pay | Admitting: Cardiovascular Disease

## 2016-05-22 DIAGNOSIS — R079 Chest pain, unspecified: Secondary | ICD-10-CM | POA: Diagnosis not present

## 2016-05-22 DIAGNOSIS — I4892 Unspecified atrial flutter: Secondary | ICD-10-CM | POA: Diagnosis not present

## 2016-05-22 LAB — BASIC METABOLIC PANEL
ANION GAP: 7 (ref 5–15)
BUN: 18 mg/dL (ref 6–20)
CHLORIDE: 103 mmol/L (ref 101–111)
CO2: 27 mmol/L (ref 22–32)
Calcium: 8.7 mg/dL — ABNORMAL LOW (ref 8.9–10.3)
Creatinine, Ser: 0.84 mg/dL (ref 0.61–1.24)
GFR calc Af Amer: >60 mL/min (ref >60)
GFR calc non Af Amer: >60 mL/min (ref >60)
Glucose, Bld: 113 mg/dL — ABNORMAL HIGH (ref 65–99)
POTASSIUM: 3.5 mmol/L (ref 3.5–5.1)
SODIUM: 137 mmol/L (ref 135–145)

## 2016-05-22 LAB — MAGNESIUM: Magnesium: 2.1 mg/dL (ref 1.7–2.4)

## 2016-05-22 LAB — TSH: TSH: 2.257 u[IU]/mL (ref 0.350–4.500)

## 2016-05-22 MED ORDER — FREE WATER: 200.0000 mL | Freq: Three times a day (TID) | Status: DC

## 2016-05-22 MED ORDER — ASPIRIN 81 MG PO CHEW: 81.0000 mg | CHEWABLE_TABLET | Freq: Every day | ORAL | 0 refills | Status: AC

## 2016-05-22 MED ORDER — METFORMIN HCL 500 MG PO TABS: 500.0000 mg | ORAL_TABLET | Freq: Every day | ORAL | 0 refills | Status: DC

## 2016-05-22 MED ORDER — CLONAZEPAM 0.25 MG PO TBDP: 0.2500 mg | ORAL_TABLET | Freq: Two times a day (BID) | ORAL | 0 refills | Status: DC

## 2016-05-22 MED ORDER — MEMANTINE HCL 10 MG PO TABS
10.0000 mg | ORAL_TABLET | Freq: Two times a day (BID) | ORAL | 0 refills | Status: AC
Start: 2016-05-22 — End: ?

## 2016-05-22 MED ORDER — FELODIPINE ER 5 MG PO TB24: 5.0000 mg | ORAL_TABLET | Freq: Every day | ORAL | 0 refills | Status: AC

## 2016-05-22 MED ORDER — HALOPERIDOL 0.5 MG PO TABS: 0.5000 mg | ORAL_TABLET | Freq: Two times a day (BID) | ORAL | 0 refills | Status: AC

## 2016-05-22 MED ORDER — DIVALPROEX SODIUM ER 500 MG PO TB24: 500.0000 mg | ORAL_TABLET | Freq: Two times a day (BID) | ORAL | 0 refills | Status: AC

## 2016-05-22 MED ORDER — DONEPEZIL HCL 10 MG PO TABS: 10.0000 mg | ORAL_TABLET | Freq: Every day | ORAL | 0 refills | Status: AC

## 2016-05-22 NOTE — Progress Notes (Signed)
Patient woke to use the restroom. Now back in bed. No acute distress noted. Will continue to monitor.

## 2016-05-22 NOTE — Progress Notes (Signed)
Report called to [facility] to [place]. Transportation called at this time.

## 2016-05-22 NOTE — Progress Notes (Signed)
Patient has been resting quietly after the haldol medication. Respirations even and unlabored. Will continue to monitor.

## 2016-05-22 NOTE — Discharge Summary (Signed)
[place] at [place] NAME: Joel Summers    MR#:  [id number]  DATE OF BIRTH:  1930-08-12  DATE OF ADMISSION:  05/20/2016 ADMITTING PHYSICIAN: Quintella Baton, MD  DATE OF DISCHARGE: 05/22/2016  PRIMARY CARE PHYSICIAN: Sande Brothers, MD    ADMISSION DIAGNOSIS:  Chest pain, moderate coronary artery risk [R07.9] New onset atrial flutter ([place]) [I48.92]  DISCHARGE DIAGNOSIS:  Principal Problem:   Chest pain Active Problems:   COPD (chronic obstructive pulmonary disease) (H[facility])   DM (diabetes mellitus) ([place])   HTN (hypertension)   Severe protein-calorie malnutrition ([place])   Dementia due to Parkinson's disease without behavioral disturbance ([place])   SECONDARY DIAGNOSIS:   Past Medical History:  Diagnosis Date  . Altered mental status   . Atrial flutter ([place])    a. 05/2016 Noted on admission for c/p;  b. 05/2016 Echo: Poor windows, LV appears to be globally nl;  c. CHA2DS2VASc = 3.  . Cancer ([place])   . COPD (chronic obstructive pulmonary disease) ([place])   . Dementia   . Diabetes mellitus without complication ([place])   . Dysphagia   . Hypertension   . Lung cancer [facility])     HOSPITAL COURSE:   1. Atrial flutter on presentation during the hospital stay. The patient refused telemetry monitoring during the hospital stay. Repeated EKG showed continuous atrial flutter. Heart rate has been controlled. Because the patient has dementia and is pretty impulsive we decided not to give major anticoagulation at this time. Continue aspirin 81 mg daily. 2. Chest pain unspecified. The patient did not recall having chest pain and does not have any chest pain currently. Cardiac enzymes were negative. 3. Dementia with behavioral disturbance. Continue all psychiatric medications. 4. Essential hypertension on Plendil 5. History of dysphagia and feeding supplementation 6. Type 2 diabetes mellitus on metformin  7. Hypokalemia replace potassium and get rid of  hydrochlorothiazide  Would suggest that the PCP call his guardian to get a DO NOT RESUSCITATE order. Currently the patient listed as a full code.  DISCHARGE CONDITIONS:   Fair  CONSULTS OBTAINED:  Treatment Team:  Wellington Hampshire, MD  DRUG ALLERGIES:   Allergies  Allergen Reactions  . Ativan [Lorazepam]     DISCHARGE MEDICATIONS:   Current Discharge Medication List    CONTINUE these medications which have CHANGED   Details  aspirin 81 MG chewable tablet Place 1 tablet (81 mg total) into feeding tube daily. Qty: 30 tablet, Refills: 0    clonazePAM (KLONOPIN) 0.25 MG disintegrating tablet Place 1 tablet (0.25 mg total) into feeding tube 2 (two) times daily. Qty: 60 tablet, Refills: 0    divalproex (DEPAKOTE ER) 500 MG 24 hr tablet Take 1 tablet (500 mg total) by mouth 2 (two) times daily. Qty: 60 tablet, Refills: 0    donepezil (ARICEPT) 10 MG tablet Place 1 tablet (10 mg total) into feeding tube at bedtime. Qty: 30 tablet, Refills: 0    felodipine (PLENDIL) 5 MG 24 hr tablet Take 1 tablet (5 mg total) by mouth daily. Qty: 30 tablet, Refills: 0    haloperidol (HALDOL) 0.5 MG tablet Place 1 tablet (0.5 mg total) into feeding tube 2 (two) times daily. Qty: 60 tablet, Refills: 0    memantine (NAMENDA) 10 MG tablet Place 1 tablet (10 mg total) into feeding tube 2 (two) times daily. Qty: 60 tablet, Refills: 0    metFORMIN (GLUCOPHAGE) 500 MG tablet Place 1 tablet (500 mg total) into feeding tube daily with  breakfast. Qty: 30 tablet, Refills: 0    !! Water For Irrigation, Sterile (FREE WATER) SOLN Place 200 mLs into feeding tube every 8 (eight) hours.     !! - Potential duplicate medications found. Please discuss with provider.    CONTINUE these medications which have NOT CHANGED   Details  alum & mag hydroxide-simeth (MAALOX/MYLANTA) 200-200-20 MG/5ML suspension Take 30 mLs by mouth every 4 (four) hours as needed for indigestion.    bisacodyl (DULCOLAX) 10 MG  suppository Place 10 mg rectally daily as needed for moderate constipation.    escitalopram (LEXAPRO) 5 MG tablet Take 5 mg by mouth daily.    ipratropium-albuterol (DUONEB) 0.5-2.5 (3) MG/3ML SOLN Take 3 mLs by nebulization every 6 (six) hours as needed (wheezing/ dyspnea).    lactulose (CHRONULAC) 10 GM/15ML solution Take 10 g by mouth at bedtime.    Melatonin 3 MG TABS Take 6 mg by mouth at bedtime.    Nutritional Supplements (TWOCAL HN) LIQD Place 237 mLs into feeding tube 4 (four) times daily. Administer at 10:00, 14:00, 19:00, and 22:00    QUEtiapine (SEROQUEL) 25 MG tablet Take 12.5-25 mg by mouth 2 (two) times daily. Take 12.5 mg in the morning and 25 mg at bedtime    !! Water For Irrigation, Sterile (FREE WATER) SOLN Place 30 mLs into feeding tube as needed (with medication administration AND before/after bolus feeding).     !! - Potential duplicate medications found. Please discuss with provider.    STOP taking these medications     acidophilus (RISAQUAD) CAPS capsule      citalopram (CELEXA) 20 MG tablet      gabapentin (NEURONTIN) 100 MG capsule      guaiFENesin (ROBITUSSIN) 100 MG/5ML liquid      hydrochlorothiazide (HYDRODIURIL) 25 MG tablet      HYDROcodone-acetaminophen (NORCO/VICODIN) 5-325 MG per tablet      magnesium hydroxide (MILK OF MAGNESIA) 400 MG/5ML suspension      Memantine HCl ER (NAMENDA XR) 14 MG CP24          DISCHARGE INSTRUCTIONS:   Follow-up with PMD 3 days Follow-up with Dr. Fletcher Anon cardiology 2 weeks  If you experience worsening of your admission symptoms, develop shortness of breath, life threatening emergency, suicidal or homicidal thoughts you must seek medical attention immediately by calling 911 or calling your MD immediately  if symptoms less severe.  You Must read complete instructions/literature along with all the possible adverse reactions/side effects for all the Medicines you take and that have been prescribed to you. Take  any new Medicines after you have completely understood and accept all the possible adverse reactions/side effects.   Please note  You were cared for by a hospitalist during your hospital stay. If you have any questions about your discharge medications or the care you received while you were in the hospital after you are discharged, you can call the unit and asked to speak with the hospitalist on call if the hospitalist that took care of you is not available. Once you are discharged, your primary care physician will handle any further medical issues. Please note that NO REFILLS for any discharge medications will be authorized once you are discharged, as it is imperative that you return to your primary care physician (or establish a relationship with a primary care physician if you do not have one) for your aftercare needs so that they can reassess your need for medications and monitor your lab values.    Today  CHIEF COMPLAINT:   Chief Complaint  Patient presents with  . Chest Pain    HISTORY OF PRESENT ILLNESS:  Joel Summers  is a 83 y.o. male sent in for chest pain   VITAL SIGNS:  Blood pressure (!) 123/96, pulse (!) 123, temperature 97.6 F (36.4 C), resp. rate 15, weight 90.7 kg (200 lb), SpO2 95 %. Current heart rate in the 70s   PHYSICAL EXAMINATION:  GENERAL:  83 y.o.-year-old patient lying in the bed with no acute distress.  EYES: Pupils equal, round, reactive to light and accommodation. No scleral icterus. Extraocular muscles intact.  HEENT: Head atraumatic, normocephalic. Oropharynx and nasopharynx clear.  NECK:  Supple, no jugular venous distention. No thyroid enlargement, no tenderness.  LUNGS: Normal breath sounds bilaterally, no wheezing, rales,rhonchi or crepitation. No use of accessory muscles of respiration.  CARDIOVASCULAR: S1, S2 Irregular. No murmurs, rubs, or gallops.  ABDOMEN: Soft, non-tender, non-distended. Bowel sounds present. No organomegaly or mass.   EXTREMITIES: No pedal edema, cyanosis, or clubbing.  NEUROLOGIC: Cranial nerves II through XII are intact. Muscle strength 5/5 in all extremities. Sensation intact. Gait not checked.  PSYCHIATRIC: The patient is alert and answers yes or no questions.  SKIN: No obvious rash, lesion, or ulcer.   DATA REVIEW:   CBC  Recent Labs Lab 05/21/16 0425  WBC 7.9  HGB 12.8*  HCT 37.3*  PLT 156    Chemistries   Recent Labs Lab 05/20/16 1837  05/22/16 0518  NA 135  < > 137  K 4.0  < > 3.5  CL 98*  < > 103  CO2 31  < > 27  GLUCOSE 119*  < > 113*  BUN 23*  < > 18  CREATININE 0.89  < > 0.84  CALCIUM 8.7*  < > 8.7*  MG  --   --  2.1  AST 30  --   --   ALT 19  --   --   ALKPHOS 79  --   --   BILITOT 0.7  --   --   < > = values in this interval not displayed.  Cardiac Enzymes  Recent Labs Lab 05/21/16 0425  TROPONINI 0.03*     RADIOLOGY:  Dg Chest 2 View  Result Date: 05/20/2016 CLINICAL DATA:  Acute onset of substernal chest pain. Initial encounter. EXAM: CHEST  2 VIEW COMPARISON:  CT of the chest performed 03/26/2014 FINDINGS: The lungs are well-aerated. Chronic right-sided pleuroparenchymal scarring is noted, with right-sided volume loss. There is no evidence of pleural effusion or pneumothorax. The heart is borderline normal in size. No acute osseous abnormalities are seen. IMPRESSION: No acute cardiopulmonary process seen. Chronic right-sided pleuroparenchymal scarring, with right-sided volume loss. Electronically Signed   By: Garald Balding M.D.   On: 05/20/2016 19:28      Management plans discussed with the patient, And he is in agreement.  CODE STATUS:     Code Status Orders        Start     Ordered   05/20/16 2224  Full code  Continuous     05/20/16 2223    Code Status History    Date Active Date Inactive Code Status Order ID Comments User Context   09/24/2012 11:57 AM 09/27/2012  9:47 PM DNR [id number]  [name], MD Inpatient   09/20/2012  7:12 PM  09/24/2012 11:57 AM Full Code [id number]  Robbie Lis, MD Inpatient    Advance Directive Documentation     Most  Recent Value  Type of Advance Directive  -- [unknown]  Pre-existing out of facility DNR order (yellow form or pink MOST form)  Yellow form placed in chart (order not valid for inpatient use)  "MOST" Form in Place?  -      TOTAL TIME TAKING CARE OF THIS PATIENT: 35 minutes.    Loletha Grayer M.D on 05/22/2016 at 9:16 AM  Between 7am to 6pm - Pager - [phone]  After 6pm go to [email or web address] - password [email or web address]  [facility] Office  [phone]  CC: Primary care physician; Sande Brothers, MD

## 2016-05-22 NOTE — Telephone Encounter (Signed)
Patient currently inpatient at [facility] telemetry new consult ordered today for new onset afib  Per patient c berge saw patient this am and Dr. Fletcher Anon is on call    TCM ph [facility] for new afib needs 2 wk fu  Scheduled [place] 4/23 at 220 pm in TCM hold spot

## 2016-05-22 NOTE — Clinical Social Work Note (Signed)
Patient to be d/c'ed today to [facility].  Patient and family agreeable to plans will transport via ems RN to call report [phone].  Evette Cristal, MSW, [place]

## 2016-05-22 NOTE — Progress Notes (Signed)
Pt. Discharge to [facility] via ems. Transfer packet given to transport

## 2016-05-22 NOTE — Telephone Encounter (Signed)
Currently admitted.

## 2016-05-22 NOTE — Consult Note (Signed)
Cardiology Consult    Patient ID: Joel Summers MRN: [id number], DOB/AGE: [birth year]   Admit date: 05/20/2016 Date of Consult: 05/22/2016  Primary Physician: Sande Brothers, MD Primary Cardiologist: New - M. Fletcher Anon, MD  Requesting Provider: R. Wieting, MD  Patient Profile    Joel Summers is a 83 y.o. male with a history of Hypertension, lung cancer, diabetes, COPD, and dementia, who is being seen today for the evaluation of atrial flutter and chest pain at the request of Dr. Leslye Peer.  Past Medical History   Past Medical History:  Diagnosis Date  . Altered mental status   . Atrial flutter ([place])    a. 05/2016 Noted on admission for c/p;  b. 05/2016 Echo: Poor windows, LV appears to be globally nl;  c. CHA2DS2VASc = 3.  . Cancer ([place])   . COPD (chronic obstructive pulmonary disease) ([place])   . Dementia   . Diabetes mellitus without complication ([place])   . Dysphagia   . Hypertension   . Lung cancer [facility])     Past Surgical History:  Procedure Laterality Date  . VIDEO BRONCHOSCOPY Bilateral 09/24/2012   Procedure: VIDEO BRONCHOSCOPY WITHOUT FLUORO;  Surgeon: [name], MD;  [facility];  Service: Cardiopulmonary;  Laterality: Bilateral;     Allergies  Allergies  Allergen Reactions  . Ativan [Lorazepam]     History of Present Illness    83 year old male without prior cardiac history. He does have a history of hypertension, diabetes, lung cancer, and dementia. He lives at [facility] and has a legal guardian provided for him by the county. He was taken to the emergency department April 7 apparently because of complaining of chest pain while at [facility]. Due to dementia, patient does not recall this. In the emergency department, he was found to be in rate-controlled atrial flutter, which is a new diagnosis for him. He currently denies chest pain, dyspnea, or palpitations. He is here to get up out of bed. He is disoriented to place and time. I have called his  guardian however, she was not available and has not yet called me back. I have also called [facility] and spoke to a nursing regular takes care of him. She notes that he is fairly independent and has no prior history of falls is quite impulsive.  Inpatient Medications    . acidophilus  1 capsule Oral Daily  . aspirin  81 mg Per Tube Daily  . bisacodyl  10 mg Rectal Daily  . citalopram  20 mg Per Tube Daily  . divalproex  500 mg Oral BID  . donepezil  10 mg Per Tube QHS  . enoxaparin (LOVENOX) injection  40 mg Subcutaneous Q24H  . feeding supplement (JEVITY 1.5 CAL/FIBER)  237 mL Per Tube QID  . felodipine  5 mg Oral Daily  . free water  200 mL Per Tube Q8H  . gabapentin  100 mg Oral TID  . haloperidol  0.5 mg Per Tube BID  . hydrochlorothiazide  25 mg Per Tube Daily  . memantine  14 mg Oral Daily  . metFORMIN  500 mg Per Tube Q breakfast  . QUEtiapine  25 mg Per J Tube QHS  . sodium chloride flush  3 mL Intravenous Q12H    Family History    Patient unable to provide family history though no known prior history of premature coronary artery disease.  Social History    Social History   Social History  .  Marital status: Married    Spouse name: N/A  . Number of children: N/A  . Years of education: N/A   Occupational History  . Not on file.   Social History Main Topics  . Smoking status: Former Smoker    Quit date: 09/22/1978  . Smokeless tobacco: Never Used  . Alcohol use No  . Drug use: No  . Sexual activity: Not on file   Other Topics Concern  . Not on file   Social History Narrative  . No narrative on file     Review of Systems    General:  No chills, fever, night sweats or weight changes.  Cardiovascular:  No chest pain, dyspnea on exertion, edema, orthopnea, palpitations, paroxysmal nocturnal dyspnea. Dermatological: No rash, lesions/masses Respiratory: No cough, dyspnea Urologic: No hematuria, dysuria Abdominal:   No nausea, vomiting, diarrhea,  bright red blood per rectum, melena, or hematemesis Neurologic:  No visual changes, wkns, changes in mental status. All other systems reviewed and are otherwise negative except as noted above.  Physical Exam    Blood pressure 119/63, pulse 69, temperature 97.6 F (36.4 C), resp. rate 15, weight 200 lb (90.7 kg), SpO2 95 %.  General: Pleasant, NAD Psych: flat affect.  Disoriented to time/place. Neuro: Alert and oriented X 3. Moves all extremities spontaneously. HEENT: Normal  Neck: Supple without bruits or JVD. Lungs:  Resp regular and unlabored, CTA. Heart: RRR no s3, s4, or murmurs. Abdomen: Soft, non-tender, non-distended, BS + x 4.  Extremities: No clubbing, cyanosis or edema. DP/PT/Radials 2+ and equal bilaterally.  Labs     Recent Labs  05/20/16 1837 05/20/16 2241 05/21/16 0425 05/21/16 1029  CKMB  --  3.1 2.4 1.8  TROPONINI <0.03 0.03* 0.03*  --    Lab Results  Component Value Date   WBC 7.9 05/21/2016   HGB 12.8 (L) 05/21/2016   HCT 37.3 (L) 05/21/2016   MCV 92.1 05/21/2016   PLT 156 05/21/2016    Recent Labs Lab 05/20/16 1837  05/22/16 0518  NA 135  < > 137  K 4.0  < > 3.5  CL 98*  < > 103  CO2 31  < > 27  BUN 23*  < > 18  CREATININE 0.89  < > 0.84  CALCIUM 8.7*  < > 8.7*  PROT 7.2  --   --   BILITOT 0.7  --   --   ALKPHOS 79  --   --   ALT 19  --   --   AST 30  --   --   GLUCOSE 119*  < > 113*  < > = values in this interval not displayed.   Radiology Studies    Dg Chest 2 View  Result Date: 05/20/2016 CLINICAL DATA:  Acute onset of substernal chest pain. Initial encounter. EXAM: CHEST  2 VIEW COMPARISON:  CT of the chest performed 03/26/2014 FINDINGS: The lungs are well-aerated. Chronic right-sided pleuroparenchymal scarring is noted, with right-sided volume loss. There is no evidence of pleural effusion or pneumothorax. The heart is borderline normal in size. No acute osseous abnormalities are seen. IMPRESSION: No acute cardiopulmonary process  seen. Chronic right-sided pleuroparenchymal scarring, with right-sided volume loss. Electronically Signed   By: Garald Balding M.D.   On: 05/20/2016 19:28    ECG & Cardiac Imaging    Atrial flutter, 66, right bundle branch block-personally reviewed.  Assessment & Plan    1. Atrial flutter: This is presumably new onset. He has no prior  history of atrial flutter. He was in sinus rhythm on EKG in August 2015. He is completely asymptomatic and well rate controlled without the use of AV nodal blocking agents. His CHA2DS2VASc is 3-4 in the setting of a history of hypertension, age of 32, and finding of coronary atherosclerosis on prior CT chest in August 2014. Because of his dementia, it is difficult to ascertain his ability to take anti-coagulant therapy. He does live at [facility] and therefore medications are provided for him there. I have called and spoken to a nurse who takes care of him regularly and notes that he is fairly impulsive and otherwise independent. He does not fall. There is some concern that because of his impulsive behaviors, that his risk of falls may be increased. In that setting, it may be reasonable to use aspirin only.  2. Chest pain: Patient reportedly complained of chest pain upon arrival to the emergency department. Troponins were mildly elevated at 0.03. Echo was notable for poor imaging but globally, LV function was normal. He is not currently complaining of chest pain. Not clear that he would tolerate any form of noninvasive testing in the setting of dementia. Continue conservative therapy with aspirin.    Signed, Murray Hodgkins, NP 05/22/2016, 12:56 PM

## 2016-05-23 NOTE — Telephone Encounter (Signed)
Pt is a resident at [facility]. S/w Chrys Racer, RN regarding discharge instructions.  Patient contacted regarding discharge from [facility] on April 9.  Nurse understands to follow up with Dr. Fletcher Anon on 4/23 at 2:20pm at [facility], [place]. Nurse understands discharge instructions? Yes stating intake nurse who accepted pt back to facility reviewed information. Nurse understands medications and regiment? yes Nurse understands to bring all medications to this visit? yes

## 2016-06-05 ENCOUNTER — Ambulatory Visit (INDEPENDENT_AMBULATORY_CARE_PROVIDER_SITE_OTHER): Payer: Medicare Other | Admitting: Cardiovascular Disease

## 2016-06-05 ENCOUNTER — Encounter: Payer: Self-pay | Admitting: Cardiovascular Disease

## 2016-06-05 VITALS — BP 118/62 | Ht 68.5 in | Wt 166.0 lb

## 2016-06-05 DIAGNOSIS — I4892 Unspecified atrial flutter: Secondary | ICD-10-CM | POA: Diagnosis not present

## 2016-06-05 DIAGNOSIS — I10 Essential (primary) hypertension: Secondary | ICD-10-CM | POA: Diagnosis not present

## 2016-06-05 NOTE — Patient Instructions (Signed)
Medication Instructions:  Your physician recommends that you continue on your current medications as directed. Please refer to the Current Medication list given to you today.   Labwork: none  Testing/Procedures: none  Follow-Up: Your physician recommends that you schedule a follow-up appointment as needed.    Any Other Special Instructions Will Be Listed Below (If Applicable).     If you need a refill on your cardiac medications before your next appointment, please call your pharmacy.

## 2016-06-05 NOTE — Progress Notes (Signed)
Cardiology Office Note   Date:  06/05/2016   ID:  KEIGEN CADDELL, DOB 03/07/1930, MRN [id number]  PCP:  Jacklynn Barnacle  Cardiologist:   Kathlyn Sacramento, MD   Chief Complaint  Patient presents with  . other    Follow up from [facility]; new onset A-fib. Pt. has LE edema.  Meds reviewed by the pt's med list from [facility].       History of Present Illness: Joel Summers is a 83 y.o. male who presents for A follow-up visit regarding recent hospital discharge for atrial flutter. He has known history of hypertension, lung cancer, diabetes, COPD and dementia. The patient lives at [facility] and was hospitalized recently for chest pain. Due to his dementia, he could not provide accurate history. He was found to be in rate-controlled atypical atrial flutter. Echocardiogram showed normal LV systolic function. Given his dementia and impulsive behaviors, it was decided not to anticoagulate him.  He is here for follow-up and does not endorse any complaints. The history is overall limited due to his dementia.    Past Medical History:  Diagnosis Date  . Altered mental status   . Atrial flutter ([place])    a. 05/2016 Noted on admission for c/p;  b. 05/2016 Echo: Poor windows, LV appears to be globally nl;  c. CHA2DS2VASc = 3.  . Cancer ([place])   . COPD (chronic obstructive pulmonary disease) ([place])   . Dementia   . Diabetes mellitus without complication ([place])   . Dysphagia   . Hypertension   . Lung cancer [facility])     Past Surgical History:  Procedure Laterality Date  . VIDEO BRONCHOSCOPY Bilateral 09/24/2012   Procedure: VIDEO BRONCHOSCOPY WITHOUT FLUORO;  Surgeon: [name], MD;  [facility];  Service: Cardiopulmonary;  Laterality: Bilateral;     Current Outpatient Prescriptions  Medication Sig Dispense Refill  . alum & mag hydroxide-simeth (MAALOX/MYLANTA) 200-200-20 MG/5ML suspension Take 30 mLs by mouth every 4 (four) hours as needed for indigestion.    Marland Kitchen aspirin 81  MG chewable tablet Place 1 tablet (81 mg total) into feeding tube daily. 30 tablet 0  . bisacodyl (DULCOLAX) 10 MG suppository Place 10 mg rectally daily as needed for moderate constipation.    . clonazePAM (KLONOPIN) 0.25 MG disintegrating tablet Place 1 tablet (0.25 mg total) into feeding tube 2 (two) times daily. 60 tablet 0  . divalproex (DEPAKOTE ER) 500 MG 24 hr tablet Take 1 tablet (500 mg total) by mouth 2 (two) times daily. 60 tablet 0  . donepezil (ARICEPT) 10 MG tablet Place 1 tablet (10 mg total) into feeding tube at bedtime. 30 tablet 0  . escitalopram (LEXAPRO) 5 MG tablet Take 5 mg by mouth daily.    . felodipine (PLENDIL) 5 MG 24 hr tablet Take 1 tablet (5 mg total) by mouth daily. 30 tablet 0  . haloperidol (HALDOL) 0.5 MG tablet Place 1 tablet (0.5 mg total) into feeding tube 2 (two) times daily. 60 tablet 0  . ipratropium-albuterol (DUONEB) 0.5-2.5 (3) MG/3ML SOLN Take 3 mLs by nebulization every 6 (six) hours as needed (wheezing/ dyspnea).    . lactulose (CHRONULAC) 10 GM/15ML solution Take 10 g by mouth at bedtime.    . Melatonin 3 MG TABS Take 6 mg by mouth at bedtime.    . memantine (NAMENDA) 10 MG tablet Place 1 tablet (10 mg total) into feeding tube 2 (two) times daily. 60 tablet 0  . Nutritional Supplements (  TWOCAL HN) LIQD Place 237 mLs into feeding tube 4 (four) times daily. Administer at 10:00, 14:00, 19:00, and 22:00    . QUEtiapine (SEROQUEL) 25 MG tablet Take 12.5-25 mg by mouth 2 (two) times daily. Take 12.5 mg in the morning and 25 mg at bedtime    . Water For Irrigation, Sterile (FREE WATER) SOLN Place 30 mLs into feeding tube as needed (with medication administration AND before/after bolus feeding).    . Water For Irrigation, Sterile (FREE WATER) SOLN Place 200 mLs into feeding tube every 8 (eight) hours.     No current facility-administered medications for this visit.     Allergies:   Ativan [lorazepam]    Social History:  The patient  reports that he quit  smoking about 37 years ago. He has never used smokeless tobacco. He reports that he does not drink alcohol or use drugs.   Family History:  The patient's Not able to obtain due to dementia.   ROS:  Please see the history of present illness.   Otherwise, review of systems are positive for none.   All other systems are reviewed and negative.    PHYSICAL EXAM: VS:  BP 118/62 (BP Location: Right Arm, Patient Position: Sitting, Cuff Size: Normal)   Ht 5' 8.5" (1.74 m)   Wt 166 lb (75.3 kg)   BMI 24.87 kg/m  , BMI Body mass index is 24.87 kg/m. GEN: Well nourished, well developed, in no acute distress  HEENT: normal Neck: no JVD, carotid bruits, or masses Cardiac: RRR; no murmurs, rubs, or gallops,no edema  Respiratory:  clear to auscultation bilaterally, normal work of breathing GI: soft, nontender, nondistended, + BS MS: no deformity or atrophy  Skin: warm and dry, no rash Neuro:  Strength and sensation are intact Psych: Irritable.   EKG:  EKG is ordered today. The ekg ordered today demonstrates  sinus tachycardia with right bundle branch block.   Recent Labs: 05/20/2016: ALT 19 05/21/2016: Hemoglobin 12.8; Platelets 156 05/22/2016: BUN 18; Creatinine, Ser 0.84; Magnesium 2.1; Potassium 3.5; Sodium 137; TSH 2.257    Lipid Panel No results found for: CHOL, TRIG, HDL, CHOLHDL, VLDL, LDLCALC, LDLDIRECT    Wt Readings from Last 3 Encounters:  06/05/16 166 lb (75.3 kg)  05/20/16 200 lb (90.7 kg)       No flowsheet data found.    ASSESSMENT AND PLAN:  1.  Paroxysmal atrial flutter: He appears to be in sinus rhythm today. Given his dementia and poor quality of life,  I agree that anticoagulation probably does not make a big difference in his outcome. He is on low-dose aspirin. No rate controlling medications given that his rate was controlled even when he was in atrial flutter. If he develops tachycardia, felodipine can be switched to small dose metoprolol.  2. Essential  hypertension: Blood pressure is reasonably controlled.   Disposition:   FU with me prn  Signed,  Kathlyn Sacramento, MD  06/05/2016 4:32 PM    [facility]

## 2017-10-30 ENCOUNTER — Emergency Department

## 2017-10-30 ENCOUNTER — Emergency Department
Admission: EM | Admit: 2017-10-30 | Discharge: 2017-10-30 | Disposition: A | Discharge status: Home / Self Care | Attending: Emergency Medicine | Admitting: Emergency Medicine

## 2017-10-30 DIAGNOSIS — K9423 Gastrostomy malfunction: Secondary | ICD-10-CM | POA: Insufficient documentation

## 2017-10-30 DIAGNOSIS — Z79899 Other long term (current) drug therapy: Secondary | ICD-10-CM | POA: Diagnosis not present

## 2017-10-30 DIAGNOSIS — I10 Essential (primary) hypertension: Secondary | ICD-10-CM | POA: Diagnosis not present

## 2017-10-30 DIAGNOSIS — Z7982 Long term (current) use of aspirin: Secondary | ICD-10-CM | POA: Insufficient documentation

## 2017-10-30 DIAGNOSIS — T85528A Displacement of other gastrointestinal prosthetic devices, implants and grafts, initial encounter: Secondary | ICD-10-CM

## 2017-10-30 DIAGNOSIS — Z87891 Personal history of nicotine dependence: Secondary | ICD-10-CM | POA: Insufficient documentation

## 2017-10-30 DIAGNOSIS — J449 Chronic obstructive pulmonary disease, unspecified: Secondary | ICD-10-CM | POA: Insufficient documentation

## 2017-10-30 DIAGNOSIS — Z85118 Personal history of other malignant neoplasm of bronchus and lung: Secondary | ICD-10-CM | POA: Diagnosis not present

## 2017-10-30 DIAGNOSIS — Z431 Encounter for attention to gastrostomy: Secondary | ICD-10-CM

## 2017-10-30 DIAGNOSIS — E119 Type 2 diabetes mellitus without complications: Secondary | ICD-10-CM | POA: Insufficient documentation

## 2017-10-30 DIAGNOSIS — F0390 Unspecified dementia without behavioral disturbance: Secondary | ICD-10-CM | POA: Insufficient documentation

## 2017-10-30 MED ORDER — LIDOCAINE HCL (PF) 1 % IJ SOLN
INTRAMUSCULAR | Status: AC
  Filled 2017-10-30: qty 5

## 2017-10-30 MED ORDER — IOPAMIDOL (ISOVUE-300) INJECTION 61%: 10.0000 mL | Freq: Once | INTRAVENOUS | Status: DC

## 2017-10-30 MED ORDER — LIDOCAINE HCL (PF) 1 % IJ SOLN
5.0000 mL | Freq: Once | INTRAMUSCULAR | Status: AC
  Administered 2017-10-30: 5 mL via INTRADERMAL

## 2017-10-30 MED ORDER — IOPAMIDOL (ISOVUE-300) INJECTION 61%
30.0000 mL | Freq: Once | INTRAVENOUS | Status: AC
  Administered 2017-10-30: 30 mL

## 2017-10-30 NOTE — ED Notes (Signed)
Pt transported back to [facility] at this time via [facility].

## 2017-10-30 NOTE — ED Notes (Signed)
[facility]  CALLED  FOR  TRANSPORT  TO  [facility] 

## 2017-10-30 NOTE — ED Notes (Signed)
Gtube site dressed with 4x4 guaze and ABD pad per this RN to keep patient from pulling at tube.

## 2017-10-30 NOTE — Progress Notes (Signed)
ED visit made. Patient is currently followed by [facility] [place] at [facility], with a diagnosis of Lung Cancer. He is a DNR code with out of facility DNR in place. Patient also has a legal DSS guardian.  Patient was sent to the [facility] ED by facility nursing home staff after his feeding tube was pulled out today.  Patient seen lying on the ED stretcher, alert, oriented to self, did not appear to be in any discomfort. Tube already replaced by EDP Dr. Jodell Cipro. Awaiting placement confirmation xray. Reassurance and emotional support given to patient. Plan is for discharge back to [facility]. Hospice team updated. Flo Shanks RN, BSN, [place] [facility], hospital Liaison [phone]

## 2017-10-30 NOTE — ED Notes (Signed)
Secretary notified to arrange for EMS transportation back to facility.

## 2017-10-30 NOTE — ED Triage Notes (Signed)
Pt came to ED via EMS from [facility]. Pt sent over because he pulled out his g-tube. No other complaints. Pt has dementia and is at baseline mental status. VS stable.

## 2017-10-30 NOTE — ED Provider Notes (Signed)
[facility] Emergency Department Provider Note ____________________________________________   First MD Initiated Contact with Patient 10/30/17 1443     (approximate)  I have reviewed the triage vital signs and the nursing notes.   HISTORY  Chief Complaint g-tube placement  Level 5 caveat: History of present illness limited due to dementia  HPI Joel Summers is a 84 y.o. male with a history of dementia, currently on hospice, and other PMH as noted below who presents for dislodged gastrostomy tube.  The patient is without complaints at this time.  Past Medical History:  Diagnosis Date  . Altered mental status   . Atrial flutter ([place])    a. 05/2016 Noted on admission for c/p;  b. 05/2016 Echo: Poor windows, LV appears to be globally nl;  c. CHA2DS2VASc = 3.  . Cancer ([place])   . COPD (chronic obstructive pulmonary disease) ([place])   . Dementia   . Diabetes mellitus without complication ([place])   . Dysphagia   . Hypertension   . Lung cancer [facility])     Patient Active Problem List   Diagnosis Date Noted  . Chest pain 05/20/2016  . Dementia due to Parkinson's disease without behavioral disturbance ([place]) 05/20/2016  . Acute respiratory failure with hypoxia ([place]) 09/25/2012  . Diabetic neuropathy ([place]) 09/25/2012  . Severe protein-calorie malnutrition ([place]) 09/25/2012  . COPD (chronic obstructive pulmonary disease) ([place]) 09/20/2012  . HCAP (healthcare-associated pneumonia) 09/20/2012  . Anemia 09/20/2012  . Hypokalemia 09/20/2012  . Leukocytosis 09/20/2012  . Dementia 09/20/2012  . Lung mass 09/20/2012  . DM (diabetes mellitus) ([place]) 09/20/2012  . HTN (hypertension) 09/20/2012    Past Surgical History:  Procedure Laterality Date  . VIDEO BRONCHOSCOPY Bilateral 09/24/2012   Procedure: VIDEO BRONCHOSCOPY WITHOUT FLUORO;  Surgeon: [name], MD;  [facility];  Service: Cardiopulmonary;  Laterality: Bilateral;    Prior to Admission  medications   Medication Sig Start Date End Date Taking? Authorizing Provider  alum & mag hydroxide-simeth (MAALOX/MYLANTA) 200-200-20 MG/5ML suspension Take 30 mLs by mouth every 4 (four) hours as needed for indigestion.   Yes [provider]  aspirin 81 MG chewable tablet Place 1 tablet (81 mg total) into feeding tube daily. 05/22/16  Yes Wieting, Richard, MD  bisacodyl (DULCOLAX) 10 MG suppository Place 10 mg rectally daily as needed for moderate constipation.   Yes [provider]  divalproex (DEPAKOTE SPRINKLE) 125 MG capsule 250 mg 2 (two) times daily. Per tube   Yes [provider]  escitalopram (LEXAPRO) 5 MG tablet Place 5 mg into feeding tube daily.    Yes [provider]  ipratropium-albuterol (DUONEB) 0.5-2.5 (3) MG/3ML SOLN Take 3 mLs by nebulization 3 (three) times daily.    Yes [provider]  ipratropium-albuterol (DUONEB) 0.5-2.5 (3) MG/3ML SOLN Take 3 mLs by nebulization every 6 (six) hours as needed (for COPD).   Yes [provider]  lactulose (CHRONULAC) 10 GM/15ML solution Place 10 g into feeding tube at bedtime.    Yes [provider]  loratadine (CLARITIN) 10 MG tablet Place 10 mg into feeding tube daily.   Yes [provider]  Melatonin 3 MG TABS Place 6 mg into feeding tube at bedtime.    Yes [provider]  memantine (NAMENDA) 10 MG tablet Place 1 tablet (10 mg total) into feeding tube 2 (two) times daily. 05/22/16  Yes Wieting, Richard, MD  omeprazole (PRILOSEC) 20 MG capsule 20 mg daily. Per tube   Yes [provider]  divalproex (DEPAKOTE ER) 500 MG 24 hr tablet Take 1 tablet (500 mg total) by mouth 2 (two) times daily. Patient not taking: Reported on 10/30/2017 05/22/16   Loletha Grayer, MD  donepezil (ARICEPT) 10 MG tablet Place 1 tablet (10 mg total) into feeding tube at bedtime. Patient not taking: Reported on 10/30/2017 05/22/16   Loletha Grayer, MD  felodipine (PLENDIL) 5 MG 24  hr tablet Take 1 tablet (5 mg total) by mouth daily. Patient not taking: Reported on 10/30/2017 05/22/16   Loletha Grayer, MD  haloperidol (HALDOL) 0.5 MG tablet Place 1 tablet (0.5 mg total) into feeding tube 2 (two) times daily. Patient not taking: Reported on 10/30/2017 05/22/16   Loletha Grayer, MD    Allergies Ativan [lorazepam]  No family history on file.  Social History Social History   Tobacco Use  . Smoking status: Former Smoker    Last attempt to quit: 09/22/1978    Years since quitting: 39.1  . Smokeless tobacco: Never Used  Substance Use Topics  . Alcohol use: No  . Drug use: No    Review of Systems Level 5 caveat: Unable to obtain review of systems due to dementia    ____________________________________________   PHYSICAL EXAM:  VITAL SIGNS: ED Triage Vitals [10/30/17 1429]  Enc Vitals Group     BP (!) 142/84     Pulse Rate 91     Resp 16     Temp 98.6 F (37 C)     Temp Source Oral     SpO2 96 %     Weight      Height      Head Circumference      Peak Flow      Pain Score      Pain Loc      Pain Edu?      Excl. in [place]?     Constitutional: Alert, comfortable appearing. Eyes: Conjunctivae are normal.  Head: Atraumatic. Nose: No congestion/rhinnorhea. Mouth/Throat: Mucous membranes are moist.   Neck: Normal range of motion.  Cardiovascular:  Good peripheral circulation. Respiratory: Normal respiratory effort.  No retractions. Lungs CTAB. Gastrointestinal: Soft and nontender. No distention.  Gastrostomy site clean/dry/intact. Genitourinary: No flank tenderness. Musculoskeletal:  Extremities warm and well perfused.  Neurologic:  Normal speech and language. No gross focal neurologic deficits are appreciated.  Skin:  Skin is warm and dry. No rash noted. Psychiatric: Mood and affect are normal. Speech and behavior are normal.  ____________________________________________   LABS (all labs ordered are listed, but only abnormal results are  displayed)  Labs Reviewed - No data to display ____________________________________________  EKG   ____________________________________________  RADIOLOGY  XR abdomen: Gastrostomy tube in place  ____________________________________________   PROCEDURES  Procedure(s) performed: No  Procedures  Critical Care performed: No ____________________________________________   INITIAL IMPRESSION / ASSESSMENT AND PLAN / ED COURSE  Pertinent labs & imaging results that were available during my care of the patient were reviewed by me and considered in my medical decision making (see chart for details).  84 year old male with PMH as noted above presents with dislodged gastrostomy tube.  Patient has no complaints at this time.  His vital signs are normal.  He does have some intermittent cough but no rhonchi or wheezes on lung exam.  Gastrostomy tube was replaced and sutured into place without difficulty.  X-ray confirms that it is in place.  X-ray also shows right lower lung opacity, however reviewing past x-ray from last year the patient  had a similar finding at that time.  His O2 saturation is adequate.  Given that he is in hospice and based on discussion with the hospice nurse about his goals of care, there is no indication for chest x-ray or further work-up since the patient is otherwise asymptomatic.  Patient is stable for discharge back to his facility.  ____________________________________________   FINAL CLINICAL IMPRESSION(S) / ED DIAGNOSES  Final diagnoses:  Dislodged gastrostomy tube ([place])      NEW MEDICATIONS STARTED DURING THIS VISIT:  New Prescriptions   No medications on file     Note:  This document was prepared using Dragon voice recognition software and may include unintentional dictation errors.    Arta Silence, MD 10/30/17 1627

## 2017-11-09 ENCOUNTER — Emergency Department
Admission: EM | Admit: 2017-11-09 | Discharge: 2017-11-09 | Disposition: A | Discharge status: Home / Self Care | Attending: Emergency Medicine | Admitting: Emergency Medicine

## 2017-11-09 ENCOUNTER — Other Ambulatory Visit: Payer: Self-pay

## 2017-11-09 ENCOUNTER — Encounter: Payer: Self-pay | Admitting: Emergency Medicine

## 2017-11-09 ENCOUNTER — Emergency Department

## 2017-11-09 DIAGNOSIS — Y828 Other medical devices associated with adverse incidents: Secondary | ICD-10-CM | POA: Diagnosis not present

## 2017-11-09 DIAGNOSIS — I10 Essential (primary) hypertension: Secondary | ICD-10-CM | POA: Diagnosis not present

## 2017-11-09 DIAGNOSIS — G20 Parkinson's disease: Secondary | ICD-10-CM | POA: Diagnosis not present

## 2017-11-09 DIAGNOSIS — R6889 Other general symptoms and signs: Secondary | ICD-10-CM | POA: Insufficient documentation

## 2017-11-09 DIAGNOSIS — Z85118 Personal history of other malignant neoplasm of bronchus and lung: Secondary | ICD-10-CM | POA: Diagnosis not present

## 2017-11-09 DIAGNOSIS — Z79899 Other long term (current) drug therapy: Secondary | ICD-10-CM | POA: Diagnosis not present

## 2017-11-09 DIAGNOSIS — T85598A Other mechanical complication of other gastrointestinal prosthetic devices, implants and grafts, initial encounter: Secondary | ICD-10-CM | POA: Diagnosis not present

## 2017-11-09 DIAGNOSIS — F0390 Unspecified dementia without behavioral disturbance: Secondary | ICD-10-CM | POA: Insufficient documentation

## 2017-11-09 DIAGNOSIS — E119 Type 2 diabetes mellitus without complications: Secondary | ICD-10-CM | POA: Insufficient documentation

## 2017-11-09 DIAGNOSIS — Z87891 Personal history of nicotine dependence: Secondary | ICD-10-CM | POA: Insufficient documentation

## 2017-11-09 DIAGNOSIS — Z7982 Long term (current) use of aspirin: Secondary | ICD-10-CM | POA: Insufficient documentation

## 2017-11-09 DIAGNOSIS — Z931 Gastrostomy status: Secondary | ICD-10-CM

## 2017-11-09 DIAGNOSIS — J449 Chronic obstructive pulmonary disease, unspecified: Secondary | ICD-10-CM | POA: Diagnosis not present

## 2017-11-09 MED ORDER — IOPAMIDOL (ISOVUE-300) INJECTION 61%
50.0000 mL | Freq: Once | INTRAVENOUS | Status: AC | PRN
  Administered 2017-11-09: 50 mL

## 2017-11-09 NOTE — Discharge Instructions (Addendum)
Tube replaced without issue, xray confirmation. Patient could benefit from stool softeners/laxatives

## 2017-11-09 NOTE — ED Notes (Signed)
DC instructions discussed with [place] at [facility], informed her pt would return via EMS and that an abdominal binder was loosely applied.

## 2017-11-09 NOTE — ED Triage Notes (Signed)
Pt arrived via EMS from [facility] after pt pulled out feeding tube last night.   Pt has hx of dementia at baseline. Per EMS last time PEG tube placed it was sutured in place.

## 2017-11-09 NOTE — Consult Note (Addendum)
Subjective:   CC: displaced gastrostomy tube  HPI:  Joel Summers is a 84 y.o. male who was consulted by Joel Summers for issue above.  Patient is a nursing home patient on hospice with chronic gastrostomy tube.  He has had issues with the displacement of gastrostomy tube in the past.  Today presents again with a displaced distal asked gastrostomy tube first noted approximately 24 hours ago.  History is reported by consulting provider due to patient's dementia surgery consulted because initial attempt by the ED provider to place gastrostomy tube was unsuccessful.  Past Medical History:  has a past medical history of Altered mental status, Atrial flutter ([place]), Cancer ([place]), COPD (chronic obstructive pulmonary disease) ([place]), Dementia, Diabetes mellitus without complication ([place]), Dysphagia, Hypertension, and Lung cancer ([place]).  Past Surgical History:  Past Surgical History:  Procedure Laterality Date  . VIDEO BRONCHOSCOPY Bilateral 09/24/2012   Procedure: VIDEO BRONCHOSCOPY WITHOUT FLUORO;  Surgeon: Joel Noel, MD;  Location: [facility];  Service: Cardiopulmonary;  Laterality: Bilateral;    Family History: non-contributory  Social History:  reports that he quit smoking about 39 years ago. He has never used smokeless tobacco. He reports that he does not drink alcohol or use drugs.  Current Medications: reviewed  Allergies:  Allergies as of 11/09/2017 - Review Complete 11/09/2017  Allergen Reaction Noted  . Ativan [lorazepam]  09/20/2012    ROS:  A 15 point review of systems was note performed and due to patient' dementia and inability to answer questions appropriately   Objective:     BP (!) 112/59 (BP Location: Left Arm)   Pulse 87   Temp (!) 97.4 F (36.3 C) (Oral)   Resp 16   Ht 5\' 9"  (1.753 m)   Wt 75.3 kg   SpO2 96%   BMI 24.51 kg/m   Constitutional :  alert, cooperative, appears stated age and no distress  Lymphatics/Throat:  no asymmetry, masses, or scars   Respiratory:  clear to auscultation bilaterally  Cardiovascular:  regular rate and rhythm  Gastrointestinal: soft, non-tender; bowel sounds normal; no masses,  no organomegaly and open gastrostomy tube site with minor bleeding, no active discharge..   Musculoskeletal: Steady gait and movement  Skin: Cool and moist,    Psychiatric: Normal affect, non-agitated, confused       LABS:  CMP Latest Ref Rng & Units 05/22/2016 05/21/2016 05/20/2016  Glucose 65 - 99 mg/dL 113(H) 102(H) 119(H)  BUN 6 - 20 mg/dL 18 19 23(H)  Creatinine 0.61 - 1.24 mg/dL 0.84 0.87 0.89  Sodium 135 - 145 mmol/L 137 137 135  Potassium 3.5 - 5.1 mmol/L 3.5 3.6 4.0  Chloride 101 - 111 mmol/L 103 104 98(L)  CO2 22 - 32 mmol/L 27 27 31   Calcium 8.9 - 10.3 mg/dL 8.7(L) 8.5(L) 8.7(L)  Total Protein 6.5 - 8.1 g/dL - - 7.2  Total Bilirubin 0.3 - 1.2 mg/dL - - 0.7  Alkaline Phos 38 - 126 U/L - - 79  AST 15 - 41 U/L - - 30  ALT 17 - 63 U/L - - 19   CBC Latest Ref Rng & Units 05/21/2016 05/20/2016 11/09/2014  WBC 3.8 - 10.6 K/uL 7.9 7.4 5.9  Hemoglobin 13.0 - 18.0 g/dL 12.8(L) 13.3 13.3  Hematocrit 40.0 - 52.0 % 37.3(L) 39.4(L) 38.9(L)  Platelets 150 - 440 K/uL 156 162 182    RADS: CLINICAL DATA:  G-tube replacement  EXAM: ABDOMEN - 1 VIEW  COMPARISON:  10/30/2017  FINDINGS: 50 mL of Isovue-300  contrast material was administered through the gastrostomy tube. Contrast material is seen outlining the rugal folds of the gastric antrum and first portion of the duodenum. There is a nonobstructive bowel gas pattern. Dense stool throughout the colon.  IMPRESSION: Tip of gastrostomy tube is within the stomach.   Electronically Signed   By: Ulyses Jarred M.D.   On: 11/09/2017 13:53 Assessment:   Misplaced gtube  Plan:   Probing of the gastrostomy tract gently with a Q-tip noted a patent tract extending slightly cephalad from the opening on the skin.  Several attempts by both ED provider and I with the gastrostomy  tube eventually succeeded and placing a 16 French gastrostomy tube through the skin (procedure noted as below)  Positioning confirmed to be in proper location with subsequent x-ray as noted above patient is okay to be discharged back to nursing care.  Preop diagnosis: Displaced gastrostomy tube Postop diagnosis: same Procedure: Replacement of displaced gastrostomy tube Surgeon: Joel Summers Assistant: Joel Summers EBL: Minimal Complications: None apparent  Description of procedure: Patient was laid supine on the cart in the area was prepped for reinsertion of the gastrostomy tube.  Initial attempt was unsuccessful, so gentle probing of the existing tract with a Q-tip noted a pertinent opening in the cephalad aspect.  Attempt was again made to place the 16 [place] gastrostomy tube in the angle similar to that of the tract noted on the Q-tip and the tube was noted to be inserted through the abdominal wall with minimal resistance at this angle.  The tube balloon was insufflated placed on gentle traction and the stopper pushed down onto the skin to secured in place pending radiographic imaging confirming proper placement.  Patient tolerated the procedure well

## 2017-11-09 NOTE — ED Notes (Signed)
Left message for guardian to call back re: patient. [facility] notified patient will be returning to facility.

## 2017-11-09 NOTE — ED Notes (Signed)
Returned from XR 

## 2017-11-09 NOTE — ED Notes (Signed)
Abdominal binder applied around pt loosely to maintain PEG tube placement. Dressing applied as well around PEG site, assisted by Dr. [name].

## 2017-11-09 NOTE — ED Notes (Signed)
EMS arrived to transport patient back to [facility].

## 2017-11-09 NOTE — ED Provider Notes (Signed)
[facility] Emergency Department Provider Note   ____________________________________________    I have reviewed the triage vital signs and the nursing notes.   HISTORY  Chief Complaint Feeding Tube Pulled Out   History limited by severe dementia  HPI Joel Summers is a 84 y.o. male with a history of dementia and dysphasia who has a gastric tube.  Apparently he pulled this out which he is done several times in the past.  Patient is unable to provide any history.    Past Medical History:  Diagnosis Date  . Altered mental status   . Atrial flutter ([place])    a. 05/2016 Noted on admission for c/p;  b. 05/2016 Echo: Poor windows, LV appears to be globally nl;  c. CHA2DS2VASc = 3.  . Cancer ([place])   . COPD (chronic obstructive pulmonary disease) ([place])   . Dementia   . Diabetes mellitus without complication ([place])   . Dysphagia   . Hypertension   . Lung cancer [facility])     Patient Active Problem List   Diagnosis Date Noted  . Chest pain 05/20/2016  . Dementia due to Parkinson's disease without behavioral disturbance ([place]) 05/20/2016  . Acute respiratory failure with hypoxia ([place]) 09/25/2012  . Diabetic neuropathy ([place]) 09/25/2012  . Severe protein-calorie malnutrition ([place]) 09/25/2012  . COPD (chronic obstructive pulmonary disease) ([place]) 09/20/2012  . HCAP (healthcare-associated pneumonia) 09/20/2012  . Anemia 09/20/2012  . Hypokalemia 09/20/2012  . Leukocytosis 09/20/2012  . Dementia 09/20/2012  . Lung mass 09/20/2012  . DM (diabetes mellitus) ([place]) 09/20/2012  . HTN (hypertension) 09/20/2012    Past Surgical History:  Procedure Laterality Date  . VIDEO BRONCHOSCOPY Bilateral 09/24/2012   Procedure: VIDEO BRONCHOSCOPY WITHOUT FLUORO;  Surgeon: [name], MD;  [facility];  Service: Cardiopulmonary;  Laterality: Bilateral;    Prior to Admission medications   Medication Sig Start Date End Date Taking? Authorizing Provider  alum &  mag hydroxide-simeth (MAALOX/MYLANTA) 200-200-20 MG/5ML suspension Take 30 mLs by mouth every 4 (four) hours as needed for indigestion.   Yes [provider]  aspirin 81 MG chewable tablet Place 1 tablet (81 mg total) into feeding tube daily. 05/22/16  Yes Wieting, Richard, MD  bisacodyl (DULCOLAX) 10 MG suppository Place 10 mg rectally daily as needed for moderate constipation.   Yes [provider]  divalproex (DEPAKOTE SPRINKLE) 125 MG capsule 250 mg 2 (two) times daily. Per tube   Yes [provider]  escitalopram (LEXAPRO) 5 MG tablet Place 5 mg into feeding tube daily.    Yes [provider]  ipratropium-albuterol (DUONEB) 0.5-2.5 (3) MG/3ML SOLN Take 3 mLs by nebulization 3 (three) times daily.    Yes [provider]  ipratropium-albuterol (DUONEB) 0.5-2.5 (3) MG/3ML SOLN Take 3 mLs by nebulization every 6 (six) hours as needed (for COPD).   Yes [provider]  lactulose (CHRONULAC) 10 GM/15ML solution Place 10 g into feeding tube at bedtime.    Yes [provider]  loratadine (CLARITIN) 10 MG tablet Place 10 mg into feeding tube daily.   Yes [provider]  Melatonin 3 MG TABS Place 6 mg into feeding tube at bedtime.    Yes [provider]  memantine (NAMENDA) 10 MG tablet Place 1 tablet (10 mg total) into feeding tube 2 (two) times daily. 05/22/16  Yes Wieting, Richard, MD  omeprazole (PRILOSEC) 20 MG capsule 20 mg daily. Per tube   Yes [provider]  divalproex (DEPAKOTE  ER) 500 MG 24 hr tablet Take 1 tablet (500 mg total) by mouth 2 (two) times daily. Patient not taking: Reported on 10/30/2017 05/22/16   Loletha Grayer, MD  donepezil (ARICEPT) 10 MG tablet Place 1 tablet (10 mg total) into feeding tube at bedtime. Patient not taking: Reported on 10/30/2017 05/22/16   Loletha Grayer, MD  felodipine (PLENDIL) 5 MG 24 hr tablet Take 1 tablet (5 mg total) by mouth daily. Patient not taking: Reported on  10/30/2017 05/22/16   Loletha Grayer, MD  haloperidol (HALDOL) 0.5 MG tablet Place 1 tablet (0.5 mg total) into feeding tube 2 (two) times daily. Patient not taking: Reported on 10/30/2017 05/22/16   Loletha Grayer, MD     Allergies Ativan [lorazepam]  History reviewed. No pertinent family history.  Social History Social History   Tobacco Use  . Smoking status: Former Smoker    Last attempt to quit: 09/22/1978    Years since quitting: 39.1  . Smokeless tobacco: Never Used  Substance Use Topics  . Alcohol use: No  . Drug use: No    Review of Systems-unable to obtain     ____________________________________________   PHYSICAL EXAM:  VITAL SIGNS: ED Triage Vitals  Enc Vitals Group     BP 11/09/17 1114 123/69     Pulse Rate 11/09/17 1114 89     Resp 11/09/17 1114 18     Temp 11/09/17 1114 (!) 97.4 F (36.3 C)     Temp Source 11/09/17 1114 Oral     SpO2 11/09/17 1114 95 %     Weight 11/09/17 1112 75.3 kg (166 lb 0.1 oz)     Height 11/09/17 1112 1.753 m (5\' 9" )     Head Circumference --      Peak Flow --      Pain Score --      Pain Loc --      Pain Edu? --      Excl. in [place]? --     Constitutional: Alert Eyes: Conjunctivae are normal.  Head: Atraumatic.   Cardiovascular: Normal rate, regular rhythm. Grossly normal heart sounds.  Good peripheral circulation. Respiratory: Normal respiratory effort.  No retractions.  Gastrointestinal: Soft and nontender. No distention.  G-tube insertion site noted, no erythema  Musculoskeletal:  Warm and well perfused Neurologic:   No gross focal neurologic deficits are appreciated.  Skin:  Skin is warm, dry and intact. No rash noted.   ____________________________________________   LABS (all labs ordered are listed, but only abnormal results are displayed)  Labs Reviewed - No data to display ____________________________________________  EKG  None ____________________________________________  RADIOLOGY  KUB, G-tube  appears appropriately placed ____________________________________________   PROCEDURES  Procedure(s) performed: yes    Gastrostomy tube replacement Date/Time: 11/09/2017 2:27 PM Performed by: Lavonia Drafts, MD Authorized by: Lavonia Drafts, MD  Consent: The procedure was performed in an emergent situation. Preparation: Patient was prepped and draped in the usual sterile fashion. Local anesthesia used: no  Anesthesia: Local anesthesia used: no  Sedation: Patient sedated: no  Patient tolerance: Patient tolerated the procedure well with no immediate complications      Critical Care performed: No ____________________________________________   INITIAL IMPRESSION / ASSESSMENT AND PLAN / ED COURSE  Pertinent labs & imaging results that were available during my care of the patient were reviewed by me and considered in my medical decision making (see chart for details).  Patient presented after G-tube removed, he has had this tube for some time.  Attempted to  replace it with both a 16 and 18 French tube unsuccessfully  Did ask general surgeon Dr. [name] to come down he was also unsuccessful.  However I was able to successfully insert a 12 French tube, patient tolerated well.  The nurses placed an abdominal binder to try to keep the patient from removing this    ____________________________________________   FINAL CLINICAL IMPRESSION(S) / ED DIAGNOSES  Final diagnoses:  Complaint associated with gastric tube [facility])        Note:  This document was prepared using Dragon voice recognition software and may include unintentional dictation errors.    Lavonia Drafts, MD 11/09/17 1434

## 2017-11-09 NOTE — ED Notes (Signed)
Called for transport  To [facility]  [phone]

## 2017-11-10 ENCOUNTER — Other Ambulatory Visit: Payer: Self-pay

## 2017-11-10 ENCOUNTER — Encounter: Payer: Self-pay | Admitting: Emergency Medicine

## 2017-11-10 ENCOUNTER — Emergency Department
Admission: EM | Admit: 2017-11-10 | Discharge: 2017-11-10 | Disposition: A | Discharge status: Home / Self Care | Attending: Emergency Medicine | Admitting: Emergency Medicine

## 2017-11-10 DIAGNOSIS — J449 Chronic obstructive pulmonary disease, unspecified: Secondary | ICD-10-CM | POA: Diagnosis not present

## 2017-11-10 DIAGNOSIS — T85528A Displacement of other gastrointestinal prosthetic devices, implants and grafts, initial encounter: Secondary | ICD-10-CM

## 2017-11-10 DIAGNOSIS — Z434 Encounter for attention to other artificial openings of digestive tract: Secondary | ICD-10-CM

## 2017-11-10 DIAGNOSIS — Z859 Personal history of malignant neoplasm, unspecified: Secondary | ICD-10-CM | POA: Insufficient documentation

## 2017-11-10 DIAGNOSIS — I10 Essential (primary) hypertension: Secondary | ICD-10-CM | POA: Insufficient documentation

## 2017-11-10 DIAGNOSIS — Z87891 Personal history of nicotine dependence: Secondary | ICD-10-CM | POA: Diagnosis not present

## 2017-11-10 DIAGNOSIS — K9423 Gastrostomy malfunction: Secondary | ICD-10-CM | POA: Insufficient documentation

## 2017-11-10 DIAGNOSIS — E119 Type 2 diabetes mellitus without complications: Secondary | ICD-10-CM | POA: Insufficient documentation

## 2017-11-10 DIAGNOSIS — F0390 Unspecified dementia without behavioral disturbance: Secondary | ICD-10-CM | POA: Insufficient documentation

## 2017-11-10 DIAGNOSIS — Z79899 Other long term (current) drug therapy: Secondary | ICD-10-CM | POA: Diagnosis not present

## 2017-11-10 NOTE — ED Notes (Signed)
Pt is unable to sign for D/C due to dementia.

## 2017-11-10 NOTE — ED Triage Notes (Signed)
Patient to ER via [facility] from [place] for c/o G tube not inflating properly. Patient was seen here yesterday and had new one placed after pulling G-tube out. Patient in no acute distress.

## 2017-11-10 NOTE — ED Notes (Signed)
Pt seemed to have upper airway congestion when EMS arrived to transport pt back to [facility] MD called to bedside for assessment of sx. Pt was assessed and cleared for D/C.

## 2017-11-10 NOTE — ED Notes (Signed)
Report given to Devan, RN.

## 2017-11-10 NOTE — ED Notes (Signed)
Awaiting call back from DSS to notify guardian.

## 2017-11-10 NOTE — ED Notes (Addendum)
Awaiting transport via EMS back to [facility].

## 2017-11-10 NOTE — ED Provider Notes (Signed)
[facility] Emergency Department Provider Note  Time seen: 5:00 AM  I have reviewed the triage vital signs and the nursing notes.   HISTORY  Chief Complaint GI tube problems    HPI Joel Summers is a 84 y.o. male with a past medical history of COPD, dementia, presents to the emergency department for a malfunctioning G-tube.  According to EMS staff states the G-tube balloon was not inflated and the G-tube was falling out.  It was easily to replace the G-tube but it continued to fall out so they brought the patient to the emergency department.  Patient has severe dementia cannot contribute to his history or review of systems.  Does not appear to be in any distress.   Past Medical History:  Diagnosis Date  . Altered mental status   . Atrial flutter ([place])    a. 05/2016 Noted on admission for c/p;  b. 05/2016 Echo: Poor windows, LV appears to be globally nl;  c. CHA2DS2VASc = 3.  . Cancer ([place])   . COPD (chronic obstructive pulmonary disease) ([place])   . Dementia   . Diabetes mellitus without complication ([place])   . Dysphagia   . Hypertension   . Lung cancer [facility]     Patient Active Problem List   Diagnosis Date Noted  . Chest pain 05/20/2016  . Dementia due to Parkinson's disease without behavioral disturbance ([place]) 05/20/2016  . Acute respiratory failure with hypoxia ([place]) 09/25/2012  . Diabetic neuropathy ([place]) 09/25/2012  . Severe protein-calorie malnutrition ([place]) 09/25/2012  . COPD (chronic obstructive pulmonary disease) ([place]) 09/20/2012  . HCAP (healthcare-associated pneumonia) 09/20/2012  . Anemia 09/20/2012  . Hypokalemia 09/20/2012  . Leukocytosis 09/20/2012  . Dementia 09/20/2012  . Lung mass 09/20/2012  . DM (diabetes mellitus) ([place]) 09/20/2012  . HTN (hypertension) 09/20/2012    Past Surgical History:  Procedure Laterality Date  . VIDEO BRONCHOSCOPY Bilateral 09/24/2012   Procedure: VIDEO BRONCHOSCOPY WITHOUT FLUORO;  Surgeon: [name], MD;  [facility]: [facility];  Service: Cardiopulmonary;  Laterality: Bilateral;    Prior to Admission medications   Medication Sig Start Date End Date Taking? Authorizing Provider  alum & mag hydroxide-simeth (MAALOX/MYLANTA) 200-200-20 MG/5ML suspension Take 30 mLs by mouth every 4 (four) hours as needed for indigestion.    [provider]  aspirin 81 MG chewable tablet Place 1 tablet (81 mg total) into feeding tube daily. 05/22/16   Loletha Grayer, MD  bisacodyl (DULCOLAX) 10 MG suppository Place 10 mg rectally daily as needed for moderate constipation.    [provider]  divalproex (DEPAKOTE ER) 500 MG 24 hr tablet Take 1 tablet (500 mg total) by mouth 2 (two) times daily. Patient not taking: Reported on 10/30/2017 05/22/16   Loletha Grayer, MD  divalproex (DEPAKOTE SPRINKLE) 125 MG capsule 250 mg 2 (two) times daily. Per tube    [provider]  donepezil (ARICEPT) 10 MG tablet Place 1 tablet (10 mg total) into feeding tube at bedtime. Patient not taking: Reported on 10/30/2017 05/22/16   Loletha Grayer, MD  escitalopram (LEXAPRO) 5 MG tablet Place 5 mg into feeding tube daily.     [provider]  felodipine (PLENDIL) 5 MG 24 hr tablet Take 1 tablet (5 mg total) by mouth daily. Patient not taking: Reported on 10/30/2017 05/22/16   Loletha Grayer, MD  haloperidol (HALDOL) 0.5 MG tablet Place 1 tablet (0.5 mg total) into feeding tube 2 (two) times daily. Patient not taking: Reported on 10/30/2017 05/22/16  Wieting, Richard, MD  ipratropium-albuterol (DUONEB) 0.5-2.5 (3) MG/3ML SOLN Take 3 mLs by nebulization 3 (three) times daily.     [provider]  ipratropium-albuterol (DUONEB) 0.5-2.5 (3) MG/3ML SOLN Take 3 mLs by nebulization every 6 (six) hours as needed (for COPD).    [provider]  lactulose (CHRONULAC) 10 GM/15ML solution Place 10 g into feeding tube at bedtime.     [provider]  loratadine (CLARITIN) 10 MG tablet  Place 10 mg into feeding tube daily.    [provider]  Melatonin 3 MG TABS Place 6 mg into feeding tube at bedtime.     [provider]  memantine (NAMENDA) 10 MG tablet Place 1 tablet (10 mg total) into feeding tube 2 (two) times daily. 05/22/16   Loletha Grayer, MD  omeprazole (PRILOSEC) 20 MG capsule 20 mg daily. Per tube    [provider]    Allergies  Allergen Reactions  . Ativan [Lorazepam]     No family history on file.  Social History Social History   Tobacco Use  . Smoking status: Former Smoker    Last attempt to quit: 09/22/1978    Years since quitting: 39.1  . Smokeless tobacco: Never Used  Substance Use Topics  . Alcohol use: No  . Drug use: No    Review of Systems Unable to obtain an adequate/accurate review of systems secondary to baseline dementia  ____________________________________________   PHYSICAL EXAM:  VITAL SIGNS: ED Triage Vitals [11/10/17 0454]  Enc Vitals Group     BP 122/78     Pulse Rate 79     Resp 20     Temp (!) 97.4 F (36.3 C)     Temp Source Oral     SpO2 97 %     Weight      Height      Head Circumference      Peak Flow      Pain Score      Pain Loc      Pain Edu?      Excl. in [place]?    Constitutional: Alert, lying comfortably in bed, no distress. Eyes: Normal exam ENT   Head: Normocephalic and atraumatic.   Mouth/Throat: Mucous membranes are moist. Cardiovascular: Normal rate, regular rhythm. No murmur Respiratory: Normal respiratory effort without tachypnea nor retractions. Breath sounds are clear  Gastrointestinal: Soft and nontender. No distention.  G-tube present. Musculoskeletal: Nontender with normal range of motion in all extremities. Neurologic:  No gross focal neurologic deficits  Skin:  Skin is warm, dry and intact.  Psychiatric: Mood and affect are normal.   ____________________________________________   INITIAL IMPRESSION / ASSESSMENT AND PLAN / ED COURSE  Pertinent  labs & imaging results that were available during my care of the patient were reviewed by me and considered in my medical decision making (see chart for details).  Patient presents to the emergency department for malfunctioning G-tube.  Upon my examination the G-tube is in place however the balloon is not inflated.  I removed the G-tube and inflated the balloon to ensure that the balloon inflated well and held it saline.  There did not appear to be any issue with the G-tube holding a saline.  I deflated the balloon and reinserted it and once again inflated with 10 cc of normal saline.  I flushed the G-tube with cola, no issues with flushing.  We will discharge back to his nursing facility.  ____________________________________________   FINAL CLINICAL IMPRESSION(S) / ED  DIAGNOSES  Malfunctioning G-tube    Harvest Dark, MD 11/10/17 [phone]

## 2018-01-13 DEATH — deceased
